# Patient Record
Sex: Female | Born: 1970 | Hispanic: No | Marital: Married | Smoking: Never smoker
Health system: Southern US, Community
[De-identification: ages and names within clinical notes are randomized; demographics above are authoritative.]

## PROBLEM LIST (undated history)

## (undated) ENCOUNTER — Ambulatory Visit (HOSPITAL_COMMUNITY): Admission: EM | Payer: Self-pay | Source: Home / Self Care

## (undated) DIAGNOSIS — R0602 Shortness of breath: Secondary | ICD-10-CM

## (undated) DIAGNOSIS — I881 Chronic lymphadenitis, except mesenteric: Secondary | ICD-10-CM

## (undated) DIAGNOSIS — E049 Nontoxic goiter, unspecified: Secondary | ICD-10-CM

## (undated) DIAGNOSIS — M130 Polyarthritis, unspecified: Secondary | ICD-10-CM

## (undated) DIAGNOSIS — A1889 Tuberculosis of other sites: Secondary | ICD-10-CM

## (undated) DIAGNOSIS — D649 Anemia, unspecified: Secondary | ICD-10-CM

## (undated) HISTORY — DX: Anemia, unspecified: D64.9

## (undated) HISTORY — PX: BACK SURGERY: SHX140

## (undated) HISTORY — DX: Nontoxic goiter, unspecified: E04.9

## (undated) HISTORY — DX: Polyarthritis, unspecified: M13.0

---

## 2012-01-25 ENCOUNTER — Telehealth: Payer: Self-pay | Admitting: Hematology and Oncology

## 2012-01-25 NOTE — Telephone Encounter (Signed)
S/w pt's husband today re appt for 2/26 @ 10 am w/LO. Message sent to amy/tiffany informing them of appt d/t and that LO has no more availability until wk of 2/26. If this is too long for pt to wait amy/tiffany will s/w LO or change physician as I will be out until 2/19.

## 2012-01-31 ENCOUNTER — Telehealth: Payer: Self-pay | Admitting: Hematology and Oncology

## 2012-01-31 NOTE — Telephone Encounter (Signed)
Referred by Dr. Allyne Gee Dx-Leukopenia

## 2012-02-07 ENCOUNTER — Telehealth: Payer: Self-pay | Admitting: Hematology and Oncology

## 2012-02-07 ENCOUNTER — Ambulatory Visit (HOSPITAL_BASED_OUTPATIENT_CLINIC_OR_DEPARTMENT_OTHER): Payer: Managed Care, Other (non HMO)

## 2012-02-07 ENCOUNTER — Ambulatory Visit: Payer: Managed Care, Other (non HMO) | Admitting: Lab

## 2012-02-07 ENCOUNTER — Encounter: Payer: Self-pay | Admitting: Hematology and Oncology

## 2012-02-07 ENCOUNTER — Ambulatory Visit (HOSPITAL_BASED_OUTPATIENT_CLINIC_OR_DEPARTMENT_OTHER): Payer: Managed Care, Other (non HMO) | Admitting: Hematology and Oncology

## 2012-02-07 VITALS — BP 117/85 | HR 83 | Temp 97.8°F | Ht 61.0 in | Wt 120.0 lb

## 2012-02-07 DIAGNOSIS — D72819 Decreased white blood cell count, unspecified: Secondary | ICD-10-CM

## 2012-02-07 DIAGNOSIS — E559 Vitamin D deficiency, unspecified: Secondary | ICD-10-CM | POA: Insufficient documentation

## 2012-02-07 DIAGNOSIS — E049 Nontoxic goiter, unspecified: Secondary | ICD-10-CM

## 2012-02-07 HISTORY — DX: Nontoxic goiter, unspecified: E04.9

## 2012-02-07 LAB — CBC WITH DIFFERENTIAL/PLATELET
Basophils Absolute: 0 10*3/uL (ref 0.0–0.1)
EOS%: 3.6 % (ref 0.0–7.0)
Eosinophils Absolute: 0.1 10*3/uL (ref 0.0–0.5)
HCT: 36.5 % (ref 34.8–46.6)
HGB: 12.6 g/dL (ref 11.6–15.9)
MONO#: 0.3 10*3/uL (ref 0.1–0.9)
NEUT#: 1.5 10*3/uL (ref 1.5–6.5)
NEUT%: 53.4 % (ref 38.4–76.8)
RDW: 13.6 % (ref 11.2–14.5)
lymph#: 0.9 10*3/uL (ref 0.9–3.3)

## 2012-02-07 LAB — MORPHOLOGY: PLT EST: ADEQUATE

## 2012-02-07 NOTE — Telephone Encounter (Signed)
Gv pt appt for [phone]

## 2012-02-07 NOTE — Patient Instructions (Addendum)
Reviewed CBC which are unremarkable. Continue taking vit D3 OTC and Multivitamin and f/u with PCP.  Schedule f/u in one years time

## 2012-02-07 NOTE — Progress Notes (Signed)
CC:   Robyn N. Allyne Gee, M.D.  REFERRING PHYSICIAN:  Robyn N. Allyne Gee, M.D.  IDENTIFYING PATIENT:  The patient is a 41 year old woman with seen at the request of doctor's sanders with leukopenia.  HISTORY OF PRESENT ILLNESS:  The patient is non-English speaking, from [place], who is here with her husband who acts as a Nurse, learning disability.  She has a past medical history, having been treated for a benign thyroid node nodule (adenoid goiter) in 2010.  She is not on any hormonal replacement therapy.  During a routine physical, she had lab work drawn at Dr. Zella Ball office and was felt to be leukopenic.  CBC obtained 01/13/2012 noted white count of 2.4, hemoglobin 12.8, hematocrit 37.1, and platelets 257.  The patient does not recall having ever been told that she had a problem with her blood counts.  She has not recorded any history of fever, chills, or night sweats.  She is not prone to malaria. She states she eats a well-balanced diet.  She is not taking any over- the-counter medications or herbal remedies.  She denies a history of fever, chills, or night sweats.  MEDICATIONS:  She was taking vitamin D3 and Macrobid.  ALLERGIES:  None.  SOCIAL HISTORY:  Denies alcohol, tobacco use.  She has no children.  She is a housewife.  FAMILY HISTORY:  Negative for oncologic or hematologic malignancies.  REVIEW OF SYSTEMS:  Denies fever, chills, night sweats, anorexia, or weight loss.  GI: Denies nausea, vomiting, abdominal pain, diarrhea, melena, or hematochezia.  GU: Denies dysuria, hematuria, nocturia, or frequency.  Cardiovascular: Denies chest pain, PND, orthopnea, or ankle swelling.  : Respirations denies cough, hemoptysis, wheeze, or shortness of breath.  Skin: No bruising or bleeding.  Lymph nodes: No swelling. Musculoskeletal: Denies joint aches, muscle pains.  CNS: Denies headaches, vision changes, or extremity weakness.  The rest of review of systems negative.  PHYSICAL EXAM:  General: The  patient is a well-appearing, well- nourished, woman is woman in no distress.  Vitals: Pulse 83, blood pressure 107/85, temperature 97.8, respirations 20, and 120 pounds. HEENT: Head is atraumatic, normocephalic.  Sclerae are anicteric.  Mouth moist without ulcerations, thrush, or lesions.  Neck:  Supple without adenopathy.  Chest: Demonstrates a good entry bilaterally.  Clear to both percussion and auscultation.  Abdomen:  Soft, nontender.  No hepatomegaly.  No masses.  Bowel sounds present.  Extremities:  Without edema.  Pulses present and symmetrical.  Skin: No bleeding.  CNS: Nonfocal.  LAB DATA:  The patient returned to the lab and obtained a CBC that notes a white blood cell count of 2.7, hemoglobin 12.6, hematocrit 36.5, platelets 260, and ANC 1500.  Review morphology was unremarkable.  I also reviewed with her additional lab results obtained from Dr. Zella Ball office. Specifically, a normal vitamin B12 of 552. vitamin D was low at 8.8.  TSH 1.080, free T4 1.13, alkaline phosphatase 67, ALT 9, AST 16, total and bilirubin 0.3.  BUN and creatinine 9 and 0.62, respectively, and glucose 75.  IMPRESSION AND PLAN:  Ms. Heward is a 41 year old woman with a mild leukopenia. She has adequate neutrophils of 1500.  Morphology was unremarkable.  So, with this said, she is reassured, and for the time being surveillance is all that is required. So she follows up in a year's time with a CBC.  In the interim, she is to follow up with Dr. Allyne Gee for other medical problems.     ______________________________ Laurice Record, M.D. LIO/MEDQ  D:  02/07/2012  T:  02/07/2012  Job:  [id number]

## 2012-02-07 NOTE — Progress Notes (Signed)
This office note has been dictated.

## 2012-04-30 ENCOUNTER — Encounter: Payer: Self-pay | Admitting: Internal Medicine

## 2012-04-30 ENCOUNTER — Ambulatory Visit (INDEPENDENT_AMBULATORY_CARE_PROVIDER_SITE_OTHER): Payer: BC Managed Care – PPO | Admitting: Internal Medicine

## 2012-04-30 VITALS — BP 111/75 | HR 69 | Temp 98.0°F | Resp 16 | Ht 61.5 in | Wt 122.4 lb

## 2012-04-30 DIAGNOSIS — R102 Pelvic and perineal pain: Secondary | ICD-10-CM

## 2012-04-30 DIAGNOSIS — N949 Unspecified condition associated with female genital organs and menstrual cycle: Secondary | ICD-10-CM

## 2012-04-30 DIAGNOSIS — R300 Dysuria: Secondary | ICD-10-CM

## 2012-04-30 DIAGNOSIS — N912 Amenorrhea, unspecified: Secondary | ICD-10-CM

## 2012-04-30 LAB — POCT URINALYSIS DIPSTICK
Bilirubin, UA: NEGATIVE
Glucose, UA: NEGATIVE
Nitrite, UA: NEGATIVE
Spec Grav, UA: 1.01

## 2012-04-30 LAB — POCT UA - MICROSCOPIC ONLY: Yeast, UA: NEGATIVE

## 2012-04-30 LAB — TSH: TSH: 0.836 u[IU]/mL (ref 0.350–4.500)

## 2012-04-30 LAB — HCG, QUANTITATIVE, PREGNANCY: hCG, Beta Chain, Quant, S: <2 m[IU]/mL

## 2012-04-30 NOTE — Progress Notes (Signed)
  Subjective:    Patient ID: Nichole Perry, female    DOB: 1971-03-19, 41 y.o.   MRN: [id number]  HPI amenorrhea since January 26 Feels abdomen swelling Also c/o mild dysuria Am nausea  her weight is  unchanged. No vaginal discharge Home preg test and it was negative. Pt is muslim is here with husband who is her interpreter   Review of Systems  Constitutional: Positive for activity change and appetite change.  HENT: Negative.   Eyes: Negative.   Respiratory: Negative.   Cardiovascular: Negative.   Gastrointestinal: Positive for abdominal distention.  Genitourinary: Positive for dysuria.  Musculoskeletal: Negative.   Skin: Negative.   Neurological: Negative.   Hematological: Negative.   Psychiatric/Behavioral: Negative.   All other systems reviewed and are negative.       Objective:   Physical Exam  Nursing note and vitals reviewed. Constitutional: She is oriented to person, place, and time. She appears well-developed and well-nourished.  HENT:  Head: Normocephalic and atraumatic.  Right Ear: External ear normal.  Left Ear: External ear normal.  Eyes: Conjunctivae are normal. Pupils are equal, round, and reactive to light.  Neck: Normal range of motion. Neck supple. Thyromegaly present.  Cardiovascular: Normal rate and regular rhythm.   Pulmonary/Chest: Effort normal and breath sounds normal.  Abdominal: Soft. Bowel sounds are normal. She exhibits distension. There is no tenderness. There is no rebound and no guarding.  Musculoskeletal: Normal range of motion.  Neurological: She is alert and oriented to person, place, and time.  Skin: Skin is warm and dry.  Psychiatric: She has a normal mood and affect. Her behavior is normal. Judgment and thought content normal.      Results for orders placed in visit on 04/30/12  POCT URINE PREGNANCY      Component Value Range   Preg Test, Ur Negative    POCT URINALYSIS DIPSTICK      Component Value Range   Color, UA yellow      Clarity, UA clear     Glucose, UA negative     Bilirubin, UA negative     Ketones, UA negative     Spec Grav, UA 1.010     Blood, UA trace-lysed     pH, UA 5.5     Protein, UA negative     Urobilinogen, UA 0.2     Nitrite, UA negative     Leukocytes, UA Negative    POCT UA - MICROSCOPIC ONLY      Component Value Range   WBC, Ur, HPF, POC 0-1     RBC, urine, microscopic 0-2     Bacteria, U Microscopic 1+     Mucus, UA negative     Epithelial cells, urine per micros 0-2     Crystals, Ur, HPF, POC negative     Casts, Ur, LPF, POC negative     Yeast, UA negative    neg preg and ua    Assessment & Plan:  differential diagnosis includes pregnancy /thyroid dysfunction/pelvic mass. Will check preg test and ua.  preg test is negitive and ua is negitive for infection .Will do a b hcg and tsh and pelvic ultrasound

## 2012-05-01 ENCOUNTER — Other Ambulatory Visit: Payer: Self-pay | Admitting: Internal Medicine

## 2012-05-01 DIAGNOSIS — N912 Amenorrhea, unspecified: Secondary | ICD-10-CM

## 2012-05-04 ENCOUNTER — Ambulatory Visit (HOSPITAL_COMMUNITY)
Admission: RE | Admit: 2012-05-04 | Discharge: 2012-05-04 | Disposition: A | Discharge status: Home / Self Care | Payer: BC Managed Care – PPO | Source: Ambulatory Visit | Attending: Internal Medicine | Admitting: Internal Medicine

## 2012-05-04 DIAGNOSIS — N83209 Unspecified ovarian cyst, unspecified side: Secondary | ICD-10-CM | POA: Insufficient documentation

## 2012-05-04 DIAGNOSIS — N912 Amenorrhea, unspecified: Secondary | ICD-10-CM | POA: Insufficient documentation

## 2012-05-23 ENCOUNTER — Telehealth: Payer: Self-pay

## 2012-05-23 NOTE — Telephone Encounter (Signed)
Mildly complex left ovarian cyst.   Initial short-term follow-up is recommended with reassessment in 6-8 weeks to evaluate for persistence of this finding and assess for the need for further Characterization.  If her symptoms persist in the meantime, RTC.

## 2012-05-23 NOTE — Telephone Encounter (Signed)
Can you please review the results of the ultrasound and let us know what to tell the patient?

## 2012-05-23 NOTE — Telephone Encounter (Signed)
OMER STATES HE HAD CALLED THE OTHER DAY REGARDING HIS WIFE'S ULTRA SOUND AND HASN'T HEARD FROM ANYONE PLEASE CALL [phone]

## 2012-05-24 NOTE — Telephone Encounter (Signed)
Explained results to pt's husband and instr's for f/up. He agreed to RTC in about 6 wks or sooner if Sxs persist/worsen.

## 2012-12-15 ENCOUNTER — Encounter: Payer: Self-pay | Admitting: Oncology

## 2012-12-15 ENCOUNTER — Telehealth: Payer: Self-pay | Admitting: Oncology

## 2012-12-15 NOTE — Telephone Encounter (Signed)
s.w. pt and tried to advise pt of being r/s not good english pt requested that i call back and lvm so that her husband could understand...printed appt schedule and letter...former pt of Dr. Marton Redwood reassigned to Dr. Arline Asp

## 2013-02-06 ENCOUNTER — Ambulatory Visit: Payer: Managed Care, Other (non HMO) | Admitting: Hematology and Oncology

## 2013-02-06 ENCOUNTER — Other Ambulatory Visit: Payer: Self-pay | Admitting: Oncology

## 2013-02-06 ENCOUNTER — Other Ambulatory Visit: Payer: Managed Care, Other (non HMO) | Admitting: Lab

## 2013-02-06 DIAGNOSIS — D72819 Decreased white blood cell count, unspecified: Secondary | ICD-10-CM

## 2013-02-07 ENCOUNTER — Ambulatory Visit: Payer: Managed Care, Other (non HMO) | Admitting: Oncology

## 2013-02-07 ENCOUNTER — Other Ambulatory Visit: Payer: Managed Care, Other (non HMO) | Admitting: Lab

## 2013-02-07 ENCOUNTER — Other Ambulatory Visit: Payer: Self-pay

## 2013-02-08 ENCOUNTER — Telehealth: Payer: Self-pay | Admitting: Oncology

## 2013-02-08 NOTE — Telephone Encounter (Signed)
lmonvm adviisng the pt of her march 2014 appts for the lab and md visit. Pt missed ehr feb appts

## 2013-03-04 ENCOUNTER — Other Ambulatory Visit: Payer: Self-pay | Admitting: Oncology

## 2013-03-05 ENCOUNTER — Other Ambulatory Visit (HOSPITAL_BASED_OUTPATIENT_CLINIC_OR_DEPARTMENT_OTHER): Payer: BC Managed Care – PPO

## 2013-03-05 ENCOUNTER — Encounter: Payer: Self-pay | Admitting: Medical Oncology

## 2013-03-05 ENCOUNTER — Ambulatory Visit (HOSPITAL_BASED_OUTPATIENT_CLINIC_OR_DEPARTMENT_OTHER): Payer: BC Managed Care – PPO | Admitting: Oncology

## 2013-03-05 ENCOUNTER — Encounter: Payer: Self-pay | Admitting: Oncology

## 2013-03-05 VITALS — BP 122/81 | HR 72 | Temp 97.2°F | Resp 20 | Ht 61.5 in | Wt 124.6 lb

## 2013-03-05 DIAGNOSIS — E041 Nontoxic single thyroid nodule: Secondary | ICD-10-CM

## 2013-03-05 DIAGNOSIS — D72819 Decreased white blood cell count, unspecified: Secondary | ICD-10-CM

## 2013-03-05 LAB — CBC WITH DIFFERENTIAL/PLATELET
BASO%: 0.8 % (ref 0.0–2.0)
EOS%: 2.1 % (ref 0.0–7.0)
HCT: 36.8 % (ref 34.8–46.6)
LYMPH%: 39.8 % (ref 14.0–49.7)
MCH: 29.6 pg (ref 25.1–34.0)
MCHC: 35 g/dL (ref 31.5–36.0)
NEUT%: 46.9 % (ref 38.4–76.8)
Platelets: 225 10*3/uL (ref 145–400)

## 2013-03-05 LAB — COMPREHENSIVE METABOLIC PANEL (CC13)
ALT: 10 U/L (ref 0–55)
AST: 14 U/L (ref 5–34)
Alkaline Phosphatase: 70 U/L (ref 40–150)
Creatinine: 0.6 mg/dL (ref 0.6–1.1)
Total Bilirubin: <0.2 mg/dL (ref 0.20–1.20)

## 2013-03-05 NOTE — Progress Notes (Signed)
Faxed office notes,labs from 03/05/13  and demographics to Dr. Dorothyann Peng. Dr. Arline Asp spoke with Dr. Allyne Gee today regarding pt's thyroid.

## 2013-03-05 NOTE — Progress Notes (Signed)
This office note has been dictated.  #[id number]

## 2013-03-05 NOTE — Progress Notes (Signed)
CC:   Robyn N. Allyne Gee, M.D.  PROBLEM LIST: 1. Leukopenia apparently first noted 1 year ago.  CBC on 03/05/2013:     White count 3.2, ANC 1.5, hemoglobin 12.9, hematocrit 36.8,     platelets 225,000.  This is felt to be a benign leukopenia. 2. Left lobe thyroid nodule apparently first noted in 2010.  The     patient underwent a workup either in the [place] or [place] but we do     not have any of the details of this.  Apparently she may have had a     needle biopsy. 3. History of vitamin D deficiency. 4. Infertility.  MEDICATIONS:  The patient is not on any medicines at this time.  SMOKING HISTORY:  The patient has never smoked cigarettes.  HISTORY:  I am seeing this patient for the first time.  She was seen in initial consultation by Dr. Arlan Organ on 02/07/2012 for evaluation of leukopenia.  Workup at that time including inspection of the peripheral smear was basically negative.  The patient had a normal vitamin B12 level, as well as normal thyroid function tests.  Vitamin D level apparently was 8.8.  No further workup was recommended other than just surveillance and the patient was given a return appointment for today.  In the interim apparently there have been no medical issues. The patient does not speak very much [place].  She is here today with her husband, Elaina Hoops.  He works at [employer].  He drives.  The patient was really without any complaints.  There was some concern about infertility.  Apparently they do not have any children.  Apparently they have been in the [place] for about 10 years.  Communication is through the husband.  There have been no problems with any infections.  PHYSICAL EXAMINATION:  The patient looks well.  She looks somewhat younger than her stated age of 59.  Weight is 124 pounds 9.6 ounces. Height 5 feet 1-1/2 inches.  Body surface area 1.57 sq m.  Blood pressure 122/81.  Other vital signs are normal.  There is no scleral icterus.   Mouth and pharynx are benign.  No adenopathy; however, in the left lobe of the thyroid there is a very prominent, protuberant, somewhat fluctuant smooth lesion measuring about 3.5 cm in diameter. Apparently by history this has not changed.  No axillary adenopathy. Heart and lungs are normal.  Abdomen soft, nontender with no organomegaly or masses palpable.  Extremities, no peripheral edema or clubbing.  No arthritic changes.  The patient has a well-healed scar over her lumbar spine.  Neurologic exam was normal.  LABORATORY DATA:  Today, white count 3.2, ANC 1.5, hemoglobin 12.9, hematocrit 36.8, platelets 225,000.  Red cell indices were normal. Chemistries were normal except for an albumin of 3.4.  LDH was 170.  TSH from 04/30/2012 was 0.836.  IMPRESSION AND PLAN:  I believe that the patient's mild leukopenia is a benign finding.  I agree with Dr. Dalene Carrow that this simply can be followed.  I do not think this requires any further workup at this time. Certainly if there were any significant changes in the rest of the patient's blood counts or any other findings to suggest a primary hematologic disorder then we would be happy to see the patient again.  My biggest concern is the large left thyroid nodule involving the left lobe.  I spoke with Dr. Allyne Gee.  She will contact the patient and review information that is available and  perhaps pursue other diagnostic studies to include an ultrasound, thyroid scan, perhaps referral for biopsy or surgical excision as appropriate.  I noted that the patient apparently had a low vitamin D level in the past.  She is currently not on vitamin D at this time although I believe she was taking vitamin D a year ago.  In addition, the patient's husband mentioned his concerns about fertility.  The patient may need a referral to explore the issue of infertility.  I will be happy to see the patient again should any questions or concerns arise.  A return  appointment was not made at this time.    ______________________________ Samul Dada, M.D. DSM/MEDQ  D:  03/05/2013  T:  03/05/2013  Job:  [id number]

## 2013-05-02 ENCOUNTER — Ambulatory Visit (INDEPENDENT_AMBULATORY_CARE_PROVIDER_SITE_OTHER): Payer: BC Managed Care – PPO | Admitting: Obstetrics & Gynecology

## 2013-05-02 ENCOUNTER — Encounter: Payer: Self-pay | Admitting: Obstetrics & Gynecology

## 2013-05-02 VITALS — BP 116/74 | HR 88 | Temp 98.3°F | Ht 62.0 in | Wt 120.0 lb

## 2013-05-02 DIAGNOSIS — N83209 Unspecified ovarian cyst, unspecified side: Secondary | ICD-10-CM

## 2013-05-02 DIAGNOSIS — N979 Female infertility, unspecified: Secondary | ICD-10-CM

## 2013-05-02 NOTE — Progress Notes (Signed)
Subjective:    Nichole Perry is a 42 y.o. female who presents for evaluation of infertility. Patient and partner have been attempting conception for 3 years. Marital status: married for 3 years. Pregnancies with current partner: no.  Menstrual and Endocrine History LMP Patient's last menstrual period was 04/25/2013.  Menarche 14  Shortest interval 30  Longest interval 30  days  Duration of flow 7 days  Heavy menses yes  Clots yes  Intermenstrual bleeding no  Postcoital bleeding no  Dysmenorrhea yes  Amenorrhea not applicable  Weight change unknown  Hirsutism no  Balding no  Acne no  Galactorrhea no   Obstetrical History Never pregnant  Gynecologic History Last PAP 2013  Previous abdominal or pelvic surgery no  Pelvic pain no  Endometriosis no  Hot flashes no  DES exposure unknown  Abnormal Pap no  Cervix Cryo/cone no  Sexually transmitted diseases no  Pelvic inflammatory disease no   Infertility and Endocrine Studies Basal body temperature no  Endo with biopsy no  Hysterosalpingogram no  Post-coital test no  Laparoscopy no  Hormonal studies unknown  Semen analysis no  Other studies no  Medications none  Other therapies Not applicable  Insemination not applicable   Sexual History Frequency 2 or 3 times per week  Satisfied yes  Dyspareunia no  Use of lubricant no  Douching no  Number of lifetime sex partners 1   Contraception None  Family History Thyroid problems  yes  Heart condition or high blood pressure  no  Blood clot or stroke  no  Diabetes  yes  Cancer  no  Birth defects/inherited diseases  no  Infectious diseases (mumps, TB, rubella)  no  Other medical problems  hypertension   Habits Cigarettes:    Wife -  no    Husband - no Alcohol:    Wife -  no    Husband - no Marijuana:   Wife - no   Husband - no  The following portions of the patient's history were reviewed and updated as appropriate: allergies, current medications, past  family history, past medical history, past social history, past surgical history and problem list.  Review of Systems Pertinent items are noted in HPI.  Female History and Exam Name:  Nichole Perry     Age: 41 Education: 12  Marital History: married # of years with this partner: 3 # of partners: 1  Paternity of Pregnancies: Number with this partner: 0 Number with other partners: 2 Age of youngest child: 13 years  Urologic History: Infection unknown  STD no  Mumps unknown  Varicocele no  Semen analysis unknown  Undescended testes no  Testicular trauma no  Genital surgery no  Ejaculatory problem no  Impotence no         Objective:    Female Exam BP 116/74  Pulse 88  Temp(Src) 98.3 F (36.8 C) (Oral)  Ht 5\' 2"  (1.575 m)  Wt 120 lb (54.432 kg)  BMI 21.94 kg/m2  LMP 04/25/2013 Wt Readings from Last 1 Encounters:  05/02/13 120 lb (54.432 kg)   BMI: Body mass index is 21.94 kg/(m^2). No exam today  Pelvic U/S 5/13: IMPRESSION:  Normal uterine myometrium, endometrium and right ovary.  Mildly complex left ovarian cyst. This contains a solitary thin  internal septation which does have some flow within it but no other  worrisome features are evident. Initial short-term follow-up is  recommended with reassessment in 6-8 weeks to evaluate for  persistence of this finding and  assess for the need for further  characterization.  Assessment:    Primary infertility due  H/O likely functional ovarian cyst Plan:    Basal body temperature, Progesterone and AMH.  Repeat pelvic U/S Return in a few weeks

## 2013-05-03 ENCOUNTER — Other Ambulatory Visit: Payer: Self-pay | Admitting: *Deleted

## 2013-05-03 ENCOUNTER — Encounter: Payer: Self-pay | Admitting: Obstetrics & Gynecology

## 2013-05-03 DIAGNOSIS — N979 Female infertility, unspecified: Secondary | ICD-10-CM | POA: Insufficient documentation

## 2013-05-03 DIAGNOSIS — N83209 Unspecified ovarian cyst, unspecified side: Secondary | ICD-10-CM | POA: Insufficient documentation

## 2013-05-03 NOTE — Patient Instructions (Signed)
Infertility WHAT IS INFERTILITY?  Infertility is usually defined as not being able to get pregnant after trying for one year of regular sexual intercourse without the use of contraceptives. Or not being able to carry a pregnancy to term and have a baby. The infertility rate in the [place] is around 10%. Pregnancy is the result of a chain of events. A woman must release an egg from one of her ovaries (ovulation). The egg must be fertilized by the female sperm. Then it travels through a fallopian tube into the uterus (womb), where it attaches to the wall of the uterus and grows. A man must have enough sperm, and the sperm must join with (fertilize) the egg along the way, at the proper time. The fertilized egg must then become attached to the inside of the uterus. While this may seem simple, many things can happen to prevent pregnancy from occurring.  WHOSE PROBLEM IS IT?  About 20% of infertility cases are due to problems with the man (female factors) and 65% are due to problems with the woman (female factors). Other cases are due to a combination of female and female factors or to unknown causes.  WHAT CAUSES INFERTILITY IN MEN?  Infertility in men is often caused by problems with making enough normal sperm or getting the sperm to reach the egg. Problems with sperm may exist from birth or develop later in life, due to illness or injury. Some men produce no sperm, or produce too few sperm (oligospermia). Other problems include:  Sexual dysfunction.  Hormonal or endocrine problems.  Age. Female fertility decreases with age, but not at as young an age as female fertility.  Infection.  Congenital problems. Birth defect, such as absence of the tubes that carry the sperm (vas deferens).  Genetic/chromosomal problems.  Antisperm antibody problems.  Retrograde ejaculation (sperm go into the bladder).  Varicoceles, spematoceles, or tumors of the testicles.  Lifestyle can influence the number and  quality of a man's sperm.  Alcohol and drugs can temporarily reduce sperm quality.  Environmental toxins, including pesticides and lead, may cause some cases of infertility in men. WHAT CAUSES INFERTILITY IN WOMEN?   Problems with ovulation account for most infertility in women. Without ovulation, eggs are not available to be fertilized.  Signs of problems with ovulation include irregular menstrual periods or no periods at all.  Simple lifestyle factors, including stress, diet, or athletic training, can affect a woman's hormonal balance.  Age. Fertility begins to decrease in women in the early 30s and is worse after age 37.  Much less often, a hormonal imbalance from a serious medical problem, such as a pituitary gland tumor, thyroid or other chronic medical disease, can cause ovulation problems.  Pelvic infections.  Polycystic ovary syndrome (increase in female hormones, unable to ovulate).  Alcohol or illegal drugs.  Environmental toxins, radiation, pesticides, and certain chemicals.  Aging is an important factor in female infertility.  The ability of a woman's ovaries to produce eggs declines with age, especially after age 35. About one third of couples where the woman is over 35 will have problems with fertility.  By the time she reaches menopause when her monthly periods stop for good, a woman can no longer produce eggs or become pregnant.  Other problems can also lead to infertility in women. If the fallopian tubes are blocked at one or both ends, the egg cannot travel through the tubes into the uterus. Scar tissue (adhesions) in the pelvis may cause blocked   tubes. This may result from pelvic inflammatory disease, endometriosis, or surgery for an ectopic pregnancy (fertilized egg implanted outside the uterus) or any pelvic or abdominal surgery causing adhesions.  Fibroid tumors or polyps of the uterus.  Congenital (birth defect) abnormalities of the uterus.  Infection of the  cervix (cervicitis).  Cervical stenosis (narrowing).  Abnormal cervical mucus.  Polycystic ovary syndrome.  Having sexual intercourse too often (every other day or 4 to 5 times a week).  Obesity.  Anorexia.  Poor nutrition.  Over exercising, with loss of body fat.  DES. Your mother received diethylstilbesterol hormone when pregnant with you. HOW IS INFERTILITY TESTED?  If you have been trying to have a baby without success, you may want to seek medical help. You should not wait for one year of trying before seeing a health care provider if:  You are over 35.  You have reason to believe that there may be a fertility problem. A medical evaluation may determine the reasons for a couple's infertility. Usually this process begins with:  Physical exams.  Medical histories of both partners.  Sexual histories of both partners. If there is no obvious problem, like improperly timed intercourse or absence of ovulation, tests may be needed.   For a man, testing usually begins with tests of his semen to look at:  The number of sperm.  The shape of sperm.  Movement of his sperm.  Taking a complete medical and surgical history.  Physical examination.  Check for infection of the female reproductive organs. Sometimes hormone tests are done.   For a woman, the first step in testing is to find out if she is ovulating each month. There are several ways to do this. For example, she can keep track of changes in her morning body temperature and in the texture of her cervical mucus. Another tool is a home ovulation test kit, which can be bought at drug or grocery stores.  Checks of ovulation can also be done in the doctor's office, using blood tests for hormone levels or ultrasound tests of the ovaries. If the woman is ovulating, more tests will need to be done. Some common female tests include:  Hysterosalpingogram: An x-ray of the fallopian tubes and uterus after they are injected with  dye. It shows if the tubes are open and shows the shape of the uterus.  Laparoscopy: An exam of the tubes and other female organs for disease. A lighted tube called a laparoscope is used to see inside the abdomen.  Endometrial biopsy: Sample of uterus tissue taken on the first day of the menstrual period, to see if the tissue indicates you are ovulating.  Transvaginal ultrasound: Examines the female organs.  Hysteroscopy: Uses a lighted tube to examine the cervix and inside the uterus, to see if there are any abnormalities inside the uterus. TREATMENT  Depending on the test results, different treatments can be suggested. The type of treatment depends on the cause. 85 to 90% of infertility cases are treated with drugs or surgery.   Various fertility drugs may be used for women with ovulation problems. It is important to talk with your caregiver about the drug to be used. You should understand the drug's benefits and side effects. Depending on the type of fertility drug and the dosage of the drug used, multiple births (twins or multiples) can occur in some women.  If needed, surgery can be done to repair damage to a woman's ovaries, fallopian tubes, cervix, or uterus.  Surgery   or medical treatment for endometriosis or polycystic ovary syndrome. Sometimes a man has an infertility problem that can be corrected with medicine or by surgery.  Intrauterine insemination (IUI) of sperm, timed with ovulation.  Change in lifestyle, if that is the cause (lose weight, increase exercise, and stop smoking, drinking excessively, or taking illegal drugs).  Other types of surgery:  Removing growths inside and on the uterus.  Removing scar tissue from inside of the uterus.  Fixing blocked tubes.  Removing scar tissue in the pelvis and around the female organs. WHAT IS ASSISTED REPRODUCTIVE TECHNOLOGY (ART)?  Assisted reproductive technology (ART) is another form of special methods used to help infertile  couples. ART involves handling both the woman's eggs and the man's sperm. Success rates vary and depend on many factors. ART can be expensive and time-consuming. But ART has made it possible for many couples to have children that otherwise would not have been conceived. Some methods are listed below:  In vitro fertilization (IVF). IVF is often used when a woman's fallopian tubes are blocked or when a man has low sperm counts. A drug is used to stimulate the ovaries to produce multiple eggs. Once mature, the eggs are removed and placed in a culture dish with the man's sperm for fertilization. After about 40 hours, the eggs are examined to see if they have become fertilized by the sperm and are dividing into cells. These fertilized eggs (embryos) are then placed in the woman's uterus. This bypasses the fallopian tubes.  Gamete intrafallopian transfer (GIFT) is similar to IVF, but used when the woman has at least one normal fallopian tube. Three to five eggs are placed in the fallopian tube, along with the man's sperm, for fertilization inside the woman's body.  Zygote intrafallopian transfer (ZIFT), also called tubal embryo transfer, combines IVF and GIFT. The eggs retrieved from the woman's ovaries are fertilized in the lab and placed in the fallopian tubes rather than in the uterus.  ART procedures sometimes involve the use of donor eggs (eggs from another woman) or previously frozen embryos. Donor eggs may be used if a woman has impaired ovaries or has a genetic disease that could be passed on to her baby.  When performing ART, you are at higher risk for resulting in multiple pregnancies, twins, triplets or more.  Intracytoplasma sperm injection is a procedure that injects a single sperm into the egg to fertilize it.  Embryo transplant is a procedure that starts after growing an embryo in a special media (chemical solution) developed to keep the embryo alive for 2 to 5 days, and then transplanting it  into the uterus. In cases where a cause cannot be found and pregnancy does not occur, adoption may be a consideration. Document Released: 12/01/2003 Document Revised: 02/20/2012 Document Reviewed: 10/27/2009 [employer] Patient Information 2014 [employer].  

## 2013-05-06 ENCOUNTER — Encounter: Payer: Self-pay | Admitting: Obstetrics & Gynecology

## 2013-05-13 ENCOUNTER — Other Ambulatory Visit: Payer: Self-pay | Admitting: *Deleted

## 2013-05-13 DIAGNOSIS — N83209 Unspecified ovarian cyst, unspecified side: Secondary | ICD-10-CM

## 2013-05-14 ENCOUNTER — Other Ambulatory Visit: Payer: BC Managed Care – PPO

## 2013-05-15 ENCOUNTER — Other Ambulatory Visit: Payer: BC Managed Care – PPO

## 2013-05-30 ENCOUNTER — Ambulatory Visit: Payer: BC Managed Care – PPO | Admitting: Obstetrics & Gynecology

## 2013-06-20 ENCOUNTER — Ambulatory Visit: Payer: BC Managed Care – PPO | Admitting: Obstetrics & Gynecology

## 2013-06-24 ENCOUNTER — Ambulatory Visit (INDEPENDENT_AMBULATORY_CARE_PROVIDER_SITE_OTHER): Payer: BC Managed Care – PPO | Admitting: Obstetrics & Gynecology

## 2013-06-24 ENCOUNTER — Encounter: Payer: Self-pay | Admitting: Obstetrics & Gynecology

## 2013-06-24 VITALS — BP 124/85 | HR 63 | Wt 116.0 lb

## 2013-06-24 DIAGNOSIS — IMO0002 Reserved for concepts with insufficient information to code with codable children: Secondary | ICD-10-CM

## 2013-06-24 DIAGNOSIS — N979 Female infertility, unspecified: Secondary | ICD-10-CM

## 2013-06-24 DIAGNOSIS — Z3202 Encounter for pregnancy test, result negative: Secondary | ICD-10-CM

## 2013-06-24 LAB — POCT URINE PREGNANCY: Preg Test, Ur: NEGATIVE

## 2013-06-24 NOTE — Progress Notes (Signed)
Subjective:     Nichole Perry is a 42 y.o. female here for a routine exam.  Current complaints: follow up for infertility. Pt has not had her blood work or ultrasound performed at this time. Pt states she has did her basal body temperature chart for approximately 2 weeks.   Personal health questionnaire reviewed: yes.   Gynecologic History Patient's last menstrual period was 05/23/2013. Contraception: none Last Pap: 2014. Results were: normal Last mammogram: n/a. Results were: n/a  Obstetric History OB History   Grav Para Term Preterm Abortions TAB SAB Ect Mult Living   0 0 0 0 0 0 0 0 0 0        The following portions of the patient's history were reviewed and updated as appropriate: allergies, current medications, past family history, past medical history, past social history, past surgical history and problem list.  Review of Systems Pertinent items are noted in HPI.    Objective:   No exam today  Assessment:    Primary infertility  Plan:   Basic infertility work-up Return after labs/U/S

## 2013-07-02 ENCOUNTER — Ambulatory Visit (HOSPITAL_COMMUNITY)
Admission: RE | Admit: 2013-07-02 | Discharge: 2013-07-02 | Disposition: A | Discharge status: Home / Self Care | Payer: BC Managed Care – PPO | Source: Ambulatory Visit | Attending: Obstetrics & Gynecology | Admitting: Obstetrics & Gynecology

## 2013-07-02 DIAGNOSIS — N979 Female infertility, unspecified: Secondary | ICD-10-CM | POA: Insufficient documentation

## 2013-07-02 DIAGNOSIS — D251 Intramural leiomyoma of uterus: Secondary | ICD-10-CM | POA: Insufficient documentation

## 2013-07-02 DIAGNOSIS — N83209 Unspecified ovarian cyst, unspecified side: Secondary | ICD-10-CM | POA: Insufficient documentation

## 2014-12-05 ENCOUNTER — Encounter (HOSPITAL_COMMUNITY): Payer: Self-pay | Admitting: *Deleted

## 2014-12-05 ENCOUNTER — Emergency Department (HOSPITAL_COMMUNITY)
Admission: EM | Admit: 2014-12-05 | Discharge: 2014-12-05 | Disposition: A | Discharge status: Home / Self Care | Payer: 59 | Attending: Emergency Medicine | Admitting: Emergency Medicine

## 2014-12-05 ENCOUNTER — Emergency Department (HOSPITAL_COMMUNITY): Payer: 59

## 2014-12-05 DIAGNOSIS — J029 Acute pharyngitis, unspecified: Secondary | ICD-10-CM | POA: Diagnosis present

## 2014-12-05 DIAGNOSIS — R05 Cough: Secondary | ICD-10-CM

## 2014-12-05 DIAGNOSIS — Z79899 Other long term (current) drug therapy: Secondary | ICD-10-CM | POA: Diagnosis not present

## 2014-12-05 DIAGNOSIS — Z8639 Personal history of other endocrine, nutritional and metabolic disease: Secondary | ICD-10-CM | POA: Diagnosis not present

## 2014-12-05 DIAGNOSIS — Z862 Personal history of diseases of the blood and blood-forming organs and certain disorders involving the immune mechanism: Secondary | ICD-10-CM | POA: Diagnosis not present

## 2014-12-05 DIAGNOSIS — J040 Acute laryngitis: Secondary | ICD-10-CM | POA: Diagnosis not present

## 2014-12-05 DIAGNOSIS — R059 Cough, unspecified: Secondary | ICD-10-CM

## 2014-12-05 LAB — MONONUCLEOSIS SCREEN: Mono Screen: NEGATIVE

## 2014-12-05 LAB — RAPID STREP SCREEN (MED CTR MEBANE ONLY): STREPTOCOCCUS, GROUP A SCREEN (DIRECT): NEGATIVE

## 2014-12-05 MED ORDER — ALBUTEROL SULFATE HFA 108 (90 BASE) MCG/ACT IN AERS
2.0000 | INHALATION_SPRAY | Freq: Once | RESPIRATORY_TRACT | Status: AC
  Administered 2014-12-05: 2 via RESPIRATORY_TRACT
  Filled 2014-12-05: qty 6.7

## 2014-12-05 MED ORDER — FLUTICASONE PROPIONATE 50 MCG/ACT NA SUSP: 2.0000 | Freq: Every day | NASAL | Status: DC

## 2014-12-05 MED ORDER — PREDNISONE 20 MG PO TABS
60.0000 mg | ORAL_TABLET | Freq: Once | ORAL | Status: AC
  Administered 2014-12-05: 60 mg via ORAL
  Filled 2014-12-05: qty 3

## 2014-12-05 MED ORDER — PREDNISONE 20 MG PO TABS: 40.0000 mg | ORAL_TABLET | Freq: Every day | ORAL | Status: DC

## 2014-12-05 NOTE — ED Notes (Signed)
Patient's husband interpreting for patient, declining interpreter line. Pt saw her PCP 2 weeks ago for sore throat, was given Cough medication and azythromycin.  Pt denies throat pain but is concerned that voice is still hoarse.  Pt in NAD.  No white areas or post-nasal drip noted, tonsils appear WDL.

## 2014-12-05 NOTE — Discharge Instructions (Signed)
Your strep screen, mono screen, chest x-ray negative. Take prednisone daily until all gone for inflammation. flonase for congestion. Salt water gargles. Tylenol for pain. Follow up with your doctor for recheck on Monday.    Laryngitis At the top of your windpipe is your voice box. It is the source of your voice. Inside your voice box are 2 bands of muscles called vocal cords. When you breathe, your vocal cords are relaxed and open so that air can get into the lungs. When you decide to say something, these cords come together and vibrate. The sound from these vibrations goes into your throat and comes out through your mouth as sound. Laryngitis is an inflammation of the vocal cords that causes hoarseness, cough, loss of voice, sore throat, and dry throat. Laryngitis can be temporary (acute) or long-term (chronic). Most cases of acute laryngitis improve with time.Chronic laryngitis lasts for more than 3 weeks. CAUSES Laryngitis can often be related to excessive smoking, talking, or yelling, as well as inhalation of toxic fumes and allergies. Acute laryngitis is usually caused by a viral infection, vocal strain, measles or mumps, or bacterial infections. Chronic laryngitis is usually caused by vocal cord strain, vocal cord injury, postnasal drip, growths on the vocal cords, or acid reflux. SYMPTOMS   Cough.  Sore throat.  Dry throat. RISK FACTORS  Respiratory infections.  Exposure to irritating substances, such as cigarette smoke, excessive amounts of alcohol, stomach acids, and workplace chemicals.  Voice trauma, such as vocal cord injury from shouting or speaking too loud. DIAGNOSIS  Your cargiver will perform a physical exam. During the physical exam, your caregiver will examine your throat. The most common sign of laryngitis is hoarseness. Laryngoscopy may be necessary to confirm the diagnosis of this condition. This procedure allows your caregiver to look into the larynx. HOME CARE  INSTRUCTIONS  Drink enough fluids to keep your urine clear or pale yellow.  Rest until you no longer have symptoms or as directed by your caregiver.  Breathe in moist air.  Take all medicine as directed by your caregiver.  Do not smoke.  Talk as little as possible (this includes whispering).  Write on paper instead of talking until your voice is back to normal.  Follow up with your caregiver if your condition has not improved after 10 days. SEEK MEDICAL CARE IF:   You have trouble breathing.  You cough up blood.  You have persistent fever.  You have increasing pain.  You have difficulty swallowing. MAKE SURE YOU:  Understand these instructions.  Will watch your condition.  Will get help right away if you are not doing well or get worse. Document Released: 11/28/2005 Document Revised: 02/20/2012 Document Reviewed: 02/03/2011 [facility] Patient Information 2015 [place], [place]. This information is not intended to replace advice given to you by your health care provider. Make sure you discuss any questions you have with your health care provider.

## 2014-12-05 NOTE — ED Notes (Signed)
Pt comfortable with discharge and follow up instructions. Pt declines wheelchair, escorted to waiting area by this RN. Prescriptions x2. 

## 2014-12-05 NOTE — ED Notes (Signed)
Pt. Has a sore throat that started on 12/15. Went to see a dr. That day and prescribed antibiotics and cough medicine but has not resolved.

## 2014-12-05 NOTE — ED Provider Notes (Signed)
CSN: [id number]     Arrival date & time 12/05/14  1017 History   First MD Initiated Contact with Patient 12/05/14 1118     Chief Complaint  Patient presents with  . Sore Throat     (Consider location/radiation/quality/duration/timing/severity/associated sxs/prior Treatment) HPI Nichole Perry is a 43 y.o. female the medical problems other than anemia and a goiter, presents to emergency department complaining of sore throat. Patient states she has had sore throat and cough for 2 weeks. She was seen by primary care doctor at that time, treated with Z-Pak, though some cough medication, there are [facility], states no relief. She continues to have sore throat and cough. She states her voice is hoarse. She denies any fever or chills. She denies any nasal congestion. She denies any headache, neck pain or stiffness, shortness of breath, nausea, vomiting, diarrhea. No one in the family is sick with the same.   Past Medical History  Diagnosis Date  . Anemia   . Goiter 02/07/2012   Past Surgical History  Procedure Laterality Date  . Back surgery     Family History  Problem Relation Age of Onset  . Hypertension Mother   . Diabetes Father    History  Substance Use Topics  . Smoking status: Never Smoker   . Smokeless tobacco: Never Used  . Alcohol Use: No   OB History    Gravida Para Term Preterm AB TAB SAB Ectopic Multiple Living   0 0 0 0 0 0 0 0 0 0      Review of Systems  Constitutional: Negative for fever and chills.  HENT: Positive for sore throat and voice change. Negative for congestion.   Eyes: Negative for photophobia.  Respiratory: Positive for cough. Negative for chest tightness and shortness of breath.   Cardiovascular: Negative for chest pain, palpitations and leg swelling.  Gastrointestinal: Negative for nausea, vomiting, abdominal pain and diarrhea.  Musculoskeletal: Negative for myalgias, arthralgias, neck pain and neck stiffness.  Skin: Negative for rash.   Neurological: Negative for dizziness, weakness and headaches.  All other systems reviewed and are negative.     Allergies  Review of patient's allergies indicates no known allergies.  Home Medications   Prior to Admission medications   Medication Sig Start Date End Date Taking? Authorizing Provider  VITAMIN D, CHOLECALCIFEROL, PO Take 1 capsule by mouth daily.   Yes Historical Provider, MD   BP 129/82 mmHg  Pulse 78  Temp(Src) 98 F (36.7 C) (Oral)  Resp 18  SpO2 100%  LMP 10/26/2014 Physical Exam  Constitutional: She appears well-developed and well-nourished. No distress.  HENT:  Head: Normocephalic.  Right Ear: Tympanic membrane, external ear and ear canal normal.  Left Ear: Tympanic membrane, external ear and ear canal normal.  Nose: Rhinorrhea present.  Mouth/Throat: Uvula is midline, oropharynx is clear and moist and mucous membranes are normal. No oropharyngeal exudate, posterior oropharyngeal edema, posterior oropharyngeal erythema or tonsillar abscesses.  Eyes: Conjunctivae are normal.  Neck: Neck supple.  Cardiovascular: Normal rate, regular rhythm and normal heart sounds.   Pulmonary/Chest: Effort normal and breath sounds normal. No respiratory distress. She has no wheezes. She has no rales.  Musculoskeletal: She exhibits no edema.  Neurological: She is alert.  Skin: Skin is warm and dry.  Psychiatric: She has a normal mood and affect. Her behavior is normal.  Nursing note and vitals reviewed.   ED Course  Procedures (including critical care time) Labs Review Labs Reviewed  RAPID STREP SCREEN  CULTURE,  GROUP A STREP  MONONUCLEOSIS SCREEN    Imaging Review Dg Chest 2 View  12/05/2014   CLINICAL DATA:  Sore throat, cough, lost voice about 2 weeks ago  EXAM: CHEST  2 VIEW  COMPARISON:  None.  FINDINGS: Cardiomediastinal silhouette is unremarkable. No acute infiltrate or pleural effusion. No pulmonary edema. Bony thorax is unremarkable.  IMPRESSION: No  active cardiopulmonary disease.   Electronically Signed   By: Lahoma Crocker M.D.   On: 12/05/2014 11:54     EKG Interpretation None      MDM   Final diagnoses:  Cough  Laryngitis    Patient with subtle protocol for 2 weeks. She is afebrile, nontoxic appearing. Strep, Monospot, chest x-ray negative. Vital signs within normal. Most likely viral illness with postnasal drip. Will start on Flonase, prednisone for 5 days, inhaler for cough. Follow-up with primary care doctor. Discussed plan with patient and her husband, she agrees.  Filed Vitals:   12/05/14 1024 12/05/14 1257  BP: 129/82 116/78  Pulse: 78 78  Temp: 98 F (36.7 C) 98.1 F (36.7 C)  TempSrc: Oral Oral  Resp: 18 18  SpO2: 100% 100%     Renold Genta, PA-C 12/05/14 1853  Orlie Dakin, MD 12/05/14 1943

## 2014-12-05 NOTE — ED Notes (Signed)
Patient returned from X-ray 

## 2014-12-07 LAB — CULTURE, GROUP A STREP

## 2015-02-21 ENCOUNTER — Ambulatory Visit (INDEPENDENT_AMBULATORY_CARE_PROVIDER_SITE_OTHER): Payer: 59

## 2015-02-21 ENCOUNTER — Other Ambulatory Visit (HOSPITAL_COMMUNITY): Payer: Self-pay

## 2015-02-21 ENCOUNTER — Ambulatory Visit (HOSPITAL_COMMUNITY)
Admission: RE | Admit: 2015-02-21 | Discharge: 2015-02-21 | Disposition: A | Discharge status: Home / Self Care | Payer: 59 | Source: Ambulatory Visit | Attending: Family Medicine | Admitting: Family Medicine

## 2015-02-21 ENCOUNTER — Ambulatory Visit (INDEPENDENT_AMBULATORY_CARE_PROVIDER_SITE_OTHER): Payer: 59 | Admitting: Family Medicine

## 2015-02-21 ENCOUNTER — Other Ambulatory Visit: Payer: Self-pay | Admitting: Family Medicine

## 2015-02-21 VITALS — BP 128/80 | HR 90 | Temp 98.1°F | Resp 18 | Wt 124.0 lb

## 2015-02-21 DIAGNOSIS — J90 Pleural effusion, not elsewhere classified: Secondary | ICD-10-CM

## 2015-02-21 DIAGNOSIS — R0602 Shortness of breath: Secondary | ICD-10-CM

## 2015-02-21 DIAGNOSIS — R05 Cough: Secondary | ICD-10-CM | POA: Insufficient documentation

## 2015-02-21 DIAGNOSIS — R079 Chest pain, unspecified: Secondary | ICD-10-CM | POA: Insufficient documentation

## 2015-02-21 DIAGNOSIS — E041 Nontoxic single thyroid nodule: Secondary | ICD-10-CM

## 2015-02-21 DIAGNOSIS — M791 Myalgia, unspecified site: Secondary | ICD-10-CM

## 2015-02-21 DIAGNOSIS — M25561 Pain in right knee: Secondary | ICD-10-CM | POA: Diagnosis not present

## 2015-02-21 DIAGNOSIS — M7989 Other specified soft tissue disorders: Secondary | ICD-10-CM | POA: Diagnosis not present

## 2015-02-21 DIAGNOSIS — R590 Localized enlarged lymph nodes: Secondary | ICD-10-CM

## 2015-02-21 DIAGNOSIS — M25562 Pain in left knee: Secondary | ICD-10-CM

## 2015-02-21 DIAGNOSIS — R609 Edema, unspecified: Secondary | ICD-10-CM | POA: Diagnosis not present

## 2015-02-21 LAB — POCT URINALYSIS DIPSTICK
Bilirubin, UA: NEGATIVE
Glucose, UA: NEGATIVE
Ketones, UA: 40
Nitrite, UA: NEGATIVE
Protein, UA: NEGATIVE
Spec Grav, UA: <=1.005
Urobilinogen, UA: 1
pH, UA: 5.5

## 2015-02-21 LAB — POCT UA - MICROSCOPIC ONLY
Casts, Ur, LPF, POC: NEGATIVE
Crystals, Ur, HPF, POC: NEGATIVE
YEAST UA: NEGATIVE

## 2015-02-21 LAB — POCT CBC
Granulocyte percent: 61.8 %G (ref 37–80)
HCT, POC: 38.1 % (ref 37.7–47.9)
HEMOGLOBIN: 11.8 g/dL — AB (ref 12.2–16.2)
LYMPH, POC: 1.1 (ref 0.6–3.4)
MCH: 27.4 pg (ref 27–31.2)
MCHC: 31.1 g/dL — AB (ref 31.8–35.4)
MCV: 88 fL (ref 80–97)
MID (cbc): 0.8 (ref 0–0.9)
MPV: 6.5 fL (ref 0–99.8)
PLATELET COUNT, POC: 373 10*3/uL (ref 142–424)
POC Granulocyte: 3 (ref 2–6.9)
POC LYMPH PERCENT: 22.1 %L (ref 10–50)
POC MID %: 16.1 % — AB (ref 0–12)
RBC: 4.33 M/uL (ref 4.04–5.48)
RDW, POC: 13.5 %
WBC: 4.8 10*3/uL (ref 4.6–10.2)

## 2015-02-21 LAB — HIV ANTIBODY (ROUTINE TESTING W REFLEX): HIV 1&2 Ab, 4th Generation: NONREACTIVE

## 2015-02-21 LAB — POCT INFLUENZA A/B
Influenza A, POC: NEGATIVE
Influenza B, POC: NEGATIVE

## 2015-02-21 LAB — COMPREHENSIVE METABOLIC PANEL
ALBUMIN: 3.5 g/dL (ref 3.5–5.2)
ALK PHOS: 64 U/L (ref 39–117)
AST: 14 U/L (ref 0–37)
BUN: 10 mg/dL (ref 6–23)
CO2: 23 meq/L (ref 19–32)
Calcium: 8.9 mg/dL (ref 8.4–10.5)
Chloride: 104 mEq/L (ref 96–112)
Creat: 0.61 mg/dL (ref 0.50–1.10)
GLUCOSE: 71 mg/dL (ref 70–99)
Potassium: 4 mEq/L (ref 3.5–5.3)
Sodium: 139 mEq/L (ref 135–145)
Total Bilirubin: 0.5 mg/dL (ref 0.2–1.2)
Total Protein: 6.7 g/dL (ref 6.0–8.3)

## 2015-02-21 LAB — POCT SEDIMENTATION RATE: POCT SED RATE: 65 mm/h — AB (ref 0–22)

## 2015-02-21 LAB — POCT URINE PREGNANCY: Preg Test, Ur: NEGATIVE

## 2015-02-21 LAB — T4, FREE: FREE T4: 1.28 ng/dL (ref 0.80–1.80)

## 2015-02-21 LAB — TSH: TSH: 0.664 u[IU]/mL (ref 0.350–4.500)

## 2015-02-21 MED ORDER — POTASSIUM CHLORIDE CRYS ER 20 MEQ PO TBCR: 20.0000 meq | EXTENDED_RELEASE_TABLET | Freq: Every day | ORAL | Status: DC

## 2015-02-21 MED ORDER — FUROSEMIDE 20 MG PO TABS: 20.0000 mg | ORAL_TABLET | Freq: Every day | ORAL | Status: DC

## 2015-02-21 MED ORDER — IOHEXOL 350 MG/ML SOLN
100.0000 mL | Freq: Once | INTRAVENOUS | Status: AC | PRN
  Administered 2015-02-21: 100 mL via INTRAVENOUS

## 2015-02-21 NOTE — Addendum Note (Signed)
Addended by: Wardell Honour on: 02/21/2015 04:26 PM   Modules accepted: Orders

## 2015-02-21 NOTE — Addendum Note (Signed)
Addended by: Orion Crook on: 02/21/2015 05:32 PM   Modules accepted: Orders

## 2015-02-21 NOTE — Addendum Note (Signed)
Addended by: Wardell Honour on: 02/21/2015 05:16 PM   Modules accepted: Orders

## 2015-02-21 NOTE — Progress Notes (Addendum)
Subjective:    Patient ID: Nichole Perry, female    DOB: 1971/05/03, 44 y.o.   MRN: [id number]  02/21/2015  Foot Swelling and Knee Pain   HPI This 44 y.o. female presents with husband for evaluation of B knee pain and B feet swelling and SOB and cough.  Onset of symptoms three days ago.  No fever/chills/sweats.  +Sharp shooting chest pain intermittently and radiates to back upper; duration seconds only; no exertional component.  +SOB with exertion with walking up the stairs for past three days.  +Coughing for three days.  No nausea or vomiting; +diarrhea today only.  No sore throat.  No rhinorrhea. No ear pain.  No swelling in hands or abdomen.  +B knee pain; pain with walking.  +myalgias in arms and upper back. Last menstrual period 10/20/2014.  History of irregular menses.    History of thyroid nodule in 2010 with uptake scan; +cold nodule with overactive thyroid consistent of Graves disease; this work up occurred in [place].  Patient has declined surgical resection of thyroid nodule for years; afraid of surgical complications.  Last thyroid US 2010; no enlargement of nodule since 2010 yet now having choking sensations when bends over only.    PCP: Dr. Baird Cancer; last office visit less than one year ago.  Works for [employer] second shift.  Review of Systems  Constitutional: Negative for fever, chills, diaphoresis and fatigue.  HENT: Negative for congestion, ear pain, postnasal drip, rhinorrhea and sore throat.   Respiratory: Positive for cough and shortness of breath. Negative for wheezing.   Cardiovascular: Positive for chest pain and leg swelling. Negative for palpitations.  Gastrointestinal: Negative for nausea, vomiting, abdominal pain and diarrhea.  Musculoskeletal: Positive for myalgias and arthralgias.  Skin: Negative for rash.  Neurological: Positive for headaches. Negative for dizziness and light-headedness.    Past Medical History  Diagnosis Date  . Anemia   . Goiter  02/07/2012   Past Surgical History  Procedure Laterality Date  . Back surgery     No Known Allergies Current Outpatient Prescriptions  Medication Sig Dispense Refill  . VITAMIN D, CHOLECALCIFEROL, PO Take 1 capsule by mouth daily.    . furosemide (LASIX) 20 MG tablet Take 1 tablet (20 mg total) by mouth daily. 30 tablet 0  . potassium chloride SA (K-DUR,KLOR-CON) 20 MEQ tablet Take 1 tablet (20 mEq total) by mouth daily. 20 tablet 0   No current facility-administered medications for this visit.       Objective:    BP 128/80 mmHg  Pulse 90  Temp(Src) 98.1 F (36.7 C) (Oral)  Resp 18  Wt 124 lb (56.246 kg)  SpO2 100%  LMP 10/20/2014 Physical Exam  Constitutional: She is oriented to person, place, and time. She appears well-developed and well-nourished. No distress.  HENT:  Head: Normocephalic and atraumatic.  Right Ear: External ear normal.  Left Ear: External ear normal.  Nose: Nose normal.  Mouth/Throat: Oropharynx is clear and moist.  Eyes: Conjunctivae and EOM are normal. Pupils are equal, round, and reactive to light.  Neck: Normal range of motion. Neck supple. Carotid bruit is not present. Thyroid mass present. No thyromegaly present.    Large palpable thyroid nodule on L.  Cardiovascular: Normal rate, regular rhythm, normal heart sounds and intact distal pulses.  Exam reveals no gallop and no friction rub.   No murmur heard. Pulmonary/Chest: Effort normal and breath sounds normal. She has no wheezes. She has no rales.  Abdominal: Soft. Bowel sounds are  normal. She exhibits no distension and no mass. There is no tenderness. There is no rebound and no guarding.  Lymphadenopathy:    She has no cervical adenopathy.  Neurological: She is alert and oriented to person, place, and time. No cranial nerve deficit.  Skin: Skin is warm and dry. No rash noted. She is not diaphoretic. No erythema. No pallor.  Psychiatric: She has a normal mood and affect. Her behavior is normal.    Results for orders placed or performed in visit on 02/21/15  POCT CBC  Result Value Ref Range   WBC 4.8 4.6 - 10.2 K/uL   Lymph, poc 1.1 0.6 - 3.4   POC LYMPH PERCENT 22.1 10 - 50 %L   MID (cbc) 0.8 0 - 0.9   POC MID % 16.1 (A) 0 - 12 %M   POC Granulocyte 3.0 2 - 6.9   Granulocyte percent 61.8 37 - 80 %G   RBC 4.33 4.04 - 5.48 M/uL   Hemoglobin 11.8 (A) 12.2 - 16.2 g/dL   HCT, POC 38.1 37.7 - 47.9 %   MCV 88.0 80 - 97 fL   MCH, POC 27.4 27 - 31.2 pg   MCHC 31.1 (A) 31.8 - 35.4 g/dL   RDW, POC 13.5 %   Platelet Count, POC 373 142 - 424 K/uL   MPV 6.5 0 - 99.8 fL  POCT Influenza A/B  Result Value Ref Range   Influenza A, POC Negative    Influenza B, POC Negative   POCT urine pregnancy  Result Value Ref Range   Preg Test, Ur Negative   POCT urinalysis dipstick  Result Value Ref Range   Color, UA yellow    Clarity, UA cloudy    Glucose, UA neg    Bilirubin, UA neg    Ketones, UA 40    Spec Grav, UA <=1.005    Blood, UA trace    pH, UA 5.5    Protein, UA neg    Urobilinogen, UA 1.0    Nitrite, UA neg    Leukocytes, UA small (1+)   POCT UA - Microscopic Only  Result Value Ref Range   WBC, Ur, HPF, POC 10-15    RBC, urine, microscopic 2-4    Bacteria, U Microscopic 2+    Mucus, UA 1+    Epithelial cells, urine per micros 15-20    Crystals, Ur, HPF, POC neg    Casts, Ur, LPF, POC neg    Yeast, UA neg    [facility] reading (PRIMARY) by  Dr. Tamala Julian.  CXR: +infiltrate bibasilar  EKG: NSR; NO ACUTE CHANGES.    Assessment & Plan:   1. Myalgia   2. Leg swelling   3. Bilateral knee pain   4. Thyroid nodule   5. SOB (shortness of breath)   6. Chest pain, unspecified chest pain type     1.  Leg swelling:  New. Ddx include venous stasis, myxedema, CHF, renal failure.  Obtain labs.  Treat with Lasix 20mg  daily, KCL 8meq daily.  If no improvement in one week, RTC.  If thyroid labs normal, refer to cardiology to rule out cardiac etiology.  Associated with pleural  effusions. 2.  B knee pain: New.  Associated leg swelling.  Full range of motion of knees without reproducible pain; feel that knee pain due to LE edema. 3.  Thyroid nodule: New to this provider; chronic for patient; refer to thyroid US; pt reluctant to undergo resection despite intermittent choking sensation.  Obtain labs.  4.  SOB:  New.  Exertional; onset in past 3 days with lower extremity edema; obtain labs; CXR with pleural effusions; EKG normal; no respiratory distess; RTC for acute worsening.  Warrants CT chest to evaluate for PE, mass, infectious process, etc.  Obtain Quantiferon Gold, HIV.   5.  Chest pain: New. Atypical in nature yet associated with DOE, leg swelling.  If worsens/recurs, to ED.   Meds ordered this encounter  Medications  . furosemide (LASIX) 20 MG tablet    Sig: Take 1 tablet (20 mg total) by mouth daily.    Dispense:  30 tablet    Refill:  0  . potassium chloride SA (K-DUR,KLOR-CON) 20 MEQ tablet    Sig: Take 1 tablet (20 mEq total) by mouth daily.    Dispense:  20 tablet    Refill:  0    No Follow-up on file.     Kristi Elayne Guerin, M.D. [facility] [address] [place], [place]  [address] [phone] phone [phone] fax

## 2015-02-23 LAB — THYROGLOBULIN LEVEL: Thyroglobulin: 78.9 ng/mL — ABNORMAL HIGH (ref 2.8–40.9)

## 2015-02-24 NOTE — Addendum Note (Signed)
Addended by: Wardell Honour on: 02/24/2015 03:04 PM   Modules accepted: Orders

## 2015-02-25 ENCOUNTER — Ambulatory Visit
Admission: RE | Admit: 2015-02-25 | Discharge: 2015-02-25 | Disposition: A | Discharge status: Home / Self Care | Payer: 59 | Source: Ambulatory Visit | Attending: Family Medicine | Admitting: Family Medicine

## 2015-02-25 DIAGNOSIS — E041 Nontoxic single thyroid nodule: Secondary | ICD-10-CM

## 2015-02-26 ENCOUNTER — Other Ambulatory Visit: Payer: Self-pay | Admitting: Family Medicine

## 2015-02-26 DIAGNOSIS — J90 Pleural effusion, not elsewhere classified: Secondary | ICD-10-CM

## 2015-02-27 ENCOUNTER — Telehealth: Payer: Self-pay

## 2015-02-27 NOTE — Telephone Encounter (Signed)
Spoke with patient's husband at her request. Explained that nodules were found on ultrasound and that biopsy under ultrasound guidance is necessary.  Patient's husband voiced understanding and she will present for appointment. Philis Fendt, MS, PA-C   4:16 PM, 02/27/2015

## 2015-02-27 NOTE — Telephone Encounter (Signed)
Patient is concerned about why they are going for another ultrasound. They state they were never contacted about the results of the ultrasound from 02/25/15. Can someone please give the patients husband a call with the results.

## 2015-03-02 ENCOUNTER — Other Ambulatory Visit: Payer: Self-pay | Admitting: Family Medicine

## 2015-03-02 DIAGNOSIS — R590 Localized enlarged lymph nodes: Secondary | ICD-10-CM

## 2015-03-02 DIAGNOSIS — J90 Pleural effusion, not elsewhere classified: Secondary | ICD-10-CM

## 2015-03-03 ENCOUNTER — Ambulatory Visit (HOSPITAL_COMMUNITY)
Admission: RE | Admit: 2015-03-03 | Discharge: 2015-03-03 | Disposition: A | Discharge status: Home / Self Care | Payer: 59 | Source: Ambulatory Visit | Attending: Diagnostic Radiology | Admitting: Diagnostic Radiology

## 2015-03-03 ENCOUNTER — Ambulatory Visit (HOSPITAL_COMMUNITY)
Admission: RE | Admit: 2015-03-03 | Discharge: 2015-03-03 | Disposition: A | Discharge status: Home / Self Care | Payer: 59 | Source: Ambulatory Visit | Attending: Radiology | Admitting: Radiology

## 2015-03-03 ENCOUNTER — Emergency Department (HOSPITAL_COMMUNITY)
Admission: EM | Admit: 2015-03-03 | Discharge: 2015-03-03 | Disposition: A | Discharge status: Other Acute Inpatient Hospital | Payer: 59

## 2015-03-03 DIAGNOSIS — Z9889 Other specified postprocedural states: Secondary | ICD-10-CM

## 2015-03-03 DIAGNOSIS — J90 Pleural effusion, not elsewhere classified: Secondary | ICD-10-CM | POA: Insufficient documentation

## 2015-03-03 LAB — BODY FLUID CELL COUNT WITH DIFFERENTIAL
EOS FL: 8 %
Lymphs, Fluid: 40 %
Monocyte-Macrophage-Serous Fluid: 24 % — ABNORMAL LOW (ref 50–90)
Neutrophil Count, Fluid: 28 % — ABNORMAL HIGH (ref 0–25)
Total Nucleated Cell Count, Fluid: 3000 cu mm — ABNORMAL HIGH (ref 0–1000)

## 2015-03-03 LAB — LACTATE DEHYDROGENASE, PLEURAL OR PERITONEAL FLUID: LD FL: 253 U/L — AB (ref 3–23)

## 2015-03-03 LAB — GLUCOSE, SEROUS FLUID: GLUCOSE FL: 104 mg/dL

## 2015-03-03 LAB — PROTEIN, BODY FLUID: TOTAL PROTEIN, FLUID: 4.4 g/dL

## 2015-03-03 NOTE — ED Notes (Signed)
Called urgent care. Pt has appointment at radiology at 3pm, accidentally checked in wrong department

## 2015-03-03 NOTE — Procedures (Signed)
US guided diagnostic/therapeutic right thoracentesis performed yielding 360 cc's slightly turbid, yellow fluid. The fluid was sent to the lab for preordered studies. F/u CXR pending. No immediate complications.

## 2015-03-04 LAB — PH, BODY FLUID: PH, FLUID: 8

## 2015-03-05 ENCOUNTER — Ambulatory Visit (INDEPENDENT_AMBULATORY_CARE_PROVIDER_SITE_OTHER): Payer: 59

## 2015-03-05 ENCOUNTER — Ambulatory Visit (INDEPENDENT_AMBULATORY_CARE_PROVIDER_SITE_OTHER): Payer: 59 | Admitting: Family Medicine

## 2015-03-05 VITALS — BP 90/60 | Temp 99.1°F | Resp 16 | Ht 62.0 in | Wt 118.0 lb

## 2015-03-05 DIAGNOSIS — E041 Nontoxic single thyroid nodule: Secondary | ICD-10-CM

## 2015-03-05 DIAGNOSIS — N912 Amenorrhea, unspecified: Secondary | ICD-10-CM

## 2015-03-05 DIAGNOSIS — R590 Localized enlarged lymph nodes: Secondary | ICD-10-CM

## 2015-03-05 DIAGNOSIS — R509 Fever, unspecified: Secondary | ICD-10-CM

## 2015-03-05 DIAGNOSIS — J90 Pleural effusion, not elsewhere classified: Secondary | ICD-10-CM | POA: Diagnosis not present

## 2015-03-05 DIAGNOSIS — R600 Localized edema: Secondary | ICD-10-CM

## 2015-03-05 DIAGNOSIS — M2550 Pain in unspecified joint: Secondary | ICD-10-CM

## 2015-03-05 LAB — AMYLASE, PLEURAL FLUID: Amylase, Pleural Fluid: 26 U/L

## 2015-03-05 LAB — POCT CBC
Granulocyte percent: 57.9 %G (ref 37–80)
HEMATOCRIT: 36.6 % — AB (ref 37.7–47.9)
Hemoglobin: 11.3 g/dL — AB (ref 12.2–16.2)
Lymph, poc: 1.5 (ref 0.6–3.4)
MCH, POC: 26.9 pg — AB (ref 27–31.2)
MCHC: 30.8 g/dL — AB (ref 31.8–35.4)
MCV: 87.2 fL (ref 80–97)
MID (cbc): 0.4 (ref 0–0.9)
MPV: 6.8 fL (ref 0–99.8)
POC GRANULOCYTE: 2.5 (ref 2–6.9)
POC LYMPH %: 34.1 % (ref 10–50)
POC MID %: 8 %M (ref 0–12)
Platelet Count, POC: 695 10*3/uL — AB (ref 142–424)
RBC: 4.2 M/uL (ref 4.04–5.48)
RDW, POC: 13.8 %
WBC: 4.4 10*3/uL — AB (ref 4.6–10.2)

## 2015-03-05 LAB — POCT URINE PREGNANCY: PREG TEST UR: NEGATIVE

## 2015-03-05 NOTE — Progress Notes (Addendum)
Subjective:   This chart was scribed for Nichole Forts, MD by Erling Conte, ED Scribe. This patient was seen in Room 10 and the patient's care was started at 8:46 PM.   Patient ID: Nichole Perry, female    DOB: [birth year], 44 y.o.   MRN: [id number]  03/05/2015  Follow-up and Knee Pain  HPI  HPI Comments: Nichole Perry is a 44 y.o. female who presents to the [facility] for twelve day f/u of bilateral pleural effusions, B leg swelling. Dr. Tamala Julian saw pt 10 days ago with SOB/DOE, B knee pain, and B leg swelling. She had a CXR that showed fluid/effusions on her lungs; EKG was WNL. Pt send for CT scan of lungs which showed large pleural effusions, no evidence of pulmonary embolism, 4.5cm L thyroid nodule, and R axillary LAD.  She is s/p thoracentesis 2 days ago; tolerated procedure well.  She has enlarged lymph nodes in right axilla and is waiting to be scheduled for diagnostic mammogram.  Since last visit, pt is having swelling and pain of bilateral hands. +stiffness every morning in hands.   She denies problems with extremity swelling since starting Lasix for leg swelling.  The previous swelling in her lower legs has improved.   She reports she is still having ongoing knee pain which hurts to walk.  Pain is located in posterior knees.  Denies shoulder pain, hip pain, back pain.     Pt husband states that the pt has been spiking a fever at night; they have not been checking temperature at home. She also has a mild cough when leaning over and began when she started having the leg swelling.  Pt feels that thyroid nodule also contributes to cough.  No sputum production or hemoptysis.  No contacts with tuberculosis that she is aware of; pt is from [place].  ESR 65 at last visit.  HIV negative.  Tb Gold unable to be completed.  No family history of lupus or rheumatoid arthritis.   Pt is also complaining that the nipple of her right breast is hard compared to her left. Pt has taken  medicine for her goiter in the past in [place] with no improvement.  S/p thyroid biopsy ten years ago in [place]; pathology benign. Pt is now agreeable to thyroid nodule resection due to large size.   TSH and free T4 WNL at last visit; thyroglobulin level elevated.  She denies any HA, otalgia, dizziness, sore throat, abdominal pain,or SOB or orthopnea.  Appetite is decreased.  Denies n/v/d/c.    Review of Systems  Constitutional: Positive for fever, chills and fatigue. Negative for diaphoresis.  HENT: Negative for congestion, ear pain, postnasal drip, rhinorrhea, sinus pressure, sneezing and sore throat.   Respiratory: Positive for cough. Negative for shortness of breath, wheezing and stridor.   Cardiovascular: Negative for chest pain, palpitations and leg swelling.  Gastrointestinal: Negative for nausea, abdominal pain, diarrhea and abdominal distention.  Genitourinary: Negative for dysuria, urgency, frequency and flank pain.  Musculoskeletal: Positive for joint swelling, arthralgias and gait problem. Negative for back pain, neck pain and neck stiffness.  Skin: Negative for rash.  Neurological: Negative for dizziness, facial asymmetry and headaches.    Past Medical History  Diagnosis Date  . Anemia   . Goiter 02/07/2012   Past Surgical History  Procedure Laterality Date  . Back surgery     No Known Allergies Current Outpatient Prescriptions  Medication Sig Dispense Refill  . furosemide (LASIX) 20 MG tablet Take  1 tablet (20 mg total) by mouth daily. 30 tablet 0  . potassium chloride SA (K-DUR,KLOR-CON) 20 MEQ tablet Take 1 tablet (20 mEq total) by mouth daily. (Patient not taking: Reported on 03/05/2015) 20 tablet 0  . VITAMIN D, CHOLECALCIFEROL, PO Take 1 capsule by mouth daily.     No current facility-administered medications for this visit.       Objective:    Triage Vitals: BP 90/60 mmHg  Temp(Src) 99.1 F (37.3 C) (Oral)  Resp 16  Ht 5' 2" (1.575 m)  Wt 118 lb (53.524 kg)   BMI 21.58 kg/m2  LMP 10/20/2014 (LMP Unknown)  Physical Exam  Constitutional: She is oriented to person, place, and time. She appears well-developed and well-nourished.  Non-toxic appearance. She appears ill. No distress.  HENT:  Head: Normocephalic and atraumatic.  Right Ear: External ear normal.  Left Ear: External ear normal.  Nose: Nose normal.  Mouth/Throat: Oropharynx is clear and moist.  Eyes: Conjunctivae and EOM are normal. Pupils are equal, round, and reactive to light.  Neck: Neck supple. No tracheal deviation present. Thyromegaly present.  Large nodule of left thyroid  Cardiovascular: Normal rate, regular rhythm and normal heart sounds.  Exam reveals no gallop and no friction rub.   No murmur heard. No LE edema.  Pulmonary/Chest: Effort normal. No respiratory distress. She has no wheezes. She has rales in the right lower field and the left lower field. Right breast exhibits no inverted nipple, no mass, no nipple discharge, no skin change and no tenderness. Left breast exhibits no inverted nipple, no mass, no nipple discharge, no skin change and no tenderness. Breasts are symmetrical.  Abdominal: Soft. Bowel sounds are normal. She exhibits no distension and no mass. There is no tenderness. There is no rebound and no guarding.  Genitourinary:  Normal breast exam  Musculoskeletal: Normal range of motion. She exhibits no edema.       Right knee: She exhibits swelling and effusion. She exhibits normal range of motion. No tenderness found. No medial joint line, no lateral joint line and no patellar tendon tenderness noted.       Left knee: She exhibits swelling and effusion. She exhibits normal range of motion. No tenderness found. No medial joint line and no lateral joint line tenderness noted.       Right hand: She exhibits bony tenderness and swelling.       Left hand: She exhibits bony tenderness and swelling.  Diffuse swelling of bilateral hands with decreased ROM. Swelling of  posterior knees but no edema in legs.   Lymphadenopathy:    She has no cervical adenopathy.    She has no axillary adenopathy.       Right axillary: No pectoral and no lateral adenopathy present.       Left axillary: No pectoral and no lateral adenopathy present. Neurological: She is alert and oriented to person, place, and time. No cranial nerve deficit. She exhibits normal muscle tone. Coordination normal.  Skin: Skin is warm and dry. Rash noted. She is not diaphoretic.  Psychiatric: She has a normal mood and affect. Her behavior is normal.  Nursing note and vitals reviewed.  Results for orders placed or performed in visit on 03/05/15  Comprehensive metabolic panel  Result Value Ref Range   Sodium 138 135 - 145 mEq/L   Potassium 4.4 3.5 - 5.3 mEq/L   Chloride 100 96 - 112 mEq/L   CO2 22 19 - 32 mEq/L   Glucose, Bld 99  70 - 99 mg/dL   BUN 8 6 - 23 mg/dL   Creat 0.62 0.50 - 1.10 mg/dL   Total Bilirubin 0.5 0.2 - 1.2 mg/dL   Alkaline Phosphatase 63 39 - 117 U/L   AST 60 (H) 0 - 37 U/L   ALT 43 (H) 0 - 35 U/L   Total Protein 6.8 6.0 - 8.3 g/dL   Albumin 3.2 (L) 3.5 - 5.2 g/dL   Calcium 9.0 8.4 - 10.5 mg/dL  Lactate Dehydrogenase  Result Value Ref Range   LDH 187 94 - 250 U/L  ANA  Result Value Ref Range   Anit Nuclear Antibody(ANA)  NEGATIVE  Rheumatoid factor  Result Value Ref Range   Rhuematoid fact SerPl-aCnc 11 <=14 IU/mL  Sedimentation rate  Result Value Ref Range   Sed Rate 105 (H) 0 - 20 mm/hr  POCT CBC  Result Value Ref Range   WBC 4.4 (A) 4.6 - 10.2 K/uL   Lymph, poc 1.5 0.6 - 3.4   POC LYMPH PERCENT 34.1 10 - 50 %L   MID (cbc) 0.4 0 - 0.9   POC MID % 8.0 0 - 12 %M   POC Granulocyte 2.5 2 - 6.9   Granulocyte percent 57.9 37 - 80 %G   RBC 4.20 4.04 - 5.48 M/uL   Hemoglobin 11.3 (A) 12.2 - 16.2 g/dL   HCT, POC 36.6 (A) 37.7 - 47.9 %   MCV 87.2 80 - 97 fL   MCH, POC 26.9 (A) 27 - 31.2 pg   MCHC 30.8 (A) 31.8 - 35.4 g/dL   RDW, POC 13.8 %   Platelet Count,  POC 695 (A) 142 - 424 K/uL   MPV 6.8 0 - 99.8 fL  POCT urine pregnancy  Result Value Ref Range   Preg Test, Ur Negative    [facility] reading (PRIMARY) by  Dr. Tamala Julian.  R HAND:  NAD;  R KNEE:  NAD      Assessment & Plan:   1. Bilateral pleural effusion   2. Arthralgia   3. Amenorrhea   4. Thyroid nodule   5. LAD (lymphadenopathy), axillary   6. Bilateral edema of lower extremity     1. B pleural effusions -Improved.  S/p thoracentesis with analysis.  Exudative in nature. -Cultures pending but negative to date.  Awaiting final results. -Obtain ANA, RF, and Tb Gold testing.   -With possible fever, mild cough, and [place] origin, very concerning for Tb.  Will discuss with ID.  2.  Arthralgias -Worsening -Initially B knee pain and now having B hand involvement with morning stiffness; obvious B hand swelling and decreased ROM in office. -Obtain ANA, RF; repeat ESR.  3.  Thyroid nodule -s/p thyroid ultrasound and CT angio that revealed 4.5cm L thyroid nodule. -Refer for biopsy and general surgery consultation for resection.  4.  Axillary LAD R -New. -Pt has been scheduled for diagnostic mammogram and [place]. -If mammogram and ultrasound negative, will warrant bx of axillary lymph node.  5.  B lower extremity edema -Improved with Lasix.  Discontinue Lasix at this time.   No orders of the defined types were placed in this encounter.    No Follow-up on file.  I personally performed the services described in this documentation, which was scribed in my presence. The recorded information has been reviewed and considered.  Kristi Elayne Guerin, M.D. [facility] [address] [place], [place]  [address] [phone] phone [phone] fax

## 2015-03-05 NOTE — Telephone Encounter (Signed)
No msg °

## 2015-03-06 LAB — COMPREHENSIVE METABOLIC PANEL
ALK PHOS: 63 U/L (ref 39–117)
ALT: 43 U/L — ABNORMAL HIGH (ref 0–35)
AST: 60 U/L — AB (ref 0–37)
Albumin: 3.2 g/dL — ABNORMAL LOW (ref 3.5–5.2)
BUN: 8 mg/dL (ref 6–23)
CO2: 22 mEq/L (ref 19–32)
CREATININE: 0.62 mg/dL (ref 0.50–1.10)
Calcium: 9 mg/dL (ref 8.4–10.5)
Chloride: 100 mEq/L (ref 96–112)
Glucose, Bld: 99 mg/dL (ref 70–99)
POTASSIUM: 4.4 meq/L (ref 3.5–5.3)
Sodium: 138 mEq/L (ref 135–145)
Total Bilirubin: 0.5 mg/dL (ref 0.2–1.2)
Total Protein: 6.8 g/dL (ref 6.0–8.3)

## 2015-03-06 LAB — RHEUMATOID FACTOR: Rhuematoid fact SerPl-aCnc: 11 IU/mL (ref <=14)

## 2015-03-06 LAB — LACTATE DEHYDROGENASE: LDH: 187 U/L (ref 94–250)

## 2015-03-06 LAB — SEDIMENTATION RATE: SED RATE: 105 mm/h — AB (ref 0–20)

## 2015-03-07 DIAGNOSIS — E041 Nontoxic single thyroid nodule: Secondary | ICD-10-CM | POA: Insufficient documentation

## 2015-03-07 DIAGNOSIS — R600 Localized edema: Secondary | ICD-10-CM | POA: Insufficient documentation

## 2015-03-07 DIAGNOSIS — M2550 Pain in unspecified joint: Secondary | ICD-10-CM | POA: Insufficient documentation

## 2015-03-07 DIAGNOSIS — R590 Localized enlarged lymph nodes: Secondary | ICD-10-CM | POA: Insufficient documentation

## 2015-03-07 DIAGNOSIS — N912 Amenorrhea, unspecified: Secondary | ICD-10-CM | POA: Insufficient documentation

## 2015-03-07 DIAGNOSIS — J90 Pleural effusion, not elsewhere classified: Secondary | ICD-10-CM | POA: Insufficient documentation

## 2015-03-07 LAB — QUANTIFERON TB GOLD ASSAY (BLOOD)
Interferon Gamma Release Assay: POSITIVE — AB
MITOGEN VALUE: 0.31 [IU]/mL
Quantiferon Nil Value: 0.05 IU/mL
Quantiferon Tb Ag Minus Nil Value: 0.5 IU/mL
TB Ag value: 0.55 IU/mL

## 2015-03-07 LAB — BODY FLUID CULTURE: CULTURE: NO GROWTH

## 2015-03-09 LAB — ANA: ANA: NEGATIVE

## 2015-03-10 ENCOUNTER — Ambulatory Visit
Admission: RE | Admit: 2015-03-10 | Discharge: 2015-03-10 | Disposition: A | Discharge status: Home / Self Care | Payer: 59 | Source: Ambulatory Visit | Attending: Family Medicine | Admitting: Family Medicine

## 2015-03-10 ENCOUNTER — Other Ambulatory Visit (HOSPITAL_COMMUNITY)
Admission: RE | Admit: 2015-03-10 | Discharge: 2015-03-10 | Disposition: A | Discharge status: Home / Self Care | Payer: 59 | Source: Ambulatory Visit | Attending: Interventional Radiology | Admitting: Interventional Radiology

## 2015-03-10 DIAGNOSIS — E041 Nontoxic single thyroid nodule: Secondary | ICD-10-CM | POA: Diagnosis not present

## 2015-03-10 LAB — OTHER BODY FLUID CHEMISTRY
MISCELLANEOUS TEST RESULTS: 25
MISCELLANEOUS TEST RESULTS: 86

## 2015-03-10 NOTE — Addendum Note (Signed)
Addended by: Wardell Honour on: 03/10/2015 06:21 PM   Modules accepted: Orders

## 2015-03-12 ENCOUNTER — Ambulatory Visit (INDEPENDENT_AMBULATORY_CARE_PROVIDER_SITE_OTHER): Payer: 59 | Admitting: Family Medicine

## 2015-03-12 ENCOUNTER — Encounter: Payer: Self-pay | Admitting: Family Medicine

## 2015-03-12 ENCOUNTER — Ambulatory Visit (INDEPENDENT_AMBULATORY_CARE_PROVIDER_SITE_OTHER): Payer: 59

## 2015-03-12 ENCOUNTER — Ambulatory Visit: Payer: 59

## 2015-03-12 ENCOUNTER — Telehealth: Payer: Self-pay | Admitting: Family Medicine

## 2015-03-12 VITALS — BP 103/72 | HR 104 | Temp 98.1°F | Resp 16 | Ht 62.0 in | Wt 115.8 lb

## 2015-03-12 DIAGNOSIS — J90 Pleural effusion, not elsewhere classified: Secondary | ICD-10-CM

## 2015-03-12 DIAGNOSIS — M2540 Effusion, unspecified joint: Secondary | ICD-10-CM

## 2015-03-12 DIAGNOSIS — E041 Nontoxic single thyroid nodule: Secondary | ICD-10-CM

## 2015-03-12 DIAGNOSIS — R590 Localized enlarged lymph nodes: Secondary | ICD-10-CM | POA: Diagnosis not present

## 2015-03-12 DIAGNOSIS — R509 Fever, unspecified: Secondary | ICD-10-CM | POA: Diagnosis not present

## 2015-03-12 DIAGNOSIS — M2550 Pain in unspecified joint: Secondary | ICD-10-CM

## 2015-03-12 LAB — POCT URINALYSIS DIPSTICK
GLUCOSE UA: NEGATIVE
Leukocytes, UA: NEGATIVE
Nitrite, UA: NEGATIVE
PROTEIN UA: 30
Spec Grav, UA: >=1.03
UROBILINOGEN UA: 2
pH, UA: 5

## 2015-03-12 LAB — COMPREHENSIVE METABOLIC PANEL
ALK PHOS: 93 U/L (ref 39–117)
ALT: 65 U/L — AB (ref 0–35)
AST: 69 U/L — AB (ref 0–37)
Albumin: 3 g/dL — ABNORMAL LOW (ref 3.5–5.2)
BUN: 7 mg/dL (ref 6–23)
CHLORIDE: 101 meq/L (ref 96–112)
CO2: 21 mEq/L (ref 19–32)
CREATININE: 0.53 mg/dL (ref 0.50–1.10)
Calcium: 8.9 mg/dL (ref 8.4–10.5)
Glucose, Bld: 101 mg/dL — ABNORMAL HIGH (ref 70–99)
Potassium: 4.2 mEq/L (ref 3.5–5.3)
Sodium: 136 mEq/L (ref 135–145)
Total Bilirubin: 0.4 mg/dL (ref 0.2–1.2)
Total Protein: 6.8 g/dL (ref 6.0–8.3)

## 2015-03-12 MED ORDER — MELOXICAM 15 MG PO TABS: 15.0000 mg | ORAL_TABLET | Freq: Every day | ORAL | Status: DC

## 2015-03-12 NOTE — Addendum Note (Signed)
Addended by: Wardell Honour on: 03/12/2015 09:15 AM   Modules accepted: Orders

## 2015-03-12 NOTE — Telephone Encounter (Signed)
Spoke with Dr. Megan Salon of ID regarding patient reporting documented fever to 101.5 last night; Dr. Megan Salon recommends ID consultation.  M. Tuberculosis PCR also added to pleural fluid on 03/10/15.  Patient advised to return for reevaluation at [facility] today at 12:00 noon.  Referral to ID placed.

## 2015-03-12 NOTE — Patient Instructions (Signed)
1. Restart Furosemide/Lasix 20mg  one tablet every morning. 2. Start Meloxicam 15mg  one tablet daily for joint swelling and pain. 3. I am referring you to an Infectious Disease specialist (specializes in infections). 4. I am also referring you to a rheumatologist (specializes in joint pain and swelling). 5. Please call me immediately if you start feeling worse. 6. Please take Tylenol/Acetaminophen 2 tablets four times daily for fever and pain.  You can purchase this at [facility].

## 2015-03-12 NOTE — Progress Notes (Signed)
Subjective:    Patient ID: Nichole Perry, female    DOB: [birth year], 44 y.o.   MRN: [id number]  03/12/2015  Follow-up; Pleural Effusion; and Amenorrhea   HPI This 44 y.o. female presents with husband for one week follow-up of cough, DOE, arthralgias, night sweats.  Has documented temperature for past two nights.  Fever running 101.9 and 101.5 for past two nights.  +sweats and chills.  Originally from [place].   +mild headache; no ear pain or sore throat. No rhinorrhea or nasal congestion. No rash.  No n/v/d/c.  No dysuria, urgency, hematuria, frequency.  +cough is decreasing. No DOE or orthopnea.  No chest pain.  Since stopping Lasix last week, leg swelling has recurred.  +B hands continue to swell; pt continues to suffer with stiffness in B hands every morning.  +decrease ROM of B hands due to pain and swelling.  +B knee pain persists; +swelling R knee.  +B feet swelling has recurred.  Denies shoulder pain, elbow pain, hip pain. Denies neck pain or back pain.  S/p thyroid nodule biopsy this week; biopsy results benign.  Has been contacted regarding appointment with general surgery to discuss resection.  Has not been notified regarding diagnostic mammogram to address R axillary LAD.    Not taking any medication for arthralgias or fever.  +malaise and fatigue.  Eating a bit better from last week.  S/p thoracentesis; fluid culture negative to date; AFB culture negative to date.  +exudataive.  HIV negative.  ANA negative.  RF negative.  TSH WNL.  ESR last week 105.  Appointment with ID on 03/23/2015.    Review of Systems  Constitutional: Positive for fever, chills, diaphoresis, fatigue and unexpected weight change.  HENT: Negative for congestion, ear discharge, ear pain, mouth sores, rhinorrhea, sinus pressure, sneezing, sore throat and trouble swallowing.   Eyes: Negative for photophobia, redness and visual disturbance.  Respiratory: Positive for cough. Negative for shortness of breath and  wheezing.   Cardiovascular: Positive for leg swelling. Negative for chest pain and palpitations.  Gastrointestinal: Negative for nausea, vomiting, abdominal pain, diarrhea, constipation, blood in stool, abdominal distention, anal bleeding and rectal pain.  Genitourinary: Negative for dysuria, urgency, frequency, hematuria, flank pain, vaginal discharge, genital sores, vaginal pain and pelvic pain.  Musculoskeletal: Positive for joint swelling, arthralgias and gait problem. Negative for myalgias, back pain, neck pain and neck stiffness.  Skin: Negative for rash.  Neurological: Positive for headaches. Negative for dizziness.    Past Medical History  Diagnosis Date  . Anemia   . Goiter 02/07/2012   Past Surgical History  Procedure Laterality Date  . Back surgery     No Known Allergies      Objective:    BP 103/72 mmHg  Pulse 104  Temp(Src) 98.1 F (36.7 C) (Oral)  Resp 16  Ht 5' 2"  (1.575 m)  Wt 115 lb 12.8 oz (52.527 kg)  BMI 21.17 kg/m2  SpO2 100%  LMP 10/20/2014 (LMP Unknown) Physical Exam  Constitutional: She is oriented to person, place, and time. She appears well-developed and well-nourished. She appears ill.  HENT:  Head: Normocephalic and atraumatic.  Right Ear: External ear normal.  Left Ear: External ear normal.  Nose: Nose normal.  Mouth/Throat: Oropharynx is clear and moist.  Eyes: Conjunctivae and EOM are normal. Pupils are equal, round, and reactive to light.  Neck: Normal range of motion. Neck supple. Thyromegaly present.  Cardiovascular: Normal rate, regular rhythm, normal heart sounds and intact distal pulses.  Exam reveals  no gallop and no friction rub.   No murmur heard. Pulmonary/Chest: Effort normal. No respiratory distress. She has decreased breath sounds in the right lower field. She has no wheezes. She has no rhonchi. She has rales in the right lower field.  Abdominal: Soft. Bowel sounds are normal. She exhibits no distension and no mass. There is no  tenderness. There is no rebound and no guarding.  Musculoskeletal: She exhibits edema.       Right knee: She exhibits decreased range of motion, swelling and effusion. She exhibits no erythema. No tenderness found. No medial joint line, no lateral joint line and no MCL tenderness noted.       Left knee: She exhibits normal range of motion, no swelling and no effusion. No tenderness found. No medial joint line and no lateral joint line tenderness noted.       Right hand: She exhibits decreased range of motion, tenderness and swelling. Decreased strength noted.       Left hand: She exhibits decreased range of motion and swelling. Decreased strength noted.       Right foot: There is swelling. There is normal range of motion and no tenderness.       Left foot: There is swelling. There is normal range of motion and no tenderness.  Lymphadenopathy:    She has no cervical adenopathy.  Neurological: She is alert and oriented to person, place, and time. No cranial nerve deficit. She exhibits normal muscle tone. Coordination normal.  Skin: No rash noted.  Psychiatric: She has a normal mood and affect. Her behavior is normal.    [facility] reading (PRIMARY) by  Dr. Tamala Julian.  CXR:  Decrease in pleural effusions; residual effusion LLL.  +Nipple L lung lower.      Assessment & Plan:   1. Other specified fever   2. Arthralgia   3. Joint swelling   4. Pleural effusion   5. Left thyroid nodule   6. LAD (lymphadenopathy), axillary     -Persistent fever, arthralgias; improving cough; no/resolved DOE.  -Obtain blood cultures; repeat CXR completed.   -Appointment with ID on 4/11/12016; follow-up on appointment with rheumatology.  -Rx for Mobic 101m daily for arthralgias.  Recommend Tylenol every six hours for fever. Avoid Prednisone due to concern of infectious process.   -To ED for acute decline. 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  -OK to restart Lasix for feet  swelling.  -Awaiting diagnostic mammogram. -s/p thyroid nodule biopsy with negative pathology.    Meds ordered this encounter  Medications  . meloxicam (MOBIC) 15 MG tablet    Sig: Take 1 tablet (15 mg total) by mouth daily.    Dispense:  30 tablet    Refill:  0    No Follow-up on file.     Srihaan Mastrangelo MElayne Guerin M.D. [id number]  [phone]

## 2015-03-13 ENCOUNTER — Telehealth: Payer: Self-pay

## 2015-03-13 DIAGNOSIS — E041 Nontoxic single thyroid nodule: Secondary | ICD-10-CM | POA: Diagnosis present

## 2015-03-13 DIAGNOSIS — M1990 Unspecified osteoarthritis, unspecified site: Secondary | ICD-10-CM | POA: Diagnosis present

## 2015-03-13 DIAGNOSIS — Z789 Other specified health status: Secondary | ICD-10-CM

## 2015-03-13 DIAGNOSIS — E46 Unspecified protein-calorie malnutrition: Secondary | ICD-10-CM | POA: Diagnosis present

## 2015-03-13 DIAGNOSIS — M25469 Effusion, unspecified knee: Secondary | ICD-10-CM | POA: Diagnosis present

## 2015-03-13 DIAGNOSIS — N912 Amenorrhea, unspecified: Secondary | ICD-10-CM | POA: Diagnosis present

## 2015-03-13 DIAGNOSIS — M7989 Other specified soft tissue disorders: Secondary | ICD-10-CM | POA: Diagnosis present

## 2015-03-13 DIAGNOSIS — Z6822 Body mass index (BMI) 22.0-22.9, adult: Secondary | ICD-10-CM

## 2015-03-13 DIAGNOSIS — B957 Other staphylococcus as the cause of diseases classified elsewhere: Secondary | ICD-10-CM | POA: Diagnosis present

## 2015-03-13 DIAGNOSIS — M25562 Pain in left knee: Secondary | ICD-10-CM | POA: Diagnosis present

## 2015-03-13 DIAGNOSIS — R591 Generalized enlarged lymph nodes: Secondary | ICD-10-CM | POA: Diagnosis present

## 2015-03-13 DIAGNOSIS — M25561 Pain in right knee: Secondary | ICD-10-CM | POA: Diagnosis present

## 2015-03-13 DIAGNOSIS — R7611 Nonspecific reaction to tuberculin skin test without active tuberculosis: Secondary | ICD-10-CM | POA: Diagnosis present

## 2015-03-13 DIAGNOSIS — R61 Generalized hyperhidrosis: Secondary | ICD-10-CM | POA: Diagnosis present

## 2015-03-13 DIAGNOSIS — A419 Sepsis, unspecified organism: Principal | ICD-10-CM | POA: Diagnosis present

## 2015-03-13 DIAGNOSIS — M791 Myalgia: Secondary | ICD-10-CM | POA: Diagnosis present

## 2015-03-13 DIAGNOSIS — E0500 Thyrotoxicosis with diffuse goiter without thyrotoxic crisis or storm: Secondary | ICD-10-CM | POA: Diagnosis present

## 2015-03-13 DIAGNOSIS — K5900 Constipation, unspecified: Secondary | ICD-10-CM | POA: Diagnosis present

## 2015-03-13 DIAGNOSIS — J90 Pleural effusion, not elsewhere classified: Secondary | ICD-10-CM | POA: Diagnosis present

## 2015-03-13 DIAGNOSIS — R51 Headache: Secondary | ICD-10-CM | POA: Diagnosis present

## 2015-03-13 DIAGNOSIS — D649 Anemia, unspecified: Secondary | ICD-10-CM | POA: Diagnosis present

## 2015-03-13 DIAGNOSIS — J811 Chronic pulmonary edema: Secondary | ICD-10-CM | POA: Diagnosis present

## 2015-03-13 DIAGNOSIS — R7881 Bacteremia: Secondary | ICD-10-CM | POA: Diagnosis not present

## 2015-03-13 DIAGNOSIS — R740 Nonspecific elevation of levels of transaminase and lactic acid dehydrogenase [LDH]: Secondary | ICD-10-CM | POA: Diagnosis present

## 2015-03-13 DIAGNOSIS — M25461 Effusion, right knee: Secondary | ICD-10-CM | POA: Diagnosis present

## 2015-03-13 DIAGNOSIS — J9811 Atelectasis: Secondary | ICD-10-CM | POA: Diagnosis present

## 2015-03-13 DIAGNOSIS — Z79899 Other long term (current) drug therapy: Secondary | ICD-10-CM

## 2015-03-13 DIAGNOSIS — R5381 Other malaise: Secondary | ICD-10-CM | POA: Diagnosis present

## 2015-03-13 LAB — CBC WITH DIFFERENTIAL/PLATELET
BASOS ABS: 0 10*3/uL (ref 0.0–0.1)
Basophils Relative: 1 % (ref 0–1)
EOS PCT: 2 % (ref 0–5)
Eosinophils Absolute: 0.1 10*3/uL (ref 0.0–0.7)
HCT: 32 % — ABNORMAL LOW (ref 36.0–46.0)
Hemoglobin: 10.5 g/dL — ABNORMAL LOW (ref 12.0–15.0)
Lymphocytes Relative: 18 % (ref 12–46)
Lymphs Abs: 0.8 10*3/uL (ref 0.7–4.0)
MCH: 27.4 pg (ref 26.0–34.0)
MCHC: 32.8 g/dL (ref 30.0–36.0)
MCV: 83.6 fL (ref 78.0–100.0)
MPV: 9.1 fL (ref 8.6–12.4)
Monocytes Absolute: 0.4 10*3/uL (ref 0.1–1.0)
Monocytes Relative: 9 % (ref 3–12)
Neutro Abs: 3.1 10*3/uL (ref 1.7–7.7)
Neutrophils Relative %: 70 % (ref 43–77)
PLATELETS: 624 10*3/uL — AB (ref 150–400)
RBC: 3.83 MIL/uL — ABNORMAL LOW (ref 3.87–5.11)
RDW: 13.4 % (ref 11.5–15.5)
WBC: 4.4 10*3/uL (ref 4.0–10.5)

## 2015-03-13 LAB — M. TUBERCULOSIS COMPLEX BY PCR

## 2015-03-13 LAB — C3 AND C4
C3 Complement: 176 mg/dL (ref 90–180)
C4 Complement: 35 mg/dL (ref 10–40)

## 2015-03-13 LAB — CYCLIC CITRUL PEPTIDE ANTIBODY, IGG: Cyclic Citrullin Peptide Ab: <2 U/mL (ref 0.0–5.0)

## 2015-03-13 LAB — ANTI-SMITH ANTIBODY: ENA SM AB SER-ACNC: NEGATIVE

## 2015-03-13 NOTE — Telephone Encounter (Signed)
No; study was added to PLEURAL FLUID.  It cannot be recollected unless we repeat the thoracentesis.

## 2015-03-13 NOTE — Telephone Encounter (Signed)
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 They however are needing it recollected in order to do the test as the sample has been discarded already. It looks like infectious disease tried to have this done. Would you like [place] to contact the pt and or the other physician in regards to this ?  AC

## 2015-03-14 ENCOUNTER — Encounter (HOSPITAL_COMMUNITY): Payer: Self-pay | Admitting: *Deleted

## 2015-03-14 ENCOUNTER — Inpatient Hospital Stay (HOSPITAL_COMMUNITY)
Admission: EM | Admit: 2015-03-14 | Discharge: 2015-03-26 | DRG: 854 | Disposition: A | Discharge status: Home / Self Care | Payer: 59 | Attending: Internal Medicine | Admitting: Internal Medicine

## 2015-03-14 ENCOUNTER — Emergency Department (HOSPITAL_COMMUNITY): Payer: 59

## 2015-03-14 DIAGNOSIS — A419 Sepsis, unspecified organism: Secondary | ICD-10-CM | POA: Diagnosis present

## 2015-03-14 DIAGNOSIS — R509 Fever, unspecified: Secondary | ICD-10-CM | POA: Insufficient documentation

## 2015-03-14 DIAGNOSIS — M791 Myalgia: Secondary | ICD-10-CM | POA: Diagnosis present

## 2015-03-14 DIAGNOSIS — R7401 Elevation of levels of liver transaminase levels: Secondary | ICD-10-CM | POA: Diagnosis present

## 2015-03-14 DIAGNOSIS — R590 Localized enlarged lymph nodes: Secondary | ICD-10-CM | POA: Diagnosis present

## 2015-03-14 DIAGNOSIS — B9561 Methicillin susceptible Staphylococcus aureus infection as the cause of diseases classified elsewhere: Secondary | ICD-10-CM | POA: Diagnosis not present

## 2015-03-14 DIAGNOSIS — M25561 Pain in right knee: Secondary | ICD-10-CM | POA: Diagnosis present

## 2015-03-14 DIAGNOSIS — R740 Nonspecific elevation of levels of transaminase and lactic acid dehydrogenase [LDH]: Secondary | ICD-10-CM

## 2015-03-14 DIAGNOSIS — E049 Nontoxic goiter, unspecified: Secondary | ICD-10-CM | POA: Diagnosis present

## 2015-03-14 DIAGNOSIS — B377 Candidal sepsis: Secondary | ICD-10-CM | POA: Diagnosis not present

## 2015-03-14 DIAGNOSIS — R0602 Shortness of breath: Secondary | ICD-10-CM | POA: Insufficient documentation

## 2015-03-14 DIAGNOSIS — A1889 Tuberculosis of other sites: Secondary | ICD-10-CM | POA: Insufficient documentation

## 2015-03-14 DIAGNOSIS — A411 Sepsis due to other specified staphylococcus: Secondary | ICD-10-CM

## 2015-03-14 DIAGNOSIS — B958 Unspecified staphylococcus as the cause of diseases classified elsewhere: Secondary | ICD-10-CM | POA: Diagnosis not present

## 2015-03-14 DIAGNOSIS — A159 Respiratory tuberculosis unspecified: Secondary | ICD-10-CM | POA: Insufficient documentation

## 2015-03-14 DIAGNOSIS — M1990 Unspecified osteoarthritis, unspecified site: Secondary | ICD-10-CM | POA: Diagnosis present

## 2015-03-14 DIAGNOSIS — R7881 Bacteremia: Secondary | ICD-10-CM | POA: Diagnosis present

## 2015-03-14 DIAGNOSIS — E46 Unspecified protein-calorie malnutrition: Secondary | ICD-10-CM | POA: Diagnosis present

## 2015-03-14 DIAGNOSIS — B9689 Other specified bacterial agents as the cause of diseases classified elsewhere: Secondary | ICD-10-CM | POA: Diagnosis not present

## 2015-03-14 DIAGNOSIS — M2540 Effusion, unspecified joint: Secondary | ICD-10-CM | POA: Diagnosis not present

## 2015-03-14 DIAGNOSIS — R61 Generalized hyperhidrosis: Secondary | ICD-10-CM | POA: Diagnosis present

## 2015-03-14 DIAGNOSIS — M25461 Effusion, right knee: Secondary | ICD-10-CM

## 2015-03-14 DIAGNOSIS — M25469 Effusion, unspecified knee: Secondary | ICD-10-CM | POA: Diagnosis present

## 2015-03-14 DIAGNOSIS — R7611 Nonspecific reaction to tuberculin skin test without active tuberculosis: Secondary | ICD-10-CM | POA: Diagnosis present

## 2015-03-14 DIAGNOSIS — M25562 Pain in left knee: Secondary | ICD-10-CM | POA: Diagnosis present

## 2015-03-14 DIAGNOSIS — B957 Other staphylococcus as the cause of diseases classified elsewhere: Secondary | ICD-10-CM | POA: Diagnosis present

## 2015-03-14 DIAGNOSIS — A499 Bacterial infection, unspecified: Secondary | ICD-10-CM | POA: Diagnosis not present

## 2015-03-14 DIAGNOSIS — R51 Headache: Secondary | ICD-10-CM | POA: Diagnosis present

## 2015-03-14 DIAGNOSIS — Z6822 Body mass index (BMI) 22.0-22.9, adult: Secondary | ICD-10-CM | POA: Diagnosis not present

## 2015-03-14 DIAGNOSIS — R000 Tachycardia, unspecified: Secondary | ICD-10-CM

## 2015-03-14 DIAGNOSIS — Z789 Other specified health status: Secondary | ICD-10-CM | POA: Diagnosis not present

## 2015-03-14 DIAGNOSIS — D649 Anemia, unspecified: Secondary | ICD-10-CM | POA: Diagnosis present

## 2015-03-14 DIAGNOSIS — R591 Generalized enlarged lymph nodes: Secondary | ICD-10-CM | POA: Diagnosis present

## 2015-03-14 DIAGNOSIS — R5381 Other malaise: Secondary | ICD-10-CM | POA: Diagnosis present

## 2015-03-14 DIAGNOSIS — Z79899 Other long term (current) drug therapy: Secondary | ICD-10-CM | POA: Diagnosis not present

## 2015-03-14 DIAGNOSIS — Z227 Latent tuberculosis: Secondary | ICD-10-CM | POA: Diagnosis present

## 2015-03-14 DIAGNOSIS — A150 Tuberculosis of lung: Secondary | ICD-10-CM | POA: Diagnosis not present

## 2015-03-14 DIAGNOSIS — J90 Pleural effusion, not elsewhere classified: Secondary | ICD-10-CM | POA: Diagnosis present

## 2015-03-14 DIAGNOSIS — J9811 Atelectasis: Secondary | ICD-10-CM | POA: Diagnosis present

## 2015-03-14 DIAGNOSIS — E0500 Thyrotoxicosis with diffuse goiter without thyrotoxic crisis or storm: Secondary | ICD-10-CM | POA: Diagnosis present

## 2015-03-14 DIAGNOSIS — R600 Localized edema: Secondary | ICD-10-CM | POA: Diagnosis present

## 2015-03-14 DIAGNOSIS — M2550 Pain in unspecified joint: Secondary | ICD-10-CM | POA: Diagnosis not present

## 2015-03-14 DIAGNOSIS — E041 Nontoxic single thyroid nodule: Secondary | ICD-10-CM | POA: Diagnosis present

## 2015-03-14 DIAGNOSIS — J811 Chronic pulmonary edema: Secondary | ICD-10-CM | POA: Diagnosis present

## 2015-03-14 DIAGNOSIS — N912 Amenorrhea, unspecified: Secondary | ICD-10-CM | POA: Diagnosis present

## 2015-03-14 DIAGNOSIS — K5900 Constipation, unspecified: Secondary | ICD-10-CM | POA: Diagnosis present

## 2015-03-14 DIAGNOSIS — I881 Chronic lymphadenitis, except mesenteric: Secondary | ICD-10-CM

## 2015-03-14 DIAGNOSIS — I888 Other nonspecific lymphadenitis: Secondary | ICD-10-CM

## 2015-03-14 DIAGNOSIS — R799 Abnormal finding of blood chemistry, unspecified: Secondary | ICD-10-CM | POA: Diagnosis not present

## 2015-03-14 DIAGNOSIS — F22 Delusional disorders: Secondary | ICD-10-CM

## 2015-03-14 DIAGNOSIS — M7989 Other specified soft tissue disorders: Secondary | ICD-10-CM | POA: Diagnosis present

## 2015-03-14 DIAGNOSIS — R7989 Other specified abnormal findings of blood chemistry: Secondary | ICD-10-CM | POA: Insufficient documentation

## 2015-03-14 HISTORY — DX: Chronic lymphadenitis, except mesenteric: I88.1

## 2015-03-14 LAB — COMPREHENSIVE METABOLIC PANEL
ALK PHOS: 114 U/L (ref 39–117)
ALT: 58 U/L — ABNORMAL HIGH (ref 0–35)
AST: 61 U/L — AB (ref 0–37)
Albumin: 2.5 g/dL — ABNORMAL LOW (ref 3.5–5.2)
Anion gap: 11 (ref 5–15)
BILIRUBIN TOTAL: 1.2 mg/dL (ref 0.3–1.2)
BUN: 8 mg/dL (ref 6–23)
CO2: 21 mmol/L (ref 19–32)
Calcium: 8.8 mg/dL (ref 8.4–10.5)
Chloride: 105 mmol/L (ref 96–112)
Creatinine, Ser: 0.56 mg/dL (ref 0.50–1.10)
Glucose, Bld: 98 mg/dL (ref 70–99)
Potassium: 4.7 mmol/L (ref 3.5–5.1)
Sodium: 137 mmol/L (ref 135–145)
Total Protein: 7.2 g/dL (ref 6.0–8.3)

## 2015-03-14 LAB — CBC WITH DIFFERENTIAL/PLATELET
BASOS ABS: 0 10*3/uL (ref 0.0–0.1)
BASOS PCT: 0 % (ref 0–1)
Eosinophils Absolute: 0.2 10*3/uL (ref 0.0–0.7)
Eosinophils Relative: 5 % (ref 0–5)
HCT: 31.8 % — ABNORMAL LOW (ref 36.0–46.0)
Hemoglobin: 10.2 g/dL — ABNORMAL LOW (ref 12.0–15.0)
LYMPHS PCT: 25 % (ref 12–46)
Lymphs Abs: 1.1 10*3/uL (ref 0.7–4.0)
MCH: 26.8 pg (ref 26.0–34.0)
MCHC: 32.1 g/dL (ref 30.0–36.0)
MCV: 83.7 fL (ref 78.0–100.0)
MONO ABS: 0.4 10*3/uL (ref 0.1–1.0)
MONOS PCT: 10 % (ref 3–12)
Neutro Abs: 2.6 10*3/uL (ref 1.7–7.7)
Neutrophils Relative %: 60 % (ref 43–77)
Platelets: 522 10*3/uL — ABNORMAL HIGH (ref 150–400)
RBC: 3.8 MIL/uL — AB (ref 3.87–5.11)
RDW: 13.1 % (ref 11.5–15.5)
WBC: 4.3 10*3/uL (ref 4.0–10.5)

## 2015-03-14 LAB — URINALYSIS, ROUTINE W REFLEX MICROSCOPIC
Bilirubin Urine: NEGATIVE
Glucose, UA: NEGATIVE mg/dL
Hgb urine dipstick: NEGATIVE
Ketones, ur: NEGATIVE mg/dL
Leukocytes, UA: NEGATIVE
NITRITE: NEGATIVE
Protein, ur: NEGATIVE mg/dL
Specific Gravity, Urine: 1.017 (ref 1.005–1.030)
UROBILINOGEN UA: 1 mg/dL (ref 0.0–1.0)
pH: 5 (ref 5.0–8.0)

## 2015-03-14 LAB — I-STAT CG4 LACTIC ACID, ED: Lactic Acid, Venous: 2.22 mmol/L (ref 0.5–2.0)

## 2015-03-14 MED ORDER — SODIUM CHLORIDE 0.9 % IV SOLN
INTRAVENOUS | Status: DC
  Administered 2015-03-14: 10:00:00 via INTRAVENOUS
  Administered 2015-03-14 – 2015-03-15 (×2): 125 mL/h via INTRAVENOUS
  Administered 2015-03-15: 11:00:00 via INTRAVENOUS
  Administered 2015-03-16: 125 mL/h via INTRAVENOUS
  Administered 2015-03-16 – 2015-03-20 (×10): via INTRAVENOUS

## 2015-03-14 MED ORDER — SODIUM CHLORIDE 0.9 % IV BOLUS (SEPSIS)
1000.0000 mL | INTRAVENOUS | Status: AC
  Administered 2015-03-14 (×2): 1000 mL via INTRAVENOUS

## 2015-03-14 MED ORDER — PIPERACILLIN-TAZOBACTAM 3.375 G IVPB
3.3750 g | Freq: Three times a day (TID) | INTRAVENOUS | Status: DC
  Filled 2015-03-14: qty 50

## 2015-03-14 MED ORDER — INFLUENZA VAC SPLIT QUAD 0.5 ML IM SUSY
0.5000 mL | PREFILLED_SYRINGE | INTRAMUSCULAR | Status: AC
  Administered 2015-03-15: 0.5 mL via INTRAMUSCULAR
  Filled 2015-03-14: qty 0.5

## 2015-03-14 MED ORDER — HEPARIN SODIUM (PORCINE) 5000 UNIT/ML IJ SOLN
5000.0000 [IU] | Freq: Three times a day (TID) | INTRAMUSCULAR | Status: DC
  Administered 2015-03-14 – 2015-03-16 (×7): 5000 [IU] via SUBCUTANEOUS
  Filled 2015-03-14 (×7): qty 1

## 2015-03-14 MED ORDER — SODIUM CHLORIDE 0.9 % IV BOLUS (SEPSIS)
1000.0000 mL | Freq: Once | INTRAVENOUS | Status: AC
  Administered 2015-03-14: 1000 mL via INTRAVENOUS

## 2015-03-14 MED ORDER — PIPERACILLIN-TAZOBACTAM 3.375 G IVPB 30 MIN
3.3750 g | Freq: Once | INTRAVENOUS | Status: AC
  Administered 2015-03-14: 3.375 g via INTRAVENOUS
  Filled 2015-03-14: qty 50

## 2015-03-14 MED ORDER — PNEUMOCOCCAL VAC POLYVALENT 25 MCG/0.5ML IJ INJ
0.5000 mL | INJECTION | INTRAMUSCULAR | Status: AC
  Administered 2015-03-15: 0.5 mL via INTRAMUSCULAR

## 2015-03-14 MED ORDER — VANCOMYCIN HCL IN DEXTROSE 750-5 MG/150ML-% IV SOLN
750.0000 mg | Freq: Two times a day (BID) | INTRAVENOUS | Status: DC
  Administered 2015-03-14 – 2015-03-16 (×5): 750 mg via INTRAVENOUS
  Filled 2015-03-14 (×6): qty 150

## 2015-03-14 MED ORDER — ACETAMINOPHEN 650 MG RE SUPP
650.0000 mg | Freq: Four times a day (QID) | RECTAL | Status: DC | PRN
  Administered 2015-03-20: 650 mg via RECTAL
  Filled 2015-03-14: qty 1

## 2015-03-14 MED ORDER — CEFAZOLIN SODIUM-DEXTROSE 2-3 GM-% IV SOLR
2.0000 g | Freq: Three times a day (TID) | INTRAVENOUS | Status: DC
  Administered 2015-03-14 – 2015-03-26 (×37): 2 g via INTRAVENOUS
  Filled 2015-03-14 (×42): qty 50

## 2015-03-14 MED ORDER — ACETAMINOPHEN 325 MG PO TABS
650.0000 mg | ORAL_TABLET | Freq: Four times a day (QID) | ORAL | Status: DC | PRN
  Administered 2015-03-16 – 2015-03-21 (×9): 650 mg via ORAL
  Filled 2015-03-14 (×9): qty 2

## 2015-03-14 MED ORDER — PIPERACILLIN-TAZOBACTAM 3.375 G IVPB
3.3750 g | Freq: Three times a day (TID) | INTRAVENOUS | Status: DC
  Filled 2015-03-14 (×3): qty 50

## 2015-03-14 NOTE — ED Notes (Signed)
The pts husband reports that the doctor called here and talked to someone about the reason she was being sent here

## 2015-03-14 NOTE — Progress Notes (Signed)
VASCULAR LAB PRELIMINARY  PRELIMINARY  PRELIMINARY  PRELIMINARY  Bilateral lower extremity venous Dopplers completed.    Preliminary report:  There is no obvious evidence of DVT or SVT noted in the bilateral lower extremities.   Deshan Hemmelgarn, RVT 03/14/2015, 10:51 AM

## 2015-03-14 NOTE — ED Provider Notes (Signed)
CSN: [id number]     Arrival date & time 03/13/15  2346 History   First MD Initiated Contact with Patient 03/14/15 0208     This chart was scribed for Lavina Hamman, MD by Forrestine Him, ED Scribe. This patient was seen in room D35C/D35C and the patient's care was started 2:13 AM.   Chief Complaint  Patient presents with  . Abnormal Lab   The history is provided by the patient. No language interpreter was used.    HPI Comments: Nichole Perry is a 43 y.o. female with a PMHx of anemia who presents to the Emergency Department here for abnormal labs this evening. Pt was called at home and was told she had a bacterial blood infection and needed to come to hospital for further evaluation. Prior to diagnosis pt experienced intermittent fevers ranging from 101 .5-101.9 along with myalgias and extremity swelling x 3 weeks. Pt was evaluated by Dr. Tamala Julian and had full work up. No SOB, CP, nausea, or vomiting. No known allergies to medications.  Based on chart documentation, she has been worked up for cough, dyspnea on exertion, arthralgias, and night sweats. She had a pleural effusion that was tapped. She's had multiple negative serologic testing including HIV, ANA, RF, TSH. Blood cultures were obtained and are growing gram-positive cocci in clusters.   Past Medical History  Diagnosis Date  . Anemia   . Goiter 02/07/2012   Past Surgical History  Procedure Laterality Date  . Back surgery     Family History  Problem Relation Age of Onset  . Hypertension Mother   . Diabetes Father    History  Substance Use Topics  . Smoking status: Never Smoker   . Smokeless tobacco: Never Used  . Alcohol Use: No   OB History    Gravida Para Term Preterm AB TAB SAB Ectopic Multiple Living   0 0 0 0 0 0 0 0 0 0      Review of Systems  Constitutional: Positive for fever.  Respiratory: Positive for cough and shortness of breath. Negative for chest tightness.   Cardiovascular: Positive for leg swelling.  Negative for chest pain.  Gastrointestinal: Negative for nausea, vomiting and abdominal pain.  Genitourinary: Negative for dysuria.  Musculoskeletal: Positive for myalgias and arthralgias. Negative for back pain.  Skin: Negative for rash and wound.  Neurological: Negative for headaches.  Psychiatric/Behavioral: Negative for confusion.  All other systems reviewed and are negative.     Allergies  Review of patient's allergies indicates no known allergies.  Home Medications   Prior to Admission medications   Medication Sig Start Date End Date Taking? Authorizing Provider  furosemide (LASIX) 20 MG tablet Take 1 tablet (20 mg total) by mouth daily. Patient not taking: Reported on 03/12/2015 02/21/15   Wardell Honour, MD  meloxicam (MOBIC) 15 MG tablet Take 1 tablet (15 mg total) by mouth daily. 03/12/15   Wardell Honour, MD  potassium chloride SA (K-DUR,KLOR-CON) 20 MEQ tablet Take 1 tablet (20 mEq total) by mouth daily. Patient not taking: Reported on 03/12/2015 02/21/15   Wardell Honour, MD  VITAMIN D, CHOLECALCIFEROL, PO Take 1 capsule by mouth daily.    Historical Provider, MD   Triage Vitals: BP 115/69 mmHg  Pulse 93  Temp(Src) 98.6 F (37 C) (Oral)  Resp 27  Ht 5\' 2"  (1.575 m)  Wt 118 lb (53.524 kg)  BMI 21.58 kg/m2  SpO2 100%  LMP 11/19/2014   Physical Exam  Constitutional: She is oriented  to person, place, and time. No distress.  Ill-appearing but nontoxic  HENT:  Head: Normocephalic and atraumatic.  Mouth/Throat: Oropharynx is clear and moist.  Eyes: Pupils are equal, round, and reactive to light.  Neck: Neck supple.  Cardiovascular: Normal rate, regular rhythm and normal heart sounds.   No murmur heard. Pulmonary/Chest: Effort normal. No respiratory distress. She has no wheezes.  Decreased breath sounds at the bases  Abdominal: Soft. Bowel sounds are normal. There is no tenderness. There is no rebound.  Musculoskeletal:  Tenderness palpation of the bilateral wrists  and knees  Neurological: She is alert and oriented to person, place, and time.  Skin: Skin is warm and dry.  Psychiatric: She has a normal mood and affect.  Nursing note and vitals reviewed.   ED Course  Procedures (including critical care time)  DIAGNOSTIC STUDIES: Oxygen Saturation is 100% on RA, Normal by my interpretation.    COORDINATION OF CARE: 4:17 AM-Discussed treatment plan with pt at bedside and pt agreed to plan.     Labs Review Labs Reviewed  CBC WITH DIFFERENTIAL/PLATELET - Abnormal; Notable for the following:    RBC 3.80 (*)    Hemoglobin 10.2 (*)    HCT 31.8 (*)    Platelets 522 (*)    All other components within normal limits  COMPREHENSIVE METABOLIC PANEL - Abnormal; Notable for the following:    Albumin 2.5 (*)    AST 61 (*)    ALT 58 (*)    All other components within normal limits  I-STAT CG4 LACTIC ACID, ED - Abnormal; Notable for the following:    Lactic Acid, Venous 2.22 (*)    All other components within normal limits  CULTURE, BLOOD (ROUTINE X 2)  CULTURE, BLOOD (ROUTINE X 2)  URINE CULTURE  URINALYSIS, ROUTINE W REFLEX MICROSCOPIC    Imaging Review Dg Chest 2 View  03/13/2015   CLINICAL DATA:  Cough  EXAM: CHEST  2 VIEW  COMPARISON:  March 03, 2015 chest radiograph and chest CT February 21, 2015  FINDINGS: There is a small pleural effusion on the right. No pneumothorax appreciable. There is mild bibasilar atelectasis. Lungs are otherwise clear. Heart size and pulmonary vascularity are normal. No adenopathy. There is evidence of sclerosis in several ribs consistent with prior rib trauma. No acute fracture apparent. Nipple shadows are noted bilaterally.  IMPRESSION: Small right pleural effusion. Mild bibasilar atelectasis. No airspace consolidation or edema.   Electronically Signed   By: Lowella Grip III M.D.   On: 03/13/2015 09:20   Dg Chest Port 1 View  03/14/2015   CLINICAL DATA:  Abnormal labs, suspicious for infection.  EXAM: PORTABLE CHEST - 1  VIEW  COMPARISON:  03/12/2015  FINDINGS: There is persistent right pleural fluid or thickening in the lateral base. Mild linear basilar opacities persist and may represent scarring. Lungs are otherwise clear. Hilar, mediastinal and cardiac contours are unremarkable and unchanged. Pulmonary vasculature is normal.  IMPRESSION: Unchanged, would minimal linear basilar opacities and right lateral pleural fluid/thickening.   Electronically Signed   By: Andreas Newport M.D.   On: 03/14/2015 03:09     EKG Interpretation None      MDM   Final diagnoses:  Bacteremia    Patient presents with positive blood cultures with gram-positive cocci. Recent history of fever, arthralgia, myalgias.  Ill-appearing but nontoxic on exam. Vital signs are reassuring.  Lab work and blood cultures obtained. Patient given empirically including vancomycin and Zosyn. Lactate mildly elevated at 2.2.  Patient given fluids. Discussed with Dr. Posey Pronto for admit. Will place on isolation precautions given concerned for possible TB.   I personally performed the services described in this documentation, which was scribed in my presence. The recorded information has been reviewed and is accurate.   Merryl Hacker, MD 03/14/15 [phone]

## 2015-03-14 NOTE — ED Notes (Signed)
Dr Dina Rich given lactic acid result 2.22

## 2015-03-14 NOTE — ED Notes (Signed)
The pt was called at home and was told she had an infection and needed to come to the hospital.  Lt knee pain lmp  3 months ago  Not preg

## 2015-03-14 NOTE — H&P (Signed)
[facility] History and Physical  Patient: Nichole Perry  MRN: [id number]  DOB: 1970-12-14  DOS: the patient was seen and examined on 03/14/2015 PCP: Maximino Greenland, MD  Chief Complaint: Positive blood cultures  HPI: Nichole Perry is a 44 y.o. female with Past medical history of anemia and goiter. The patient is presenting with complaints of positive blood culture and fever. Patient's husband helped interpretation. Patient has multiple complaints. Earlier in March she started having complaints of bilateral knee pain with bilateral swelling. She has been having chronic cough at least since December. She also complains of shortness of breath. She initially had some occasional chest pain but since last few weeks she is chest pain-free. Does not have any nausea or vomiting or abdominal pain or diarrhea or constipation or active bleeding or black color bowel movement. Does not have any burning urination. Pregnancy test negative on 03/05/2015. She complains of some muscle aches as well as some morning stiffness. Patient was diagnosed with history of Graves' disease in 2010. She also complains of drenching night sweats. She has evening fever with temperature ranging 101 203F. She also has significant chills and shakes. She complains of mild headache without any runny nose or congestion. No focal deficit. Occasional dizziness which is also resolved no vertigo. Patient did have a thyroid nodule biopsy which resulted benign. Patient was diagnosed with right axillary lymph node. It is scheduled to get outpatient workup. During all this she had a thoracentesis due to bilateral pleural effusion, the fluid was negative for any infection, AFB culture was also performed which is also negative. HIV negative in a negative rheumatoid factor negative. TSH normal. ESR was 105.  The patient is coming from home. And at her baseline independent for most of her ADL.  Review of Systems: as mentioned in  the history of present illness.  A Comprehensive review of the other systems is negative.  Past Medical History  Diagnosis Date  . Anemia   . Goiter 02/07/2012   Past Surgical History  Procedure Laterality Date  . Back surgery     Social History:  reports that she has never smoked. She has never used smokeless tobacco. She reports that she does not drink alcohol or use illicit drugs.  No Known Allergies  Family History  Problem Relation Age of Onset  . Hypertension Mother   . Diabetes Father     Prior to Admission medications   Medication Sig Start Date End Date Taking? Authorizing Provider  acetaminophen (TYLENOL) 500 MG tablet Take 500 mg by mouth every 6 (six) hours as needed for fever.   Yes Historical Provider, MD  furosemide (LASIX) 20 MG tablet Take 1 tablet (20 mg total) by mouth daily. 02/21/15  Yes Wardell Honour, MD  meloxicam (MOBIC) 15 MG tablet Take 1 tablet (15 mg total) by mouth daily. 03/12/15  Yes Wardell Honour, MD  PRESCRIPTION MEDICATION Take 1 tablet by mouth daily.   Yes Historical Provider, MD  potassium chloride SA (K-DUR,KLOR-CON) 20 MEQ tablet Take 1 tablet (20 mEq total) by mouth daily. Patient not taking: Reported on 03/12/2015 02/21/15   Wardell Honour, MD  VITAMIN D, CHOLECALCIFEROL, PO Take 1 capsule by mouth daily.    Historical Provider, MD    Physical Exam: Filed Vitals:   03/14/15 0345 03/14/15 0400 03/14/15 0415 03/14/15 0517  BP: 103/59 104/64  94/51  Pulse:  97 89 94  Temp:    99.7 F (37.6 C)  TempSrc:    Oral  Resp: 22 18 18 18   Height:      Weight:    56.1 kg (123 lb 10.9 oz)  SpO2:  100% 99% 100%    General: Alert, Awake and Oriented to Time, Place and Person. Appear in mild distress Eyes: PERRL ENT: Oral Mucosa clear moist. Neck: no JVD Cardiovascular: S1 and S2 Present, no Murmur, Peripheral Pulses Present Respiratory: Bilateral Air entry equal and Decreased, Clear to Auscultation, noCrackles, no wheezes Abdomen: Bowel Sound  present, Soft and non tender Skin: no Rash Extremities: no Pedal edema, no calf tenderness Neurologic: Grossly no focal neuro deficit.  Labs on Admission:  CBC:  Recent Labs Lab 03/12/15 1325 03/14/15 0239  WBC 4.4 4.3  NEUTROABS 3.1 2.6  HGB 10.5* 10.2*  HCT 32.0* 31.8*  MCV 83.6 83.7  PLT 624* 522*    CMP     Component Value Date/Time   NA 137 03/14/2015 0239   NA 139 03/05/2013 1035   K 4.7 03/14/2015 0239   K 4.0 03/05/2013 1035   CL 105 03/14/2015 0239   CL 104 03/05/2013 1035   CO2 21 03/14/2015 0239   CO2 27 03/05/2013 1035   GLUCOSE 98 03/14/2015 0239   GLUCOSE 79 03/05/2013 1035   BUN 8 03/14/2015 0239   BUN 11.2 03/05/2013 1035   CREATININE 0.56 03/14/2015 0239   CREATININE 0.53 03/12/2015 1325   CREATININE 0.6 03/05/2013 1035   CALCIUM 8.8 03/14/2015 0239   CALCIUM 9.6 03/05/2013 1035   PROT 7.2 03/14/2015 0239   PROT 7.8 03/05/2013 1035   ALBUMIN 2.5* 03/14/2015 0239   ALBUMIN 3.4* 03/05/2013 1035   AST 61* 03/14/2015 0239   AST 14 03/05/2013 1035   ALT 58* 03/14/2015 0239   ALT 10 03/05/2013 1035   ALKPHOS 114 03/14/2015 0239   ALKPHOS 70 03/05/2013 1035   BILITOT 1.2 03/14/2015 0239   BILITOT <0.20 03/05/2013 1035   GFRNONAA >90 03/14/2015 0239   GFRAA >90 03/14/2015 0239    No results for input(s): LIPASE, AMYLASE in the last 168 hours.  No results for input(s): CKTOTAL, CKMB, CKMBINDEX, TROPONINI in the last 168 hours. BNP (last 3 results) No results for input(s): BNP in the last 8760 hours.  ProBNP (last 3 results) No results for input(s): PROBNP in the last 8760 hours.   Radiological Exams on Admission: Dg Chest 2 View  03/13/2015   CLINICAL DATA:  Cough  EXAM: CHEST  2 VIEW  COMPARISON:  March 03, 2015 chest radiograph and chest CT February 21, 2015  FINDINGS: There is a small pleural effusion on the right. No pneumothorax appreciable. There is mild bibasilar atelectasis. Lungs are otherwise clear. Heart size and pulmonary vascularity  are normal. No adenopathy. There is evidence of sclerosis in several ribs consistent with prior rib trauma. No acute fracture apparent. Nipple shadows are noted bilaterally.  IMPRESSION: Small right pleural effusion. Mild bibasilar atelectasis. No airspace consolidation or edema.   Electronically Signed   By: Lowella Grip III M.D.   On: 03/13/2015 09:20   Dg Chest Port 1 View  03/14/2015   CLINICAL DATA:  Abnormal labs, suspicious for infection.  EXAM: PORTABLE CHEST - 1 VIEW  COMPARISON:  03/12/2015  FINDINGS: There is persistent right pleural fluid or thickening in the lateral base. Mild linear basilar opacities persist and may represent scarring. Lungs are otherwise clear. Hilar, mediastinal and cardiac contours are unremarkable and unchanged. Pulmonary vasculature is normal.  IMPRESSION: Unchanged, would minimal linear basilar opacities and right lateral  pleural fluid/thickening.   Electronically Signed   By: Andreas Newport M.D.   On: 03/14/2015 03:09   Assessment/Plan Principal Problem:   Gram-positive bacteremia Active Problems:   Goiter   Bilateral pleural effusion   Arthralgia   Amenorrhea   LAD (lymphadenopathy), axillary   Bilateral edema of lower extremity   Other specified fever   1. Gram-positive bacteremia The patient is presenting with positive blood cultures seen as an outpatient. She has on and off fever occurring daily for last 3 weeks primarily occurring in the evening associated with chills. She is originally from [place] but has not left [place] in the last 4 years. She had an extensive workup done as an outpatient which shows an exudative pleural effusion negative rheumatological as well as negative HIV. ESR was 105. Currently she will be receiving vancomycin. Further workup to identify the etiology and further imaging will be dependent on infectious diseases consult. I have discussed the case with Dr. Megan Salon who will be following up with the patient today.  2.  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 Currently she is on respiratory isolation. Following infectious diseases recommendation.  Advance goals of care discussion: Full code   Consults: Infectious disease doctor [place]  DVT Prophylaxis: subcutaneous Heparin Nutrition: Regular diet  Family Communication: Husband was present at bedside, opportunity was given to ask question and all questions were answered satisfactorily at the time of interview. Disposition: Admitted to inpatient in med-surge unit.  Author: Berle Mull, MD [facility] Pager: [phone] 03/14/2015, 6:52 AM    If 7PM-7AM, please contact night-coverage [email or web address] Password [email or web address]

## 2015-03-14 NOTE — Progress Notes (Addendum)
PROGRESS NOTE  Nichole Perry XYB:[id number] DOB: January 16, 1971 DOA: 03/14/2015 PCP: Maximino Greenland, MD  Brief history 44 year old female with a history of goiter and anemia presented to the hospital when she was called by her primary care provider with positive blood cultures. The patient has not been feeling well for nearly 3 weeks. The patient initially presented to her primary care provider because of bilateral knee pain and leg swelling on 02/21/2015. She was started on furosemide 20 mg daily. Chest x-ray at that time revealed small to moderate bilateral pleural effusions. CT angio of the chest was performed on 02/21/2015 which was negative for pulmonary embolus but revealed bilateral pleural effusions and central bronchial wall thickening and enlarged right axillary lymph node. The patient followed up on 03/07/2015. She underwent thoracocentesis on 03/03/2015. Fluid chemistries revealed an exudative pleural effusion with WBC 3000 (64% monos, 28% PMN, 8% eosinophils).  Routine Fluid culture and AFB culture are negative at this time. 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 The patient represented to the primary care provider office on 03/12/2015 with 2 day history of fever up to 101.24F. Blood cultures were obtained at that time. Notably, the patient also was noted to have a right thyroid nodule which has been biopsied on 03/10/2015. The biopsy revealed benign follicular nodule. Assessment/Plan: Sepsis -Present at the time of admission -Secondary to bacteremia -Lactic acid 2.22 -Fluid resuscitation -Continue IV vancomycin -flu is neg Bacteremia -Preliminary results show positive cocci in clusters -ID has been consulted -echo -will need surveillance blood cultures if S. Aureus QuantiFeron Positive -given pt's clinical and epidemiologic hx, will need to r/o pulmonary TB -airborne isolation -consulted pulmonary for possible bronch with BAL Transaminasemia -secondary to sepsis -trend -No  abdominal pain, no vomiting -Hepatitis B surface antigen, hepatitis C antibody -HIV antibody is negative- Lower extremity edema and pain -Venous duplex r/o DVT Bilateral pleural effusion -03/03/2015 thoracocentesis--exudative - WBC 3000 (64% monos, 28% PMN, 8% eosinophils) Arthralgias and myalgias -Autoimmune workup has been negative including C3, C4, anti-CCP, ANA, RF, ENA,  -ask IR to possibly aspirate R-knee   Family Communication:   Husband updated at beside Disposition Plan:   Home when medically stable       Procedures/Studies: Dg Chest 1 View  03/03/2015   CLINICAL DATA:  Post right thoracentesis  EXAM: CHEST  1 VIEW  COMPARISON:  Chest x-ray of 02/21/2015  FINDINGS: Some of the right pleural effusion has been evacuated. No obvious pneumothorax is seen, but on the frontal view there is a fluid level at the right lung base and a small right hydro pneumothorax is suspected. Opacity at the left lung base persists consistent with left effusion, atelectasis, and possibly pneumonia. Heart size is stable.  IMPRESSION: 1. Suspect small right hydro pneumothorax after right thoracentesis. 2. Reduction of right pleural effusion.   Electronically Signed   By: Ivar Drape M.D.   On: 03/03/2015 16:17   Dg Chest 2 View  03/13/2015   CLINICAL DATA:  Cough  EXAM: CHEST  2 VIEW  COMPARISON:  March 03, 2015 chest radiograph and chest CT February 21, 2015  FINDINGS: There is a small pleural effusion on the right. No pneumothorax appreciable. There is mild bibasilar atelectasis. Lungs are otherwise clear. Heart size and pulmonary vascularity are normal. No adenopathy. There is evidence of sclerosis in several ribs consistent with prior rib trauma. No acute fracture apparent. Nipple shadows are noted bilaterally.  IMPRESSION: Small right pleural effusion. Mild bibasilar atelectasis. No  airspace consolidation or edema.   Electronically Signed   By: Lowella Grip III M.D.   On: 03/13/2015 09:20   Dg Chest  2 View  02/21/2015   CLINICAL DATA:  Chest pain and shortness of breath. Negative pregnancy test.  EXAM: CHEST  2 VIEW  COMPARISON:  12/05/2014  FINDINGS: Lungs are adequately inflated and demonstrate small to moderate bilateral pleural effusions right greater than left which are new. Likely associated atelectasis in the lung bases as basilar infection is also possible person cardiomediastinal silhouette and remainder of the exam is unchanged.  IMPRESSION: Small to moderate bilateral pleural effusions right greater than left. Likely associated bibasilar atelectasis versus infection.   Electronically Signed   By: Marin Olp M.D.   On: 02/21/2015 14:54   Ct Angio Chest W/cm &/or Wo Cm  02/21/2015   CLINICAL DATA:  3 day history of bilateral lower extremity edema, cough, and shortness of breath with intermittent stabbing chest pain radiating to the back.  EXAM: CT ANGIOGRAPHY CHEST WITH CONTRAST  TECHNIQUE: Multidetector CT imaging of the chest was performed using the standard protocol during bolus administration of intravenous contrast. Multiplanar CT image reconstructions and MIPs were obtained to evaluate the vascular anatomy.  CONTRAST:  142mL OMNIPAQUE IOHEXOL 350 MG/ML IV.  COMPARISON:  No prior CT. Two-view chest x-ray earlier same date and 12/05/2014.  FINDINGS: Contrast opacification of the pulmonary arteries is good respiratory motion blurred many of the images of the lung bases. Overall, the study is of good diagnostic quality.  No filling defects within either main pulmonary artery or their branches in either lung to suggest pulmonary embolism. Heart size upper normal. Small pericardial effusion. No visible atherosclerosis involving the thoracic or upper abdominal aorta or their visualized branches. No visible coronary artery atherosclerosis.  Large bilateral pleural effusions including of fluid in the major fissure on the left. Associated dense passive atelectasis in the lower lobes and right middle  lobe. No confluent airspace consolidation otherwise. No pulmonary parenchymal nodules or masses. Central airways patent with marked bronchial wall thickening.  Enlarged level 1 and level 2 right axillary lymph nodes, the largest measuring approximately 2.5 x 1.4 cm. No significant mediastinal, hilar or left axillary lymphadenopathy. Mass involving the lower pole of the left lobe of the thyroid gland measuring approximately 4.0 x 3.1 x 4.2 cm.  Visualized upper abdomen unremarkable for the early arterial phase of enhancement. Focus of accessory splenic tissue posterior to the mid spleen. Bone window images demonstrate a likely benign sclerotic bone island in the T6 vertebral body.  Review of the MIP images confirms the above findings.  IMPRESSION: 1. No evidence of pulmonary embolism. 2. Large bilateral pleural effusions including fluid in the major fissure on the left with associated passive atelectasis in the lower lobes and right middle lobe. 3. Marked central bronchial wall thickening consistent with severe bronchitis and/or asthma. 4. Small pericardial effusion. 5. Enlarged right axillary lymph nodes without evidence of lymphadenopathy elsewhere. Isolated unilateral axillary lymphadenopathy can be seen in breast cancer, so if not recently performed, diagnostic mammography may be helpful in further evaluation. 6. Approximate 4 cm mass involving the lower pole of the left lobe of the thyroid gland. Based on size, evaluation with ultrasound and likely biopsy is indicated. This follows ACR consensus guidelines: Managing Incidental Thyroid Nodules Detected on Imaging: White Paper of the ACR Incidental Thyroid Findings Committee. J Am Coll Radiol 2015; 12:143-150.   Electronically Signed   By: Sherran Needs.D.  On: 02/21/2015 19:06   US Soft Tissue Head/neck  02/25/2015   CLINICAL DATA:  Thyroid nodule.  EXAM: THYROID ULTRASOUND  TECHNIQUE: Ultrasound examination of the thyroid gland and adjacent soft tissues  was performed.  COMPARISON:  Chest CT 02/21/2015  FINDINGS: Right thyroid lobe  Measurements: 5.8 x 1.8 x 1.9 cm. There is a subtle heterogeneous nodule along the medial right thyroid lobe that measures 0.6 x 0.3 x 0.7 cm. There is a heterogeneous nodule in the inferior right thyroid lobe that measures 1.0 x 0.5 x 0.8 cm.  Left thyroid lobe  Measurements: 7.1 x 2.7 x 2.6 cm. There is a dominant nodule occupying the mid and lower aspect of the left thyroid lobe. This lesion corresponds with the nodule seen on the recent CT. This dominant left thyroid nodule measures 4.7 x 2.5 x 4.1 cm. Nodule is heterogeneous with small cystic areas. Cannot exclude small calcifications in this nodule.  Isthmus  Thickness: 0.4 cm.  No nodules visualized.  Lymphadenopathy  None visualized.  IMPRESSION: Bilateral thyroid nodules. There is a dominant left thyroid nodule that measures up to 4.7 cm. Findings meet consensus criteria for biopsy. Ultrasound-guided fine needle aspiration should be considered, as per the consensus statement: Management of Thyroid Nodules Detected at [place]: Society of Radiologists in [place]. Radiology 2005; [id number].   Electronically Signed   By: Markus Daft M.D.   On: 02/25/2015 16:21   US Thyroid Biopsy  03/10/2015   CLINICAL DATA:  44 year old female with a history of left-sided thyroid nodule. She has been referred for percutaneous biopsy.  EXAM: ULTRASOUND GUIDED NEEDLE ASPIRATE BIOPSY OF THE THYROID GLAND  COMPARISON:  02/25/2015  PROCEDURE: The procedure, risks, benefits, and alternatives were explained to the patient. Questions regarding the procedure were encouraged and answered. The patient understands and consents to the procedure.  Ultrasound survey was performed with images stored and sent to PACs.  The left neck was prepped with Betadine in a sterile fashion, and a sterile drape was applied covering the operative field. A sterile gown and sterile gloves were  used for the procedure. Local anesthesia was provided with 1% Lidocaine.  Ultrasound guidance was used to infiltrate the region with 1% lidocaine for local anesthesia. Four separate 25 gauge fine needle biopsy were then acquired of the left thyroid nodule using ultrasound guidance. Images were stored.  Slide preparation was performed.  Final image was stored after biopsy.  Patient tolerated the procedure well and remained hemodynamically stable throughout.  No complications were encountered and no significant blood loss was encounter  FINDINGS: Ultrasound survey of the left thyroid demonstrates heterogeneous nodule inferiorly.  Images during the case demonstrate needle tip within the nodule on each needle pass.  Final ultrasound image demonstrates no complicating features.  IMPRESSION: Status post ultrasound-guided biopsy of left inferior thyroid nodule. Specimen sent to pathology for complete histopathologic analysis.  Signed,  Dulcy Fanny. Earleen Newport, DO  [facility]  [facility]   Electronically Signed   By: Corrie Mckusick D.O.   On: 03/10/2015 14:44   Dg Chest Port 1 View  03/14/2015   CLINICAL DATA:  Abnormal labs, suspicious for infection.  EXAM: PORTABLE CHEST - 1 VIEW  COMPARISON:  03/12/2015  FINDINGS: There is persistent right pleural fluid or thickening in the lateral base. Mild linear basilar opacities persist and may represent scarring. Lungs are otherwise clear. Hilar, mediastinal and cardiac contours are unremarkable and unchanged. Pulmonary vasculature is normal.  IMPRESSION: Unchanged,  would minimal linear basilar opacities and right lateral pleural fluid/thickening.   Electronically Signed   By: Andreas Newport M.D.   On: 03/14/2015 03:09   Dg Knee Complete 4 Views Right  03/05/2015   CLINICAL DATA:  Acute onset of right knee pain and swelling. Initial encounter.  EXAM: RIGHT KNEE - COMPLETE 4+ VIEW  COMPARISON:  None.  FINDINGS: There is no evidence of  fracture or dislocation. The joint spaces are preserved. No significant degenerative change is seen; the patellofemoral joint is grossly unremarkable in appearance.  A small knee joint effusion is noted. The visualized soft tissues are otherwise unremarkable.  IMPRESSION: 1. No evidence of fracture or dislocation. 2. Small knee joint effusion noted.   Electronically Signed   By: Garald Balding M.D.   On: 03/05/2015 22:31   Dg Hand Complete Right  03/05/2015   CLINICAL DATA:  Bilateral hand pain and swelling. Initial encounter.  EXAM: RIGHT HAND - COMPLETE 3+ VIEW  COMPARISON:  None.  FINDINGS: There is no evidence of fracture or dislocation. The joint spaces are preserved. The carpal rows are intact, and demonstrate normal alignment. Mild dorsal soft tissue swelling is noted about the wrist.  IMPRESSION: No evidence of fracture or dislocation.   Electronically Signed   By: Garald Balding M.D.   On: 03/05/2015 22:30   US Thoracentesis Asp Pleural Space W/img Guide  03/03/2015   INDICATION: Dyspnea, bilateral pleural effusions; request is made for diagnostic and therapeutic right thoracentesis.  EXAM: ULTRASOUND GUIDED DIAGNOSTIC AND THERAPEUTIC RIGHT THORACENTESIS  COMPARISON:  None.  MEDICATIONS: None  COMPLICATIONS: None immediate  TECHNIQUE: Informed written consent was obtained from the patient via interpreter after a discussion of the risks, benefits and alternatives to treatment. A timeout was performed prior to the initiation of the procedure.  Initial ultrasound scanning demonstrates a pleural effusion. The lower chest was prepped and draped in the usual sterile fashion. 1% lidocaine was used for local anesthesia.  An ultrasound image was saved for documentation purposes. A 6 Fr Safe-T-Centesis catheter was introduced. The thoracentesis was performed. The catheter was removed and a dressing was applied. The patient tolerated the procedure well without immediate post procedural complication. The patient  was escorted to have an upright chest radiograph.  FINDINGS: A total of approximately 360 cc's of slightly turbid ,yellow fluid was removed. Requested samples were sent to the laboratory.  IMPRESSION: Successful ultrasound-guided diagnostic and therapeutic right sided thoracentesis yielding 360 cc's of pleural fluid.  Read by: Rowe Robert, PA-C   Electronically Signed   By: Markus Daft M.D.   On: 03/03/2015 16:41         Subjective: Patient complains of bilateral hand swelling and lower extremity edema. Also has right knee pain. Denies any chest pain, shortness breath, hemoptysis, nausea, vomiting, diarrhea, abd pain, dysuria, hematuria.  Objective: Filed Vitals:   03/14/15 0345 03/14/15 0400 03/14/15 0415 03/14/15 0517  BP: 103/59 104/64  94/51  Pulse:  97 89 94  Temp:    99.7 F (37.6 C)  TempSrc:    Oral  Resp: 22 18 18 18   Height:      Weight:    56.1 kg (123 lb 10.9 oz)  SpO2:  100% 99% 100%    Intake/Output Summary (Last 24 hours) at 03/14/15 0906 Last data filed at 03/14/15 0448  Gross per 24 hour  Intake   3000 ml  Output      0 ml  Net   3000 ml  Weight change:  Exam:   General:  Pt is alert, follows commands appropriately, not in acute distress  HEENT: No icterus, No thrush, [place]/AT, no meningismus  Cardiovascular: RRR, S1/S2, no rubs, no gallops  Respiratory: Bibasilar crackles. No wheeze.  Abdomen: Soft/+BS, non tender, non distended, no guarding Extremities: Bilateral lower extremity and hand swelling. Right knee effusion. Data Reviewed: Basic Metabolic Panel:  Recent Labs Lab 03/12/15 1325 03/14/15 0239  NA 136 137  K 4.2 4.7  CL 101 105  CO2 21 21  GLUCOSE 101* 98  BUN 7 8  CREATININE 0.53 0.56  CALCIUM 8.9 8.8   Liver Function Tests:  Recent Labs Lab 03/12/15 1325 03/14/15 0239  AST 69* 61*  ALT 65* 58*  ALKPHOS 93 114  BILITOT 0.4 1.2  PROT 6.8 7.2  ALBUMIN 3.0* 2.5*   No results for input(s): LIPASE, AMYLASE in the last 168  hours. No results for input(s): AMMONIA in the last 168 hours. CBC:  Recent Labs Lab 03/12/15 1325 03/14/15 0239  WBC 4.4 4.3  NEUTROABS 3.1 2.6  HGB 10.5* 10.2*  HCT 32.0* 31.8*  MCV 83.6 83.7  PLT 624* 522*   Cardiac Enzymes: No results for input(s): CKTOTAL, CKMB, CKMBINDEX, TROPONINI in the last 168 hours. BNP: Invalid input(s): POCBNP CBG: No results for input(s): GLUCAP in the last 168 hours.  Recent Results (from the past 240 hour(s))  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 tuberculosis, Direct CANCELED      Comment: Specimen already disgarded from referring Accession. Will need to recollect patient.  Result canceled by the ancillary    Source (MTBPCR) CANCELED      Comment: Result canceled by the ancillary  Culture, blood (single)     Status: None (Preliminary result)   Collection Time: 03/12/15  1:25 PM  Result Value Ref Range Status   Preliminary Report GRAM POSITIVE COCCI IN CLUSTERS  Preliminary  Culture, blood (single)     Status: None (Preliminary result)   Collection Time: 03/12/15  1:25 PM  Result Value Ref Range Status   Preliminary Report GRAM POSITIVE COCCI IN CLUSTERS  Preliminary     Scheduled Meds: . heparin  5,000 Units Subcutaneous 3 times per day  . vancomycin  750 mg Intravenous Q12H   Continuous Infusions: . sodium chloride       Lorisa Scheid, DO  [facility] Pager [phone]  If 7PM-7AM, please contact night-coverage [email or web address] Password [email or web address] 03/14/2015, 9:06 AM   LOS: 0 days

## 2015-03-14 NOTE — Progress Notes (Signed)
ANTIBIOTIC CONSULT NOTE - INITIAL  Pharmacy Consult for vancomycin and zosyn Indication: rule out sepsis  No Known Allergies  Patient Measurements: Height: 5\' 2"  (157.5 cm) Weight: 118 lb (53.524 kg) IBW/kg (Calculated) : 50.1 Adjusted Body Weight:   Vital Signs: Temp: 98.6 F (37 C) (04/02 0053) Temp Source: Oral (04/02 0053) BP: 108/72 mmHg (04/02 0053) Pulse Rate: 97 (04/02 0053) Intake/Output from previous day:   Intake/Output from this shift:    Labs:  Recent Labs  03/12/15 1325  WBC 4.4  HGB 10.5*  PLT 624*  CREATININE 0.53   Estimated Creatinine Clearance: 71.7 mL/min (by C-G formula based on Cr of 0.53). No results for input(s): VANCOTROUGH, VANCOPEAK, VANCORANDOM, GENTTROUGH, GENTPEAK, GENTRANDOM, TOBRATROUGH, TOBRAPEAK, TOBRARND, AMIKACINPEAK, AMIKACINTROU, AMIKACIN in the last 72 hours.   Microbiology: Recent Results (from the past 720 hour(s))  Body fluid culture     Status: None   Collection Time: 03/03/15  4:41 PM  Result Value Ref Range Status   Specimen Description PLEURAL  Final   Special Requests PLEURAL  Final   Gram Stain   Final    RARE WBC PRESENT, PREDOMINANTLY PMN NO ORGANISMS SEEN Performed at [facility]    Culture   Final    NO GROWTH 3 DAYS Performed at [facility]    Report Status 03/07/2015 FINAL  Final  AFB culture with smear     Status: None (Preliminary result)   Collection Time: 03/03/15  4:41 PM  Result Value Ref Range Status   Specimen Description PLEURAL  Final   Special Requests NONE  Final   Acid Fast Smear   Final    NO ACID FAST BACILLI SEEN Performed at [facility]    Culture   Final    CULTURE WILL BE EXAMINED FOR 6 WEEKS BEFORE ISSUING A FINAL REPORT Performed at [facility]    Report Status PENDING  Incomplete  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 tuberculosis, Direct CANCELED     Comment: Specimen already disgarded from referring Accession. Will need to recollect patient.  Result canceled by the ancillary    Source (MTBPCR) CANCELED      Comment: Result canceled by the ancillary  Culture, blood (single)     Status: None (Preliminary result)   Collection Time: 03/12/15  1:25 PM  Result Value Ref Range Status   Preliminary Report GRAM POSITIVE COCCI IN CLUSTERS  Preliminary  Culture, blood (single)     Status: None (Preliminary result)   Collection Time: 03/12/15  1:25 PM  Result Value Ref Range Status   Preliminary Report GRAM POSITIVE COCCI IN CLUSTERS  Preliminary    Medical History: Past Medical History  Diagnosis Date  . Anemia   . Goiter 02/07/2012    Medications:   (Not in a hospital admission) Scheduled:   Infusions:  . sodium chloride     Assessment: 44yo female presents with cough, DOE, arthralgias, and night sweats. Pharmacy is consulted to dose vancomycin and zosyn for rule out sepsis. Pt is afebrile, WBC wnl, sCr 0.53.  Goal of Therapy:  Vancomycin trough level 15-20 mcg/ml  Plan:  Vancomycin 750mg  IV q12h Zosyn 3.375g IV q8h Measure antibiotic drug levels at steady state Follow up culture results, renal function, and clinical course  Andrey Cota. Diona Foley, PharmD Clinical Pharmacist Pager [phone] 03/14/2015,2:11 AM

## 2015-03-14 NOTE — Consult Note (Signed)
[facility]    Date of Admission:  03/14/2015            Day 1 vancomycin                     Day 1 piperacillin tazobactam       Reason for Consult: Staph bacteremia    Referring Physician:  Dr. Berle Mull Primary Care Physician:  Dr. Reginia Forts  Principal Problem:   Gram-positive bacteremia Active Problems:   Latent tuberculosis by blood test   Goiter   Bilateral pleural effusion   Arthralgia   Amenorrhea   LAD (lymphadenopathy), axillary   Bilateral edema of lower extremity   Other specified fever   Sepsis   Transaminasemia   . heparin  5,000 Units Subcutaneous 3 times per day  . piperacillin-tazobactam (ZOSYN)  IV  3.375 g Intravenous 3 times per day  . vancomycin  750 mg Intravenous Q12H    Recommendations: 1. Continue vancomycin  2. Change piperacillin tazobactam to cefazolin pending final blood culture results 3. Await repeat blood culture and transthoracic echocardiogram results  Assessment:  It appears that her recent febrile illness is due to staph bacteremia although the migratory joint symptoms and negative pleural fluid cultures are little unusual for 3 weeks of staph bacteremia. Although her QuantiFERON gold TB assay is positive I do not think she has active tuberculosis. I would hold off on bronchoscopy and would not start any treatment for TB at this time. I will continue vancomycin for optimal coverage of methicillin-resistant staph and change piperacillin tazobactam to cefazolin for optimal coverage of methicillin sensitive staph pending final blood culture results. I'm not sure that she has enough effusion to tap in her knees but might be in her wrist. I will follow-up tomorrow.   HPI: Nichole Perry is a 44 y.o. female  who began having fevers, chills and sweats a little over 3 weeks ago. She is also noted migratory joint swelling and pain. He first began in both feet and both knees. The swelling and pain in her  feet has improved but now she has swelling in her left wrist. She is also had shortness of breath and was found to have bilateral pleural effusions on chest x-ray and CT scan. There was some atelectasis in the bases but no underlying infiltrates were noted. She underwent thoracentesis revealing exudative effusions. Gram stain and AFB stain were negative. Routine cultures were negative and AFB cultures are negative to date. Blood cultures were obtained on 03/12/2015 in both are growing gram-positive cocci in clusters leading to her admission. Her Quantiferon TB assay was also positive.   Review of Systems: Constitutional: positive for anorexia, chills, fatigue, fevers, sweats and weight loss Eyes: negative Ears, nose, mouth, throat, and face: negative Respiratory: positive for cough and shortness of breath, negative for pleurisy/chest pain and sputum Cardiovascular: negative Gastrointestinal: negative Genitourinary:negative Integument/breast: negative for rash  Past Medical History  Diagnosis Date  . Anemia   . Goiter 02/07/2012    History  Substance Use Topics  . Smoking status: Never Smoker   . Smokeless tobacco: Never Used  . Alcohol Use: No    Family History  Problem Relation Age of Onset  . Hypertension Mother   . Diabetes Father    No Known Allergies  OBJECTIVE: Blood pressure 120/69, pulse 100, temperature 99.5 F (37.5 C), temperature source Oral, resp. rate 16, height 5\' 2"  (1.575  m), weight 123 lb 10.9 oz (56.1 kg), last menstrual period 11/19/2014, SpO2 100 %. General:  She is alert and appears comfortable and in no distress Skin:  No rash. No splinter hemorrhages noted but the tips of her fingernails are painted Lymph nodes: No palpable adenopathy Neck: Supple Oral: No oropharyngeal lesions Eyes: Normal external exam. No conjunctival hemorrhages noted Lungs:  Diminished breath sounds in bases posteriorly Cor:  Regular S1 and S2 with no murmurs Abdomen:  Soft and  nontender with no palpable masses Extremities: Diffuse swelling of her left wrist. It is not particularly warm, red or painful. Both knees are warm to touch but no definite effusions noted. Minimal pain with flexion.  Lab Results Lab Results  Component Value Date   WBC 4.3 03/14/2015   HGB 10.2* 03/14/2015   HCT 31.8* 03/14/2015   MCV 83.7 03/14/2015   PLT 522* 03/14/2015    Lab Results  Component Value Date   CREATININE 0.56 03/14/2015   BUN 8 03/14/2015   NA 137 03/14/2015   K 4.7 03/14/2015   CL 105 03/14/2015   CO2 21 03/14/2015    Lab Results  Component Value Date   ALT 58* 03/14/2015   AST 61* 03/14/2015   ALKPHOS 114 03/14/2015   BILITOT 1.2 03/14/2015     Microbiology: Recent Results (from the past 240 hour(s))  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 tuberculosis, Direct CANCELED      Comment: Specimen already disgarded from referring Accession. Will need to recollect patient.  Result canceled by the ancillary    Source (MTBPCR) CANCELED      Comment: Result canceled by the ancillary  Culture, blood (single)     Status: None (Preliminary result)   Collection Time: 03/12/15  1:25 PM  Result Value Ref Range Status   Preliminary Report GRAM POSITIVE COCCI IN CLUSTERS  Preliminary  Culture, blood (single)     Status: None (Preliminary result)   Collection Time: 03/12/15  1:25 PM  Result Value Ref Range Status   Preliminary Report GRAM POSITIVE COCCI IN CLUSTERS  Preliminary    Michel Bickers, [place] [phone] pager   [phone] cell 03/14/2015, 2:13 PM

## 2015-03-15 DIAGNOSIS — R740 Nonspecific elevation of levels of transaminase and lactic acid dehydrogenase [LDH]: Secondary | ICD-10-CM

## 2015-03-15 DIAGNOSIS — B958 Unspecified staphylococcus as the cause of diseases classified elsewhere: Secondary | ICD-10-CM

## 2015-03-15 DIAGNOSIS — B377 Candidal sepsis: Secondary | ICD-10-CM

## 2015-03-15 DIAGNOSIS — R7881 Bacteremia: Secondary | ICD-10-CM

## 2015-03-15 DIAGNOSIS — M7989 Other specified soft tissue disorders: Secondary | ICD-10-CM

## 2015-03-15 DIAGNOSIS — R799 Abnormal finding of blood chemistry, unspecified: Secondary | ICD-10-CM

## 2015-03-15 LAB — CBC WITH DIFFERENTIAL/PLATELET
BASOS ABS: 0 10*3/uL (ref 0.0–0.1)
BASOS PCT: 1 % (ref 0–1)
EOS ABS: 0.1 10*3/uL (ref 0.0–0.7)
Eosinophils Relative: 3 % (ref 0–5)
HCT: 24.9 % — ABNORMAL LOW (ref 36.0–46.0)
HEMOGLOBIN: 8.3 g/dL — AB (ref 12.0–15.0)
Lymphocytes Relative: 27 % (ref 12–46)
Lymphs Abs: 1 10*3/uL (ref 0.7–4.0)
MCH: 27.5 pg (ref 26.0–34.0)
MCHC: 33.3 g/dL (ref 30.0–36.0)
MCV: 82.5 fL (ref 78.0–100.0)
Monocytes Absolute: 0.3 10*3/uL (ref 0.1–1.0)
Monocytes Relative: 9 % (ref 3–12)
NEUTROS PCT: 60 % (ref 43–77)
Neutro Abs: 2.3 10*3/uL (ref 1.7–7.7)
Platelets: 434 10*3/uL — ABNORMAL HIGH (ref 150–400)
RBC: 3.02 MIL/uL — AB (ref 3.87–5.11)
RDW: 13.2 % (ref 11.5–15.5)
WBC: 3.8 10*3/uL — AB (ref 4.0–10.5)

## 2015-03-15 LAB — COMPREHENSIVE METABOLIC PANEL
ALBUMIN: 1.9 g/dL — AB (ref 3.5–5.2)
ALK PHOS: 71 U/L (ref 39–117)
ALT: 28 U/L (ref 0–35)
AST: 41 U/L — AB (ref 0–37)
Anion gap: 7 (ref 5–15)
CO2: 20 mmol/L (ref 19–32)
Calcium: 8.1 mg/dL — ABNORMAL LOW (ref 8.4–10.5)
Chloride: 107 mmol/L (ref 96–112)
Creatinine, Ser: 0.66 mg/dL (ref 0.50–1.10)
GFR calc Af Amer: >90 mL/min (ref >90)
Glucose, Bld: 166 mg/dL — ABNORMAL HIGH (ref 70–99)
POTASSIUM: 3.8 mmol/L (ref 3.5–5.1)
SODIUM: 134 mmol/L — AB (ref 135–145)
Total Bilirubin: 0.5 mg/dL (ref 0.3–1.2)
Total Protein: 5.8 g/dL — ABNORMAL LOW (ref 6.0–8.3)

## 2015-03-15 LAB — HEPATITIS C ANTIBODY: HCV AB: NEGATIVE

## 2015-03-15 LAB — HEPATITIS B SURFACE ANTIGEN: Hepatitis B Surface Ag: NEGATIVE

## 2015-03-15 NOTE — Progress Notes (Signed)
Patient ID: Nichole Perry, female   DOB: 04/06/1971, 44 y.o.   MRN: [id number]         [facility]    Date of Admission:  03/14/2015    Total days of antibiotics 2          Principal Problem:   Coagulase negative Staphylococcus bacteremia Active Problems:   Bilateral pleural effusion   Arthralgia   Fever   Latent tuberculosis by blood test   Goiter   Amenorrhea   LAD (lymphadenopathy), axillary   Bilateral edema of lower extremity   Sepsis   Transaminasemia   .  ceFAZolin (ANCEF) IV  2 g Intravenous 3 times per day  . heparin  5,000 Units Subcutaneous 3 times per day  . vancomycin  750 mg Intravenous Q12H    Subjective: She says that she feels better today. She is not having any pain in her knees when she walks. She denies pain in her left wrist. She notes some shortness of breath when she takes a deep breath.  Review of Systems: Pertinent items are noted in HPI.  Past Medical History  Diagnosis Date  . Anemia   . Goiter 02/07/2012    History  Substance Use Topics  . Smoking status: Never Smoker   . Smokeless tobacco: Never Used  . Alcohol Use: No    Family History  Problem Relation Age of Onset  . Hypertension Mother   . Diabetes Father    No Known Allergies  OBJECTIVE: Blood pressure 100/58, pulse 97, temperature 99.9 F (37.7 C), temperature source Oral, resp. rate 16, height 5\' 2"  (1.575 m), weight 122 lb 9.2 oz (55.6 kg), last menstrual period 11/19/2014, SpO2 97 %. General: Comfortable and in no distress Skin: No rash Lungs: Clear anteriorly. Diminished breath sounds in bases posteriorly Cor: Regular S1 and S2 with no murmur Abdomen: Soft and nontender with no palpable masses Joints and extremities: No change in left wrist swelling without redness, warmth or pain. Decreased warmth in her knees.  Lab Results Lab Results  Component Value Date   WBC 4.3 03/14/2015   HGB 10.2* 03/14/2015   HCT 31.8* 03/14/2015   MCV  83.7 03/14/2015   PLT 522* 03/14/2015    Lab Results  Component Value Date   CREATININE 0.66 03/15/2015   BUN <5* 03/15/2015   NA 134* 03/15/2015   K 3.8 03/15/2015   CL 107 03/15/2015   CO2 20 03/15/2015    Lab Results  Component Value Date   ALT 28 03/15/2015   AST 41* 03/15/2015   ALKPHOS 71 03/15/2015   BILITOT 0.5 03/15/2015     Microbiology: Recent Results (from the past 240 hour(s))  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 tuberculosis, Direct CANCELED      Comment: Specimen already disgarded from referring Accession. Will need to recollect patient.  Result canceled by the ancillary    Source (MTBPCR) CANCELED      Comment: Result canceled by the ancillary  Culture, blood (single)     Status: None (Preliminary result)   Collection Time: 03/12/15  1:25 PM  Result Value Ref Range Status   Preliminary Report STAPHYLOCOCCUS SPECIES (COAGULASE NEGATIVE)  Preliminary    Comment: Rifampin and Gentamicin should not be used as single drugs for treatment of Staph infections. Gram Stain Report Called to,Read Back By and Verified With:  DR. Reginia Forts 03/13/15 AT 2300 [address]   Culture, blood (single)     Status: None (Preliminary result)   Collection Time: 03/12/15  1:25 PM  Result Value Ref Range Status   Preliminary Report STAPHYLOCOCCUS SPECIES (COAGULASE NEGATIVE)  Preliminary  Blood Culture (routine x 2)     Status: None (Preliminary result)   Collection Time: 03/14/15  2:39 AM  Result Value Ref Range Status   Specimen Description BLOOD RIGHT WRIST  Final   Special Requests BOTTLES DRAWN AEROBIC ONLY 4CC  Final   Culture   Final           BLOOD CULTURE RECEIVED NO GROWTH TO DATE CULTURE WILL BE HELD FOR 5 DAYS BEFORE ISSUING A FINAL NEGATIVE REPORT Performed at [facility]    Report Status PENDING  Incomplete  Blood Culture (routine x 2)     Status: None (Preliminary  result)   Collection Time: 03/14/15  2:46 AM  Result Value Ref Range Status   Specimen Description BLOOD LEFT ARM  Final   Special Requests BOTTLES DRAWN AEROBIC AND ANAEROBIC 5CC  Final   Culture   Final           BLOOD CULTURE RECEIVED NO GROWTH TO DATE CULTURE WILL BE HELD FOR 5 DAYS BEFORE ISSUING A FINAL NEGATIVE REPORT Performed at [facility]    Report Status PENDING  Incomplete    Assessment: I am not entirely certain what has caused her recent illness. Although both blood cultures from 03/12/2015 have grown coagulase-negative staph repeat blood cultures on 03/14/2015 before antibiotics were started remain negative and I am concerned that the positive blood cultures may reflect contaminants. Her pleural fluid culture was also negative. She does seem to be a little better today so I will continue antibiotics pending the results of all blood cultures. With the possibility that the positive blood cultures are contaminants, tuberculosis is still a possibility so I will continue airborne precautions.  Plan: 1. Continue current antibiotics pending final blood culture results  Michel Bickers, MD [facility] for [place] [phone] pager   [phone] cell 03/15/2015, 1:54 PM

## 2015-03-15 NOTE — Progress Notes (Signed)
PROGRESS NOTE  Nichole Perry ZOX:[id number] DOB: 13-Jun-1971 DOA: 03/14/2015 PCP: Maximino Greenland, MD  Brief history 44 year old female with a history of goiter and anemia presented to the hospital when she was called by her primary care provider with positive blood cultures. The patient has not been feeling well for nearly 3 weeks. The patient initially presented to her primary care provider because of bilateral knee pain and leg swelling on 02/21/2015. She was started on furosemide 20 mg daily. Chest x-ray at that time revealed small to moderate bilateral pleural effusions. CT angio of the chest was performed on 02/21/2015 which was negative for pulmonary embolus but revealed bilateral pleural effusions and central bronchial wall thickening and enlarged right axillary lymph node. The patient followed up on 03/07/2015. She underwent thoracocentesis on 03/03/2015. Fluid chemistries revealed an exudative pleural effusion with WBC 3000 (64% monos, 28% PMN, 8% eosinophils). Routine Fluid culture and AFB culture are negative at this time. 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 The patient represented to the primary care provider office on 03/12/2015 with 2 day history of fever up to 101.65F. Blood cultures were obtained at that time. Notably, the patient also was noted to have a right thyroid nodule which has been biopsied on 03/10/2015. The biopsy revealed benign follicular nodule. Assessment/Plan: Sepsis -Present at the time of admission -Secondary to bacteremia? -Lactic acid 2.22 -Fluid resuscitation -Continue IV vancomycin -flu is neg Bacteremia--CoNS -unclear presently if true bacteremia vs contaminant as 4/2 repeat blood cultures remain neg (had not yet started abx) -however, pt appears to be doing clinically better on abx and has defervesced -ID following -echo--no gross vegetatino -follow surveillance blood cultures QuantiFeron Positive -given pt's clinical and epidemiologic hx, will  need to r/o pulmonary TB -airborne isolation -appreciate pulmonary input -if clinical picture remains unclear as to source of fever, may need bronch/BAL vs VATS (pleural bx) -agree that pleural fluid usually low yield for AFB Transaminasemia -secondary to sepsis -trending down -No abdominal pain, no vomiting -Hepatitis B surface antigen, hepatitis C antibody--pending -HIV antibody is negative Lower extremity edema and pain -Venous duplex r/o DVT--neg Bilateral pleural effusion -03/03/2015 thoracocentesis--exudative - WBC 3000 (64% monos, 28% PMN, 8% eosinophils) Arthralgias and myalgias -Autoimmune workup has been negative including C3, C4, anti-CCP, ANA, RF, ENA, -check anti-smith and anti-dsDNA  -ask IR to possibly aspirate R-knee--hopefully 4/4 -cell count more important than culture if not enough fluid to perform both    Family Communication: family updated at beside Disposition Plan: Home when medically stable    Procedures/Studies: Dg Chest 1 View  03/03/2015   CLINICAL DATA:  Post right thoracentesis  EXAM: CHEST  1 VIEW  COMPARISON:  Chest x-ray of 02/21/2015  FINDINGS: Some of the right pleural effusion has been evacuated. No obvious pneumothorax is seen, but on the frontal view there is a fluid level at the right lung base and a small right hydro pneumothorax is suspected. Opacity at the left lung base persists consistent with left effusion, atelectasis, and possibly pneumonia. Heart size is stable.  IMPRESSION: 1. Suspect small right hydro pneumothorax after right thoracentesis. 2. Reduction of right pleural effusion.   Electronically Signed   By: Ivar Drape M.D.   On: 03/03/2015 16:17   Dg Chest 2 View  03/13/2015   CLINICAL DATA:  Cough  EXAM: CHEST  2 VIEW  COMPARISON:  March 03, 2015 chest radiograph and chest CT February 21, 2015  FINDINGS: There is a small pleural effusion on  the right. No pneumothorax appreciable. There is mild bibasilar atelectasis. Lungs are  otherwise clear. Heart size and pulmonary vascularity are normal. No adenopathy. There is evidence of sclerosis in several ribs consistent with prior rib trauma. No acute fracture apparent. Nipple shadows are noted bilaterally.  IMPRESSION: Small right pleural effusion. Mild bibasilar atelectasis. No airspace consolidation or edema.   Electronically Signed   By: Lowella Grip III M.D.   On: 03/13/2015 09:20   Dg Chest 2 View  02/21/2015   CLINICAL DATA:  Chest pain and shortness of breath. Negative pregnancy test.  EXAM: CHEST  2 VIEW  COMPARISON:  12/05/2014  FINDINGS: Lungs are adequately inflated and demonstrate small to moderate bilateral pleural effusions right greater than left which are new. Likely associated atelectasis in the lung bases as basilar infection is also possible person cardiomediastinal silhouette and remainder of the exam is unchanged.  IMPRESSION: Small to moderate bilateral pleural effusions right greater than left. Likely associated bibasilar atelectasis versus infection.   Electronically Signed   By: Marin Olp M.D.   On: 02/21/2015 14:54   Ct Angio Chest W/cm &/or Wo Cm  02/21/2015   CLINICAL DATA:  3 day history of bilateral lower extremity edema, cough, and shortness of breath with intermittent stabbing chest pain radiating to the back.  EXAM: CT ANGIOGRAPHY CHEST WITH CONTRAST  TECHNIQUE: Multidetector CT imaging of the chest was performed using the standard protocol during bolus administration of intravenous contrast. Multiplanar CT image reconstructions and MIPs were obtained to evaluate the vascular anatomy.  CONTRAST:  175mL OMNIPAQUE IOHEXOL 350 MG/ML IV.  COMPARISON:  No prior CT. Two-view chest x-ray earlier same date and 12/05/2014.  FINDINGS: Contrast opacification of the pulmonary arteries is good respiratory motion blurred many of the images of the lung bases. Overall, the study is of good diagnostic quality.  No filling defects within either main pulmonary  artery or their branches in either lung to suggest pulmonary embolism. Heart size upper normal. Small pericardial effusion. No visible atherosclerosis involving the thoracic or upper abdominal aorta or their visualized branches. No visible coronary artery atherosclerosis.  Large bilateral pleural effusions including of fluid in the major fissure on the left. Associated dense passive atelectasis in the lower lobes and right middle lobe. No confluent airspace consolidation otherwise. No pulmonary parenchymal nodules or masses. Central airways patent with marked bronchial wall thickening.  Enlarged level 1 and level 2 right axillary lymph nodes, the largest measuring approximately 2.5 x 1.4 cm. No significant mediastinal, hilar or left axillary lymphadenopathy. Mass involving the lower pole of the left lobe of the thyroid gland measuring approximately 4.0 x 3.1 x 4.2 cm.  Visualized upper abdomen unremarkable for the early arterial phase of enhancement. Focus of accessory splenic tissue posterior to the mid spleen. Bone window images demonstrate a likely benign sclerotic bone island in the T6 vertebral body.  Review of the MIP images confirms the above findings.  IMPRESSION: 1. No evidence of pulmonary embolism. 2. Large bilateral pleural effusions including fluid in the major fissure on the left with associated passive atelectasis in the lower lobes and right middle lobe. 3. Marked central bronchial wall thickening consistent with severe bronchitis and/or asthma. 4. Small pericardial effusion. 5. Enlarged right axillary lymph nodes without evidence of lymphadenopathy elsewhere. Isolated unilateral axillary lymphadenopathy can be seen in breast cancer, so if not recently performed, diagnostic mammography may be helpful in further evaluation. 6. Approximate 4 cm mass involving the lower pole of the left  lobe of the thyroid gland. Based on size, evaluation with ultrasound and likely biopsy is indicated. This follows ACR  consensus guidelines: Managing Incidental Thyroid Nodules Detected on Imaging: White Paper of the ACR Incidental Thyroid Findings Committee. J Am Coll Radiol 2015; 12:143-150.   Electronically Signed   By: Evangeline Dakin M.D.   On: 02/21/2015 19:06   US Soft Tissue Head/neck  02/25/2015   CLINICAL DATA:  Thyroid nodule.  EXAM: THYROID ULTRASOUND  TECHNIQUE: Ultrasound examination of the thyroid gland and adjacent soft tissues was performed.  COMPARISON:  Chest CT 02/21/2015  FINDINGS: Right thyroid lobe  Measurements: 5.8 x 1.8 x 1.9 cm. There is a subtle heterogeneous nodule along the medial right thyroid lobe that measures 0.6 x 0.3 x 0.7 cm. There is a heterogeneous nodule in the inferior right thyroid lobe that measures 1.0 x 0.5 x 0.8 cm.  Left thyroid lobe  Measurements: 7.1 x 2.7 x 2.6 cm. There is a dominant nodule occupying the mid and lower aspect of the left thyroid lobe. This lesion corresponds with the nodule seen on the recent CT. This dominant left thyroid nodule measures 4.7 x 2.5 x 4.1 cm. Nodule is heterogeneous with small cystic areas. Cannot exclude small calcifications in this nodule.  Isthmus  Thickness: 0.4 cm.  No nodules visualized.  Lymphadenopathy  None visualized.  IMPRESSION: Bilateral thyroid nodules. There is a dominant left thyroid nodule that measures up to 4.7 cm. Findings meet consensus criteria for biopsy. Ultrasound-guided fine needle aspiration should be considered, as per the consensus statement: Management of Thyroid Nodules Detected at [place]: Society of Radiologists in [place]. Radiology 2005; [id number].   Electronically Signed   By: Markus Daft M.D.   On: 02/25/2015 16:21   US Thyroid Biopsy  03/10/2015   CLINICAL DATA:  44 year old female with a history of left-sided thyroid nodule. She has been referred for percutaneous biopsy.  EXAM: ULTRASOUND GUIDED NEEDLE ASPIRATE BIOPSY OF THE THYROID GLAND  COMPARISON:  02/25/2015  PROCEDURE:  The procedure, risks, benefits, and alternatives were explained to the patient. Questions regarding the procedure were encouraged and answered. The patient understands and consents to the procedure.  Ultrasound survey was performed with images stored and sent to PACs.  The left neck was prepped with Betadine in a sterile fashion, and a sterile drape was applied covering the operative field. A sterile gown and sterile gloves were used for the procedure. Local anesthesia was provided with 1% Lidocaine.  Ultrasound guidance was used to infiltrate the region with 1% lidocaine for local anesthesia. Four separate 25 gauge fine needle biopsy were then acquired of the left thyroid nodule using ultrasound guidance. Images were stored.  Slide preparation was performed.  Final image was stored after biopsy.  Patient tolerated the procedure well and remained hemodynamically stable throughout.  No complications were encountered and no significant blood loss was encounter  FINDINGS: Ultrasound survey of the left thyroid demonstrates heterogeneous nodule inferiorly.  Images during the case demonstrate needle tip within the nodule on each needle pass.  Final ultrasound image demonstrates no complicating features.  IMPRESSION: Status post ultrasound-guided biopsy of left inferior thyroid nodule. Specimen sent to pathology for complete histopathologic analysis.  Signed,  Dulcy Fanny. Earleen Newport, DO  [facility]  [facility]   Electronically Signed   By: Corrie Mckusick D.O.   On: 03/10/2015 14:44   Dg Chest Port 1 View  03/14/2015   CLINICAL DATA:  Abnormal labs, suspicious  for infection.  EXAM: PORTABLE CHEST - 1 VIEW  COMPARISON:  03/12/2015  FINDINGS: There is persistent right pleural fluid or thickening in the lateral base. Mild linear basilar opacities persist and may represent scarring. Lungs are otherwise clear. Hilar, mediastinal and cardiac contours are unremarkable and unchanged.  Pulmonary vasculature is normal.  IMPRESSION: Unchanged, would minimal linear basilar opacities and right lateral pleural fluid/thickening.   Electronically Signed   By: Andreas Newport M.D.   On: 03/14/2015 03:09   Dg Knee Complete 4 Views Right  03/05/2015   CLINICAL DATA:  Acute onset of right knee pain and swelling. Initial encounter.  EXAM: RIGHT KNEE - COMPLETE 4+ VIEW  COMPARISON:  None.  FINDINGS: There is no evidence of fracture or dislocation. The joint spaces are preserved. No significant degenerative change is seen; the patellofemoral joint is grossly unremarkable in appearance.  A small knee joint effusion is noted. The visualized soft tissues are otherwise unremarkable.  IMPRESSION: 1. No evidence of fracture or dislocation. 2. Small knee joint effusion noted.   Electronically Signed   By: Garald Balding M.D.   On: 03/05/2015 22:31   Dg Hand Complete Right  03/05/2015   CLINICAL DATA:  Bilateral hand pain and swelling. Initial encounter.  EXAM: RIGHT HAND - COMPLETE 3+ VIEW  COMPARISON:  None.  FINDINGS: There is no evidence of fracture or dislocation. The joint spaces are preserved. The carpal rows are intact, and demonstrate normal alignment. Mild dorsal soft tissue swelling is noted about the wrist.  IMPRESSION: No evidence of fracture or dislocation.   Electronically Signed   By: Garald Balding M.D.   On: 03/05/2015 22:30   US Thoracentesis Asp Pleural Space W/img Guide  03/03/2015   INDICATION: Dyspnea, bilateral pleural effusions; request is made for diagnostic and therapeutic right thoracentesis.  EXAM: ULTRASOUND GUIDED DIAGNOSTIC AND THERAPEUTIC RIGHT THORACENTESIS  COMPARISON:  None.  MEDICATIONS: None  COMPLICATIONS: None immediate  TECHNIQUE: Informed written consent was obtained from the patient via interpreter after a discussion of the risks, benefits and alternatives to treatment. A timeout was performed prior to the initiation of the procedure.  Initial ultrasound scanning  demonstrates a pleural effusion. The lower chest was prepped and draped in the usual sterile fashion. 1% lidocaine was used for local anesthesia.  An ultrasound image was saved for documentation purposes. A 6 Fr Safe-T-Centesis catheter was introduced. The thoracentesis was performed. The catheter was removed and a dressing was applied. The patient tolerated the procedure well without immediate post procedural complication. The patient was escorted to have an upright chest radiograph.  FINDINGS: A total of approximately 360 cc's of slightly turbid ,yellow fluid was removed. Requested samples were sent to the laboratory.  IMPRESSION: Successful ultrasound-guided diagnostic and therapeutic right sided thoracentesis yielding 360 cc's of pleural fluid.  Read by: Rowe Robert, PA-C   Electronically Signed   By: Markus Daft M.D.   On: 03/03/2015 16:41        Subjective: Patient is feeling a little bit better. She is still having some dyspnea on exertion. She denies any headache, fevers, chills, chest pain, hemoptysis, vomiting, diarrhea, abdominal pain, dysuria, hematuria.  Objective: Filed Vitals:   03/14/15 0517 03/14/15 1400 03/14/15 1942 03/15/15 0643  BP: 94/51 120/69 129/74 100/58  Pulse: 94 100 99 97  Temp: 99.7 F (37.6 C) 99.5 F (37.5 C) 99.9 F (37.7 C) 99.9 F (37.7 C)  TempSrc: Oral Oral Oral Oral  Resp: 18 16 16 16   Height:  Weight: 56.1 kg (123 lb 10.9 oz)   55.6 kg (122 lb 9.2 oz)  SpO2: 100% 100% 99% 97%    Intake/Output Summary (Last 24 hours) at 03/15/15 1537 Last data filed at 03/15/15 0643  Gross per 24 hour  Intake    120 ml  Output      3 ml  Net    117 ml   Weight change: 2.075 kg (4 lb 9.2 oz) Exam:   General:  Pt is alert, follows commands appropriately, not in acute distress  HEENT: No icterus, No thrush, No meningismus, [place]/AT  Cardiovascular: RRR, S1/S2, no rubs, no gallops  Respiratory: Bibasilar crackles. No wheeze. Good air movement.  Abdomen:  Soft/+BS, non tender, non distended, no guarding  Extremities: 1+ edema upper and lower extremities, No lymphangitis, No petechiae, No rashes; +mild synovitis of R>L knee  Data Reviewed: Basic Metabolic Panel:  Recent Labs Lab 03/12/15 1325 03/14/15 0239 03/15/15 0940  NA 136 137 134*  K 4.2 4.7 3.8  CL 101 105 107  CO2 21 21 20   GLUCOSE 101* 98 166*  BUN 7 8 <5*  CREATININE 0.53 0.56 0.66  CALCIUM 8.9 8.8 8.1*   Liver Function Tests:  Recent Labs Lab 03/12/15 1325 03/14/15 0239 03/15/15 0940  AST 69* 61* 41*  ALT 65* 58* 28  ALKPHOS 93 114 71  BILITOT 0.4 1.2 0.5  PROT 6.8 7.2 5.8*  ALBUMIN 3.0* 2.5* 1.9*   No results for input(s): LIPASE, AMYLASE in the last 168 hours. No results for input(s): AMMONIA in the last 168 hours. CBC:  Recent Labs Lab 03/12/15 1325 03/14/15 0239 03/15/15 1430  WBC 4.4 4.3 3.8*  NEUTROABS 3.1 2.6 2.3  HGB 10.5* 10.2* 8.3*  HCT 32.0* 31.8* 24.9*  MCV 83.6 83.7 82.5  PLT 624* 522* 434*   Cardiac Enzymes: No results for input(s): CKTOTAL, CKMB, CKMBINDEX, TROPONINI in the last 168 hours. BNP: Invalid input(s): POCBNP CBG: No results for input(s): GLUCAP in the last 168 hours.  Recent Results (from the past 240 hour(s))  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 tuberculosis, Direct CANCELED      Comment: Specimen already disgarded from referring Accession. Will need to recollect patient.  Result canceled by the ancillary    Source (MTBPCR) CANCELED      Comment: Result canceled by the ancillary  Culture, blood (single)     Status: None (Preliminary result)   Collection Time: 03/12/15  1:25 PM  Result Value Ref Range Status   Preliminary Report STAPHYLOCOCCUS SPECIES (COAGULASE NEGATIVE)  Preliminary    Comment: Rifampin and Gentamicin should not be used as single drugs for treatment of Staph infections. Gram Stain Report Called to,Read  Back By and Verified With: DR. Reginia Forts 03/13/15 AT 23[address] [place]   Culture, blood (single)     Status: None (Preliminary result)   Collection Time: 03/12/15  1:25 PM  Result Value Ref Range Status   Preliminary Report STAPHYLOCOCCUS SPECIES (COAGULASE NEGATIVE)  Preliminary  Blood Culture (routine x 2)     Status: None (Preliminary result)   Collection Time: 03/14/15  2:39 AM  Result Value Ref Range Status   Specimen Description BLOOD RIGHT WRIST  Final   Special Requests BOTTLES DRAWN AEROBIC ONLY 4CC  Final   Culture   Final           BLOOD CULTURE RECEIVED  NO GROWTH TO DATE CULTURE WILL BE HELD FOR 5 DAYS BEFORE ISSUING A FINAL NEGATIVE REPORT Performed at [facility]    Report Status PENDING  Incomplete  Blood Culture (routine x 2)     Status: None (Preliminary result)   Collection Time: 03/14/15  2:46 AM  Result Value Ref Range Status   Specimen Description BLOOD LEFT ARM  Final   Special Requests BOTTLES DRAWN AEROBIC AND ANAEROBIC 5CC  Final   Culture   Final           BLOOD CULTURE RECEIVED NO GROWTH TO DATE CULTURE WILL BE HELD FOR 5 DAYS BEFORE ISSUING A FINAL NEGATIVE REPORT Performed at [facility]    Report Status PENDING  Incomplete     Scheduled Meds: .  ceFAZolin (ANCEF) IV  2 g Intravenous 3 times per day  . heparin  5,000 Units Subcutaneous 3 times per day  . vancomycin  750 mg Intravenous Q12H   Continuous Infusions: . sodium chloride 125 mL/hr at 03/15/15 1039     Nichole Grudzinski, DO  [facility] Pager [phone]  If 7PM-7AM, please contact night-coverage [email or web address] Password [email or web address] 03/15/2015, 3:37 PM   LOS: 1 day

## 2015-03-15 NOTE — Progress Notes (Signed)
Echocardiogram 2D Echocardiogram has been performed.  Nichole Perry 03/15/2015, 11:19 AM

## 2015-03-15 NOTE — Consult Note (Signed)
Name: Nichole Perry MRN: [id number] DOB: 13-Apr-1971    ADMISSION DATE:  03/14/2015 CONSULTATION DATE:  03/15/15  REFERRING MD :  Tat  CHIEF COMPLAINT:  Knee pain  BRIEF PATIENT DESCRIPTION: 19 yoF never smoker from [place] presented 3 week hx knee pain and swelling, pleural effusions, R axillary lymph node, bronchial thickening. Goiter. Quantiferon Gold Positive. Exudative pleural effusion with fluid cx negative so far. Blood cultures + GPCs in clusters. PCCM asked to consider BAL to look for M.tb.   SIGNIFICANT EVENTS  CTa chest 3/12- bilat pleural effusions Thoracentesis 3/22- WBC 3000 (64% monos, 28% PMN, 8% EOS)   STUDIES:  Thoracentesis fluid cx neg so far for routine and AFB   HISTORY OF PRESENT ILLNESS:   From Dr Hale Bogus good summary: 44 year old female with a history of goiter and anemia presented to the hospital when she was called by her primary care provider with positive blood cultures. The patient has not been feeling well for nearly 3 weeks. The patient initially presented to her primary care provider because of bilateral knee pain and leg swelling on 02/21/2015. She was started on furosemide 20 mg daily. Chest x-ray at that time revealed small to moderate bilateral pleural effusions. CT angio of the chest was performed on 02/21/2015 which was negative for pulmonary embolus but revealed bilateral pleural effusions and central bronchial wall thickening and enlarged right axillary lymph node. The patient followed up on 03/07/2015. She underwent thoracocentesis on 03/03/2015. Fluid chemistries revealed an exudative pleural effusion with WBC 3000 (64% monos, 28% PMN, 8% eosinophils). Routine Fluid culture and AFB culture are negative at this time. 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 The patient represented to the primary care provider office on 03/12/2015 with 2 day history of fever up to 101.20F. Blood cultures were obtained at that time. Notably, the patient also was noted to have a  right thyroid nodule which has been biopsied on 03/10/2015. The biopsy revealed benign follicular nodule. Additionally- husband helps with translation. They deny known TB exposure, chronic cough or night sweats, or any prior significant lung infection.  PAST MEDICAL HISTORY :   has a past medical history of Anemia and Goiter (02/07/2012).  has past surgical history that includes Back surgery. Prior to Admission medications   Medication Sig Start Date End Date Taking? Authorizing Provider  acetaminophen (TYLENOL) 500 MG tablet Take 500 mg by mouth every 6 (six) hours as needed for fever.   Yes Historical Provider, MD  azithromycin (ZITHROMAX) 500 MG tablet Take 1,000 mg by mouth every 8 (eight) hours.   Yes Historical Provider, MD  furosemide (LASIX) 20 MG tablet Take 1 tablet (20 mg total) by mouth daily. 02/21/15  Yes Wardell Honour, MD  meloxicam (MOBIC) 15 MG tablet Take 1 tablet (15 mg total) by mouth daily. 03/12/15  Yes Wardell Honour, MD  VITAMIN D, CHOLECALCIFEROL, PO Take 1 capsule by mouth daily.   Yes Historical Provider, MD   No Known Allergies  FAMILY HISTORY:  family history includes Diabetes in her father; Hypertension in her mother. SOCIAL HISTORY:  reports that she has never smoked. She has never used smokeless tobacco. She reports that she does not drink alcohol or use illicit drugs.  REVIEW OF SYSTEMS:  + = positive Constitutional:  +fever, chills, weight loss, malaise/fatigue and diaphoresis.  HENT: Negative for hearing loss, ear pain, nosebleeds, congestion, sore throat, neck pain, tinnitus and ear discharge.   Eyes: Negative for blurred vision, double vision, photophobia, pain, discharge and redness.  Respiratory:  Negative for cough, hemoptysis, sputum production, shortness of breath, wheezing and stridor.   Cardiovascular: Negative for chest pain, palpitations, orthopnea, claudication, leg swelling and PND.  Gastrointestinal: Negative for heartburn, nausea, vomiting,  abdominal pain, diarrhea, constipation, blood in stool and melena.  Genitourinary: Negative for dysuria, urgency, frequency, hematuria and flank pain.  Musculoskeletal: Negative for myalgias, back pain,+ joint pain, falls.  Skin: Negative for itching and rash.  Neurological: Negative for dizziness, tingling, tremors, sensory change, speech change, focal weakness, seizures, loss of consciousness, weakness and headaches.  Endo/Heme/Allergies: Negative for environmental allergies and polydipsia. Does not bruise/bleed easily.  SUBJECTIVE:   VITAL SIGNS: Temp:  [99.5 F (37.5 C)-99.9 F (37.7 C)] 99.9 F (37.7 C) (04/03 0643) Pulse Rate:  [97-100] 97 (04/03 0643) Resp:  [16] 16 (04/03 0643) BP: (100-129)/(58-74) 100/58 mmHg (04/03 0643) SpO2:  [97 %-100 %] 97 % (04/03 0643) Weight:  [55.6 kg (122 lb 9.2 oz)] 55.6 kg (122 lb 9.2 oz) (04/03 0643)  PHYSICAL EXAMINATION: General:  Pleasant cooperative, looks well, NAD Neuro:  Grossly intact, non-focal HEENT:Gross hearing and vision intact, sclerae and conjunctiva clear Cardiovascular:  RRR, no mgr Lungs:  Dull in bases, unlabored, no rub, cough or wheeze Abdomen:  scaphoid Musculoskeletal:  Normal bulk, needed some assistance to sit up in bed Skin:  Exposed skin w/o rash or bruising   Recent Labs Lab 03/12/15 1325 03/14/15 0239  NA 136 137  K 4.2 4.7  CL 101 105  CO2 21 21  BUN 7 8  CREATININE 0.53 0.56  GLUCOSE 101* 98    Recent Labs Lab 03/12/15 1325 03/14/15 0239  HGB 10.5* 10.2*  HCT 32.0* 31.8*  WBC 4.4 4.3  PLT 624* 522*   Dg Chest 2 View  03/13/2015   CLINICAL DATA:  Cough  EXAM: CHEST  2 VIEW  COMPARISON:  March 03, 2015 chest radiograph and chest CT February 21, 2015  FINDINGS: There is a small pleural effusion on the right. No pneumothorax appreciable. There is mild bibasilar atelectasis. Lungs are otherwise clear. Heart size and pulmonary vascularity are normal. No adenopathy. There is evidence of sclerosis in  several ribs consistent with prior rib trauma. No acute fracture apparent. Nipple shadows are noted bilaterally.  IMPRESSION: Small right pleural effusion. Mild bibasilar atelectasis. No airspace consolidation or edema.   Electronically Signed   By: Lowella Grip III M.D.   On: 03/13/2015 09:20   Dg Chest Port 1 View  03/14/2015   CLINICAL DATA:  Abnormal labs, suspicious for infection.  EXAM: PORTABLE CHEST - 1 VIEW  COMPARISON:  03/12/2015  FINDINGS: There is persistent right pleural fluid or thickening in the lateral base. Mild linear basilar opacities persist and may represent scarring. Lungs are otherwise clear. Hilar, mediastinal and cardiac contours are unremarkable and unchanged. Pulmonary vasculature is normal.  IMPRESSION: Unchanged, would minimal linear basilar opacities and right lateral pleural fluid/thickening.   Electronically Signed   By: Andreas Newport M.D.   On: 03/14/2015 03:09   I reviewed radiology images. Pleural effusions have improved with small residual on right  ASSESSMENT / PLAN: Gram Positive bacteremia- This accounts for the acute illness and appears to be responding to antibiotic management.   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 She has no evidence of granulomatous disease on lung imaging, and has not had chronic cough, night sweats, weight loss to suggest subacute active TB infection in lung. I think bronchoscopy with BAL would have  very low yield, but will discuss with my colleagues on 4/4. Traditionally, a pleural bx would have been recommended, but this hospital reportedly no longer has the COPE or Abrams needle sets. As long as effusions seem to be resolving, I would not subject her to VATS for biopsy. Recommend annual CXR for surveillance. She has chronic elevation of transaminases- TB can involve any organ including kidneys, liver. It will remain in future ddx for most of her future health  problems.   CD Annamaria Boots, MD [facility] Pager: [phone] After 3:00 PM [phone]  03/15/2015, 9:15 AM

## 2015-03-16 DIAGNOSIS — J90 Pleural effusion, not elsewhere classified: Secondary | ICD-10-CM | POA: Insufficient documentation

## 2015-03-16 DIAGNOSIS — M25469 Effusion, unspecified knee: Secondary | ICD-10-CM | POA: Insufficient documentation

## 2015-03-16 DIAGNOSIS — R7881 Bacteremia: Secondary | ICD-10-CM | POA: Insufficient documentation

## 2015-03-16 LAB — URINE CULTURE: Colony Count: 1000

## 2015-03-16 LAB — CULTURE, BLOOD (SINGLE)

## 2015-03-16 LAB — CBC
HEMATOCRIT: 25.4 % — AB (ref 36.0–46.0)
Hemoglobin: 8.3 g/dL — ABNORMAL LOW (ref 12.0–15.0)
MCH: 27 pg (ref 26.0–34.0)
MCHC: 32.7 g/dL (ref 30.0–36.0)
MCV: 82.7 fL (ref 78.0–100.0)
Platelets: 423 10*3/uL — ABNORMAL HIGH (ref 150–400)
RBC: 3.07 MIL/uL — AB (ref 3.87–5.11)
RDW: 13.4 % (ref 11.5–15.5)
WBC: 4.2 10*3/uL (ref 4.0–10.5)

## 2015-03-16 LAB — COMPREHENSIVE METABOLIC PANEL
ALK PHOS: 68 U/L (ref 39–117)
ALT: 24 U/L (ref 0–35)
AST: 37 U/L (ref 0–37)
Albumin: 1.9 g/dL — ABNORMAL LOW (ref 3.5–5.2)
Anion gap: 8 (ref 5–15)
BILIRUBIN TOTAL: 0.2 mg/dL — AB (ref 0.3–1.2)
CALCIUM: 8.1 mg/dL — AB (ref 8.4–10.5)
CO2: 22 mmol/L (ref 19–32)
CREATININE: 0.55 mg/dL (ref 0.50–1.10)
Chloride: 108 mmol/L (ref 96–112)
GFR calc Af Amer: >90 mL/min (ref >90)
GFR calc non Af Amer: >90 mL/min (ref >90)
GLUCOSE: 111 mg/dL — AB (ref 70–99)
Potassium: 3.6 mmol/L (ref 3.5–5.1)
Sodium: 138 mmol/L (ref 135–145)
TOTAL PROTEIN: 5.1 g/dL — AB (ref 6.0–8.3)

## 2015-03-16 LAB — PROCALCITONIN: Procalcitonin: <0.1 ng/mL

## 2015-03-16 NOTE — Progress Notes (Signed)
RT attempted to obtain AFB at this time. Saline neb was given, BBS clear with dry, NPC. Visitor speaks great english, and attempted to assist me with no luck. Stated that pateint has not been getting anything up at all, but if she does she would hand her the cup. RN aware. RT will continue to attempt q8.

## 2015-03-16 NOTE — Progress Notes (Signed)
   Name: Nichole Perry MRN: [id number] DOB: 01-Feb-1971    ADMISSION DATE:  03/14/2015 CONSULTATION DATE:  03/15/15  REFERRING MD :  Tat  CHIEF COMPLAINT:  Knee pain  BRIEF PATIENT DESCRIPTION: 59 yoF never smoker from [place] presented 3 week hx knee pain and swelling, pleural effusions, R axillary lymph node, bronchial thickening. Goiter. Quantiferon Gold Positive. Exudative pleural effusion with fluid cx negative so far. Blood cultures + GPCs in clusters. PCCM asked to consider BAL to look for M.tb.   SIGNIFICANT EVENTS  CTa chest 3/12- bilat pleural effusions Thoracentesis 3/22- WBC 3000 (64% monos, 28% PMN, 8% EOS)   STUDIES:  Thoracentesis fluid cx neg so far for routine and AFB   SUBJECTIVE:   VITAL SIGNS: Temp:  [99.9 F (37.7 C)-100.5 F (38.1 C)] 100.5 F (38.1 C) (04/04 0543) Pulse Rate:  [90-98] 90 (04/04 0543) Resp:  [16] 16 (04/04 0543) BP: (110-116)/(59-68) 110/59 mmHg (04/04 0543) SpO2:  [97 %-100 %] 100 % (04/04 0543) Weight:  [55.3 kg (121 lb 14.6 oz)] 55.3 kg (121 lb 14.6 oz) (04/04 0543)  PHYSICAL EXAMINATION: General:  Pleasant cooperative, looks well, NAD Neuro:  Grossly intact, non-focal HEENT:Gross hearing and vision intact, sclerae and conjunctiva clear Cardiovascular:  RRR, no mgr Lungs:  Dull in bases, unlabored, no rub, cough or wheeze Abdomen:  scaphoid Musculoskeletal:  Normal bulk, needed some assistance to sit up in bed Skin:  Exposed skin w/o rash or bruising   Recent Labs Lab 03/14/15 0239 03/15/15 0940 03/16/15 0745  NA 137 134* 138  K 4.7 3.8 3.6  CL 105 107 108  CO2 21 20 22   BUN 8 <5* <5*  CREATININE 0.56 0.66 0.55  GLUCOSE 98 166* 111*    Recent Labs Lab 03/14/15 0239 03/15/15 1430 03/16/15 0745  HGB 10.2* 8.3* 8.3*  HCT 31.8* 24.9* 25.4*  WBC 4.3 3.8* 4.2  PLT 522* 434* 423*   No results found. I reviewed radiology images. Pleural effusions have improved with small residual on right  ASSESSMENT / PLAN: Coag  negative staph bacteremia- Unclear if this is a contamination vs true infection (did have 2 positive cultures on 3/31).  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 She feels better She comes [place]. She has no evidence of granulomatous disease on lung imaging, and has not had chronic cough, night sweats, weight loss to suggest subacute active TB infection in lung. Traditionally, a pleural bx would have been recommended, but we no longer offer that here.   Recommend will d/w Ramaswamy re: FOB.  annual CXR for surveillance. She has chronic elevation of transaminases- TB can involve any organ including kidneys, liver. It will remain in future ddx for most of her future health problems.    03/16/2015, 10:27 AM

## 2015-03-16 NOTE — Progress Notes (Signed)
[facility] for Infectious [facility]    Subjective: No new complaints   Antibiotics:  Anti-infectives    Start     Dose/Rate Route Frequency Ordered Stop   03/14/15 1445  ceFAZolin (ANCEF) IVPB 2 g/50 mL premix     2 g 100 mL/hr over 30 Minutes Intravenous 3 times per day 03/14/15 1426     03/14/15 1400  piperacillin-tazobactam (ZOSYN) IVPB 3.375 g  Status:  Discontinued     3.375 g 12.5 mL/hr over 240 Minutes Intravenous 3 times per day 03/14/15 1319 03/14/15 1426   03/14/15 0830  piperacillin-tazobactam (ZOSYN) IVPB 3.375 g  Status:  Discontinued     3.375 g 12.5 mL/hr over 240 Minutes Intravenous Every 8 hours 03/14/15 0217 03/14/15 0651   03/14/15 0230  vancomycin (VANCOCIN) IVPB 750 mg/150 ml premix  Status:  Discontinued     750 mg 150 mL/hr over 60 Minutes Intravenous Every 12 hours 03/14/15 0217 03/16/15 1151   03/14/15 0215  piperacillin-tazobactam (ZOSYN) IVPB 3.375 g     3.375 g 100 mL/hr over 30 Minutes Intravenous  Once 03/14/15 0213 03/14/15 0323      Medications: Scheduled Meds: .  ceFAZolin (ANCEF) IV  2 g Intravenous 3 times per day   Continuous Infusions: . sodium chloride 125 mL/hr (03/16/15 0240)   PRN Meds:.acetaminophen **OR** acetaminophen    Objective: Weight change: -10.6 oz (-0.3 kg)  Intake/Output Summary (Last 24 hours) at 03/16/15 1526 Last data filed at 03/16/15 0500  Gross per 24 hour  Intake    120 ml  Output      4 ml  Net    116 ml   Blood pressure 110/59, pulse 90, temperature 100.5 F (38.1 C), temperature source Oral, resp. rate 16, height 5\' 2"  (1.575 m), weight 121 lb 14.6 oz (55.3 kg), last menstrual period 11/19/2014, SpO2 98 %. Temp:  [99.9 F (37.7 C)-100.5 F (38.1 C)] 100.5 F (38.1 C) (04/04 0543) Pulse Rate:  [90-98] 90 (04/04 0543) Resp:  [16] 16 (04/04 0543) BP: (110-116)/(59-68) 110/59 mmHg (04/04 0543) SpO2:  [97 %-100 %] 98 % (04/04 1300) Weight:  [121 lb 14.6 oz (55.3 kg)] 121 lb 14.6 oz (55.3 kg)  (04/04 0543)  Physical Exam: General: Alert and awake, oriented x3, not in any acute distress. HEENT: anicteric sclera, pupils reactive to light and accommodation, EOMI CVS regular rate, normal r,  no murmur rubs or gallops Chest: diminished breath sounds at the bases bilaterally  Abdomen: soft nontender, nondistended, normal bowel sounds, Extremities: Both knees and both ankles are tender to palpation and ankles have some marked edema Skin: no rashes  Neuro: nonfocal  CBC: CBC Latest Ref Rng 03/16/2015 03/15/2015 03/14/2015  WBC 4.0 - 10.5 K/uL 4.2 3.8(L) 4.3  Hemoglobin 12.0 - 15.0 g/dL 8.3(L) 8.3(L) 10.2(L)  Hematocrit 36.0 - 46.0 % 25.4(L) 24.9(L) 31.8(L)  Platelets 150 - 400 K/uL 423(H) 434(H) 522(H)       BMET  Recent Labs  03/15/15 0940 03/16/15 0745  NA 134* 138  K 3.8 3.6  CL 107 108  CO2 20 22  GLUCOSE 166* 111*  BUN <5* <5*  CREATININE 0.66 0.55  CALCIUM 8.1* 8.1*     Liver Panel   Recent Labs  03/15/15 0940 03/16/15 0745  PROT 5.8* 5.1*  ALBUMIN 1.9* 1.9*  AST 41* 37  ALT 28 24  ALKPHOS 71 68  BILITOT 0.5 0.2*       Sedimentation Rate No results for input(s): ESRSEDRATE in  the last 72 hours. C-Reactive Protein No results for input(s): CRP in the last 72 hours.  Micro Results: Recent Results (from the past 720 hour(s))  Body fluid culture     Status: None   Collection Time: 03/03/15  4:41 PM  Result Value Ref Range Status   Specimen Description PLEURAL  Final   Special Requests PLEURAL  Final   Gram Stain   Final    RARE WBC PRESENT, PREDOMINANTLY PMN NO ORGANISMS SEEN Performed at [facility]    Culture   Final    NO GROWTH 3 DAYS Performed at [facility]    Report Status 03/07/2015 FINAL  Final  AFB culture with smear     Status: None (Preliminary result)   Collection Time: 03/03/15  4:41 PM  Result Value Ref Range Status   Specimen Description PLEURAL  Final   Special Requests NONE  Final   Acid Fast  Smear   Final    NO ACID FAST BACILLI SEEN Performed at [facility]    Culture   Final    CULTURE WILL BE EXAMINED FOR 6 WEEKS BEFORE ISSUING A FINAL REPORT Performed at [facility]    Report Status PENDING  Incomplete  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 tuberculosis, Direct CANCELED      Comment: Specimen already disgarded from referring Accession. Will need to recollect patient.  Result canceled by the ancillary    Source (MTBPCR) CANCELED      Comment: Result canceled by the ancillary  Culture, blood (single)     Status: None   Collection Time: 03/12/15  1:25 PM  Result Value Ref Range Status   Culture STAPHYLOCOCCUS SPECIES (COAGULASE NEGATIVE)  Final   Organism ID, Bacteria STAPHYLOCOCCUS SPECIES (COAGULASE NEGATIVE)  Final    Comment: Rifampin and Gentamicin should not be used as single drugs for treatment of Staph infections. This organism DOES NOT demonstrate inducible Clindamycin resistance in vitro. Gram Stain Report Called to,Read Back By and Verified With: DR. Reginia Forts 03/13/15 AT 2300 RIDK       Susceptibility   Staphylococcus species (coagulase negative) -  (no method available)    PENICILLIN 0.25 Resistant     OXACILLIN <=0.25 Sensitive     CEFAZOLIN  Sensitive     GENTAMICIN <=0.5 Sensitive     CIPROFLOXACIN <=0.5 Sensitive     LEVOFLOXACIN <=0.12 Sensitive     TRIMETH/SULFA <=10 Sensitive     VANCOMYCIN 1 Sensitive     CLINDAMYCIN <=0.25 Sensitive     ERYTHROMYCIN >=8 Resistant     RIFAMPIN <=0.5 Sensitive     TETRACYCLINE <=1 Sensitive   Culture, blood (single)     Status: None   Collection Time: 03/12/15  1:25 PM  Result Value Ref Range Status   Organism ID, Bacteria STAPHYLOCOCCUS SPECIES (COAGULASE NEGATIVE)  Final    Comment: Susceptibilities performed on previous culture within the last 5 days. Gram Stain Report Called to,Read Back  By and Verified With: DR. Reginia Forts 03/13/15 AT 2230 [place]   Blood Culture (routine x 2)     Status: None (Preliminary result)   Collection Time: 03/14/15  2:39 AM  Result Value Ref Range Status   Specimen Description BLOOD RIGHT WRIST  Final   Special Requests BOTTLES DRAWN AEROBIC ONLY 4CC  Final   Culture   Final  BLOOD CULTURE RECEIVED NO GROWTH TO DATE CULTURE WILL BE HELD FOR 5 DAYS BEFORE ISSUING A FINAL NEGATIVE REPORT Performed at [facility]    Report Status PENDING  Incomplete  Blood Culture (routine x 2)     Status: None (Preliminary result)   Collection Time: 03/14/15  2:46 AM  Result Value Ref Range Status   Specimen Description BLOOD LEFT ARM  Final   Special Requests BOTTLES DRAWN AEROBIC AND ANAEROBIC 5CC  Final   Culture   Final           BLOOD CULTURE RECEIVED NO GROWTH TO DATE CULTURE WILL BE HELD FOR 5 DAYS BEFORE ISSUING A FINAL NEGATIVE REPORT Performed at [facility]    Report Status PENDING  Incomplete  Urine culture     Status: None   Collection Time: 03/14/15  8:43 AM  Result Value Ref Range Status   Specimen Description URINE, RANDOM  Final   Special Requests NONE  Final   Colony Count   Final    1,000 COLONIES/ML Performed at [facility]    Culture   Final    INSIGNIFICANT GROWTH Performed at [facility]    Report Status 03/16/2015 FINAL  Final  Culture, blood (routine x 2)     Status: None (Preliminary result)   Collection Time: 03/15/15  9:40 AM  Result Value Ref Range Status   Specimen Description BLOOD LEFT ARM  Final   Special Requests BOTTLES DRAWN AEROBIC ONLY 10 CC  Final   Culture   Final           BLOOD CULTURE RECEIVED NO GROWTH TO DATE CULTURE WILL BE HELD FOR 5 DAYS BEFORE ISSUING A FINAL NEGATIVE REPORT Note: Culture results may be compromised due to an excessive volume of blood received in culture bottles. Performed at [facility]    Report Status PENDING  Incomplete   Culture, blood (routine x 2)     Status: None (Preliminary result)   Collection Time: 03/15/15  9:45 AM  Result Value Ref Range Status   Specimen Description BLOOD LEFT WRIST  Final   Special Requests BOTTLES DRAWN AEROBIC ONLY 10 CC  Final   Culture   Final           BLOOD CULTURE RECEIVED NO GROWTH TO DATE CULTURE WILL BE HELD FOR 5 DAYS BEFORE ISSUING A FINAL NEGATIVE REPORT Note: Culture results may be compromised due to an excessive volume of blood received in culture bottles. Performed at [facility]    Report Status PENDING  Incomplete    Studies/Results: No results found.    Assessment/Plan:  Principal Problem:   Coagulase negative Staphylococcus bacteremia Active Problems:   Goiter   Bilateral pleural effusion   Arthralgia   Amenorrhea   LAD (lymphadenopathy), axillary   Bilateral edema of lower extremity   Fever   Sepsis   Transaminasemia   Latent tuberculosis by blood test    Nichole Perry is a 44 y.o. female from [place] who began having fevers, chills and sweats a little over 3 weeks ago. She is also noted migratory joint swelling and pain. He first began in both feet and both knees. The swelling and pain in her feet has improved but now she has swelling in her left wrist. She is also had shortness of breath and was found to have bilateral pleural effusions on chest x-ray and CT scan. There was some atelectasis in the bases but no underlying infiltrates  were noted. She underwent thoracentesis revealing exudative effusions. Gram stain and AFB stain were negative. Routine cultures were negative and AFB cultures are negative to date.Her Quantiferon TB assay was also positive. Pleural fluid cytology showed reactive mesothelial cells. She also underwent a biopsy of her thyroid gland that was benign as well on the 22nd.   She was then seen by her primary care physician in follow-up and found to still be suffering from malaise and to be febrile. Blood cultures  were obtained on03/31/2016 in both are growing Methicillin Sensitive regulations negative staphylococcal species.  She continues to have pain in both knees as well as her ankles. She has a dry nonproductive cough.  She has several nonspecific inflammatory markers are elevated including a sedimentation rate above 100. Her rheumatoid factor antibodies are negative as well as her ANA.C3 and C4 were normal  She is currently on cefazolin.  #1 Coagulase negative staphylococcal bacteremia:  This may very well been an iatrogenic infection with her several invasive procedures are performed recently including a thoracocentesis and thyroid biopsy. She may also have had intravenous access obtained.  Her 2-D echocardiogram is negative for vegetations.  One could consider a transesophageal echocardiogram to rule out endocarditis staphylococcus looked enhances has been associated in particular with endocarditis and has a particularly virulent trajectory  Continue ancef for now and followup cultures  I am a bit skeptical that the COag negative staph is the primary process and I'm skeptical that it is caused inflamed joint pains pleural effusions and elevated biomarkers though it is not impossible and therefore obtaining a TEE may help elucidate matters-WOuld ask Cardiology how good the views they obtained were for her heart valves adn their opinion on TEE  #2 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  Her positive QuantiFERON Gold in the context of an exudative pleural effusion in a person from a highly endemic area of [place] makes me worry that she could have tuberculous infection of her pleural space.  I discussed with Dr. Chase Caller from [facility] and I think obtaining a repeat thoracocentesis for large volume (if possible ) thoracocentesis  with fluid sent again for cell count differential LDH protein,  ADENOSINE DEAMINASE, MTB PCR, and if possible IFN gamma assay along with larger volume for AFB culture, Fungal culture could be helfpul  I have also written an order for resp therapy to induce sputum 3 to ensure that she has no evidence of pulmonary tuberculosis. Imaging is certainly encouraging but pleural effusions could be masking some parenchymal disease potentially  She does have a history of lumbar surgery other she denies any back pain at present.  I spent greater than 40  minutes with the patient including greater than 50% of time in face to face counsel of the patient and in coordination of their care with [facility]  Had extensive discussions with patient and her friend who provided translation.    LOS: 2 days   Alcide Evener 03/16/2015, 3:26 PM

## 2015-03-16 NOTE — Progress Notes (Signed)
PROGRESS NOTE  Nichole Perry WYS:[id number] DOB: 04/06/1971 DOA: 03/14/2015 PCP: Maximino Greenland, MD   Brief history 44 year old female with a history of goiter and anemia presented to the hospital when she was called by her primary care provider with positive blood cultures. The patient has not been feeling well for nearly 3 weeks. The patient initially presented to her primary care provider because of bilateral knee pain and leg swelling on 02/21/2015. She was started on furosemide 20 mg daily. Chest x-ray at that time revealed small to moderate bilateral pleural effusions. CT angio of the chest was performed on 02/21/2015 which was negative for pulmonary embolus but revealed bilateral pleural effusions and central bronchial wall thickening and enlarged right axillary lymph node.  She underwent thoracocentesis on 03/03/2015.  Fluid chemistries revealed an exudative pleural effusion with WBC 3000 (64% monos, 28% PMN, 8% eosinophils). Routine Fluid culture and AFB culture are negative at this time. 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 The patient followed up on 03/07/2015, and an autoimmune workup was begun which was negative. Again, the patient represented to the primary care provider office on 03/12/2015 with 2 day history of fever up to 101.43F. Blood cultures were obtained at that time. Notably, the patient also was noted to have a right thyroid nodule which has been biopsied on 03/10/2015. The biopsy revealed benign follicular nodule. Assessment/Plan: Sepsis -Present at the time of admission -Secondary to bacteremia? -Lactic acid 2.22 -Fluid resuscitation -Continue IV cefazolin -flu is neg -Tmax 100.5 in past 24 hours -continue IVF Bacteremia--CoNS -unclear presently if true bacteremia vs contaminant as 4/2 repeat blood cultures remain neg (had not yet started abx) -may be partly contributing to clinical picture, but doubt is the entire answer -however, pt appears to be doing  clinically better on abx with improving arthralgias and decreasing joint swelling -ID following -echo--no gross vegetation -follow surveillance blood cultures--neg to date -TEE scheduled 03/18/15 QuantiFeron Positive -given pt's clinical and epidemiologic hx, will still need to r/o pulmonary TB in setting of lymphocytic effusion -may need to consider excisional biopsy of R-axillary LN--defer to ID -airborne isolation--await sputum although pt is barely coughing -appreciate pulmonary input--plans noted for repeat thoracocentesis with MTB PCR, ADA, glucose -if clinical picture remains unclear as to source of fever, may need VATS (pleural bx) -agree that pleural fluid usually low yield for AFB Transaminasemia -secondary to sepsis -trending down -No abdominal pain, no vomiting -Hepatitis B surface antigen, hepatitis C antibody--negative -HIV antibody is negative Lower extremity edema and pain -Venous duplex r/o DVT--neg Bilateral pleural effusion -03/03/2015 thoracocentesis--exudative - WBC 3000 (64% monos, 28% PMN, 8% eosinophils) Arthralgias and myalgias -Autoimmune workup has been negative including C3, C4, anti-CCP, ANA, RF, ENA, -check anti-smith and anti-dsDNA  -improving with standard antibiotics   Family Communication: husband updated at beside Disposition Plan: Home when medically stable            Procedures/Studies: Dg Chest 1 View  03/03/2015   CLINICAL DATA:  Post right thoracentesis  EXAM: CHEST  1 VIEW  COMPARISON:  Chest x-ray of 02/21/2015  FINDINGS: Some of the right pleural effusion has been evacuated. No obvious pneumothorax is seen, but on the frontal view there is a fluid level at the right lung base and a small right hydro pneumothorax is suspected. Opacity at the left lung base persists consistent with left effusion, atelectasis, and possibly pneumonia. Heart size is stable.  IMPRESSION: 1. Suspect small right hydro pneumothorax after right  thoracentesis.  2. Reduction of right pleural effusion.   Electronically Signed   By: Ivar Drape M.D.   On: 03/03/2015 16:17   Dg Chest 2 View  03/13/2015   CLINICAL DATA:  Cough  EXAM: CHEST  2 VIEW  COMPARISON:  March 03, 2015 chest radiograph and chest CT February 21, 2015  FINDINGS: There is a small pleural effusion on the right. No pneumothorax appreciable. There is mild bibasilar atelectasis. Lungs are otherwise clear. Heart size and pulmonary vascularity are normal. No adenopathy. There is evidence of sclerosis in several ribs consistent with prior rib trauma. No acute fracture apparent. Nipple shadows are noted bilaterally.  IMPRESSION: Small right pleural effusion. Mild bibasilar atelectasis. No airspace consolidation or edema.   Electronically Signed   By: Lowella Grip III M.D.   On: 03/13/2015 09:20   Dg Chest 2 View  02/21/2015   CLINICAL DATA:  Chest pain and shortness of breath. Negative pregnancy test.  EXAM: CHEST  2 VIEW  COMPARISON:  12/05/2014  FINDINGS: Lungs are adequately inflated and demonstrate small to moderate bilateral pleural effusions right greater than left which are new. Likely associated atelectasis in the lung bases as basilar infection is also possible person cardiomediastinal silhouette and remainder of the exam is unchanged.  IMPRESSION: Small to moderate bilateral pleural effusions right greater than left. Likely associated bibasilar atelectasis versus infection.   Electronically Signed   By: Marin Olp M.D.   On: 02/21/2015 14:54   Ct Angio Chest W/cm &/or Wo Cm  02/21/2015   CLINICAL DATA:  3 day history of bilateral lower extremity edema, cough, and shortness of breath with intermittent stabbing chest pain radiating to the back.  EXAM: CT ANGIOGRAPHY CHEST WITH CONTRAST  TECHNIQUE: Multidetector CT imaging of the chest was performed using the standard protocol during bolus administration of intravenous contrast. Multiplanar CT image reconstructions and MIPs were  obtained to evaluate the vascular anatomy.  CONTRAST:  134mL OMNIPAQUE IOHEXOL 350 MG/ML IV.  COMPARISON:  No prior CT. Two-view chest x-ray earlier same date and 12/05/2014.  FINDINGS: Contrast opacification of the pulmonary arteries is good respiratory motion blurred many of the images of the lung bases. Overall, the study is of good diagnostic quality.  No filling defects within either main pulmonary artery or their branches in either lung to suggest pulmonary embolism. Heart size upper normal. Small pericardial effusion. No visible atherosclerosis involving the thoracic or upper abdominal aorta or their visualized branches. No visible coronary artery atherosclerosis.  Large bilateral pleural effusions including of fluid in the major fissure on the left. Associated dense passive atelectasis in the lower lobes and right middle lobe. No confluent airspace consolidation otherwise. No pulmonary parenchymal nodules or masses. Central airways patent with marked bronchial wall thickening.  Enlarged level 1 and level 2 right axillary lymph nodes, the largest measuring approximately 2.5 x 1.4 cm. No significant mediastinal, hilar or left axillary lymphadenopathy. Mass involving the lower pole of the left lobe of the thyroid gland measuring approximately 4.0 x 3.1 x 4.2 cm.  Visualized upper abdomen unremarkable for the early arterial phase of enhancement. Focus of accessory splenic tissue posterior to the mid spleen. Bone window images demonstrate a likely benign sclerotic bone island in the T6 vertebral body.  Review of the MIP images confirms the above findings.  IMPRESSION: 1. No evidence of pulmonary embolism. 2. Large bilateral pleural effusions including fluid in the major fissure on the left with associated passive atelectasis in the lower lobes and right  middle lobe. 3. Marked central bronchial wall thickening consistent with severe bronchitis and/or asthma. 4. Small pericardial effusion. 5. Enlarged right  axillary lymph nodes without evidence of lymphadenopathy elsewhere. Isolated unilateral axillary lymphadenopathy can be seen in breast cancer, so if not recently performed, diagnostic mammography may be helpful in further evaluation. 6. Approximate 4 cm mass involving the lower pole of the left lobe of the thyroid gland. Based on size, evaluation with ultrasound and likely biopsy is indicated. This follows ACR consensus guidelines: Managing Incidental Thyroid Nodules Detected on Imaging: White Paper of the ACR Incidental Thyroid Findings Committee. J Am Coll Radiol 2015; 12:143-150.   Electronically Signed   By: Evangeline Dakin M.D.   On: 02/21/2015 19:06   US Soft Tissue Head/neck  02/25/2015   CLINICAL DATA:  Thyroid nodule.  EXAM: THYROID ULTRASOUND  TECHNIQUE: Ultrasound examination of the thyroid gland and adjacent soft tissues was performed.  COMPARISON:  Chest CT 02/21/2015  FINDINGS: Right thyroid lobe  Measurements: 5.8 x 1.8 x 1.9 cm. There is a subtle heterogeneous nodule along the medial right thyroid lobe that measures 0.6 x 0.3 x 0.7 cm. There is a heterogeneous nodule in the inferior right thyroid lobe that measures 1.0 x 0.5 x 0.8 cm.  Left thyroid lobe  Measurements: 7.1 x 2.7 x 2.6 cm. There is a dominant nodule occupying the mid and lower aspect of the left thyroid lobe. This lesion corresponds with the nodule seen on the recent CT. This dominant left thyroid nodule measures 4.7 x 2.5 x 4.1 cm. Nodule is heterogeneous with small cystic areas. Cannot exclude small calcifications in this nodule.  Isthmus  Thickness: 0.4 cm.  No nodules visualized.  Lymphadenopathy  None visualized.  IMPRESSION: Bilateral thyroid nodules. There is a dominant left thyroid nodule that measures up to 4.7 cm. Findings meet consensus criteria for biopsy. Ultrasound-guided fine needle aspiration should be considered, as per the consensus statement: Management of Thyroid Nodules Detected at [place]: Society of Radiologists  in [place]. Radiology 2005; [id number].   Electronically Signed   By: Markus Daft M.D.   On: 02/25/2015 16:21   US Thyroid Biopsy  03/10/2015   CLINICAL DATA:  44 year old female with a history of left-sided thyroid nodule. She has been referred for percutaneous biopsy.  EXAM: ULTRASOUND GUIDED NEEDLE ASPIRATE BIOPSY OF THE THYROID GLAND  COMPARISON:  02/25/2015  PROCEDURE: The procedure, risks, benefits, and alternatives were explained to the patient. Questions regarding the procedure were encouraged and answered. The patient understands and consents to the procedure.  Ultrasound survey was performed with images stored and sent to PACs.  The left neck was prepped with Betadine in a sterile fashion, and a sterile drape was applied covering the operative field. A sterile gown and sterile gloves were used for the procedure. Local anesthesia was provided with 1% Lidocaine.  Ultrasound guidance was used to infiltrate the region with 1% lidocaine for local anesthesia. Four separate 25 gauge fine needle biopsy were then acquired of the left thyroid nodule using ultrasound guidance. Images were stored.  Slide preparation was performed.  Final image was stored after biopsy.  Patient tolerated the procedure well and remained hemodynamically stable throughout.  No complications were encountered and no significant blood loss was encounter  FINDINGS: Ultrasound survey of the left thyroid demonstrates heterogeneous nodule inferiorly.  Images during the case demonstrate needle tip within the nodule on each needle pass.  Final ultrasound image demonstrates no complicating features.  IMPRESSION: Status post  ultrasound-guided biopsy of left inferior thyroid nodule. Specimen sent to pathology for complete histopathologic analysis.  Signed,  Dulcy Fanny. Earleen Newport, DO  [facility]  [facility]   Electronically Signed   By: Corrie Mckusick D.O.   On: 03/10/2015  14:44   Dg Chest Port 1 View  03/14/2015   CLINICAL DATA:  Abnormal labs, suspicious for infection.  EXAM: PORTABLE CHEST - 1 VIEW  COMPARISON:  03/12/2015  FINDINGS: There is persistent right pleural fluid or thickening in the lateral base. Mild linear basilar opacities persist and may represent scarring. Lungs are otherwise clear. Hilar, mediastinal and cardiac contours are unremarkable and unchanged. Pulmonary vasculature is normal.  IMPRESSION: Unchanged, would minimal linear basilar opacities and right lateral pleural fluid/thickening.   Electronically Signed   By: Andreas Newport M.D.   On: 03/14/2015 03:09   Dg Knee Complete 4 Views Right  03/05/2015   CLINICAL DATA:  Acute onset of right knee pain and swelling. Initial encounter.  EXAM: RIGHT KNEE - COMPLETE 4+ VIEW  COMPARISON:  None.  FINDINGS: There is no evidence of fracture or dislocation. The joint spaces are preserved. No significant degenerative change is seen; the patellofemoral joint is grossly unremarkable in appearance.  A small knee joint effusion is noted. The visualized soft tissues are otherwise unremarkable.  IMPRESSION: 1. No evidence of fracture or dislocation. 2. Small knee joint effusion noted.   Electronically Signed   By: Garald Balding M.D.   On: 03/05/2015 22:31   Dg Hand Complete Right  03/05/2015   CLINICAL DATA:  Bilateral hand pain and swelling. Initial encounter.  EXAM: RIGHT HAND - COMPLETE 3+ VIEW  COMPARISON:  None.  FINDINGS: There is no evidence of fracture or dislocation. The joint spaces are preserved. The carpal rows are intact, and demonstrate normal alignment. Mild dorsal soft tissue swelling is noted about the wrist.  IMPRESSION: No evidence of fracture or dislocation.   Electronically Signed   By: Garald Balding M.D.   On: 03/05/2015 22:30   US Thoracentesis Asp Pleural Space W/img Guide  03/03/2015   INDICATION: Dyspnea, bilateral pleural effusions; request is made for diagnostic and therapeutic right  thoracentesis.  EXAM: ULTRASOUND GUIDED DIAGNOSTIC AND THERAPEUTIC RIGHT THORACENTESIS  COMPARISON:  None.  MEDICATIONS: None  COMPLICATIONS: None immediate  TECHNIQUE: Informed written consent was obtained from the patient via interpreter after a discussion of the risks, benefits and alternatives to treatment. A timeout was performed prior to the initiation of the procedure.  Initial ultrasound scanning demonstrates a pleural effusion. The lower chest was prepped and draped in the usual sterile fashion. 1% lidocaine was used for local anesthesia.  An ultrasound image was saved for documentation purposes. A 6 Fr Safe-T-Centesis catheter was introduced. The thoracentesis was performed. The catheter was removed and a dressing was applied. The patient tolerated the procedure well without immediate post procedural complication. The patient was escorted to have an upright chest radiograph.  FINDINGS: A total of approximately 360 cc's of slightly turbid ,yellow fluid was removed. Requested samples were sent to the laboratory.  IMPRESSION: Successful ultrasound-guided diagnostic and therapeutic right sided thoracentesis yielding 360 cc's of pleural fluid.  Read by: Rowe Robert, PA-C   Electronically Signed   By: Markus Daft M.D.   On: 03/03/2015 16:41         Subjective: She is still having some dyspnea on exertion. She has low-grade fever. Denies any chest pain, shortness of breath, nausea, vomiting, diarrhea, abdominal  pain, dysuria, hematuria. Her arthralgias are improving.  Objective: Filed Vitals:   03/15/15 1949 03/16/15 0543 03/16/15 1300 03/16/15 1500  BP: 116/68 110/59  116/70  Pulse: 98 90  99  Temp: 99.9 F (37.7 C) 100.5 F (38.1 C)  99.5 F (37.5 C)  TempSrc: Oral Oral    Resp: 16 16  18   Height:      Weight:  55.3 kg (121 lb 14.6 oz)    SpO2: 97% 100% 98% 97%    Intake/Output Summary (Last 24 hours) at 03/16/15 1636 Last data filed at 03/16/15 0500  Gross per 24 hour  Intake     120 ml  Output      4 ml  Net    116 ml   Weight change: -0.3 kg (-10.6 oz) Exam:   General:  Pt is alert, follows commands appropriately, not in acute distress  HEENT: No icterus, No thrush, No neck mass, Garza-Salinas II/AT  Cardiovascular: RRR, S1/S2, no rubs, no gallops  Respiratory: Diminished breath sound at the bases with bibasilar crackles.  Abdomen: Soft/+BS, non tender, non distended, no guarding Extremities: Mild swelling and synovitis of bilateral wrists and knees as well as bilateral ankles. Data Reviewed: Basic Metabolic Panel:  Recent Labs Lab 03/12/15 1325 03/14/15 0239 03/15/15 0940 03/16/15 0745  NA 136 137 134* 138  K 4.2 4.7 3.8 3.6  CL 101 105 107 108  CO2 21 21 20 22   GLUCOSE 101* 98 166* 111*  BUN 7 8 <5* <5*  CREATININE 0.53 0.56 0.66 0.55  CALCIUM 8.9 8.8 8.1* 8.1*   Liver Function Tests:  Recent Labs Lab 03/12/15 1325 03/14/15 0239 03/15/15 0940 03/16/15 0745  AST 69* 61* 41* 37  ALT 65* 58* 28 24  ALKPHOS 93 114 71 68  BILITOT 0.4 1.2 0.5 0.2*  PROT 6.8 7.2 5.8* 5.1*  ALBUMIN 3.0* 2.5* 1.9* 1.9*   No results for input(s): LIPASE, AMYLASE in the last 168 hours. No results for input(s): AMMONIA in the last 168 hours. CBC:  Recent Labs Lab 03/12/15 1325 03/14/15 0239 03/15/15 1430 03/16/15 0745  WBC 4.4 4.3 3.8* 4.2  NEUTROABS 3.1 2.6 2.3  --   HGB 10.5* 10.2* 8.3* 8.3*  HCT 32.0* 31.8* 24.9* 25.4*  MCV 83.6 83.7 82.5 82.7  PLT 624* 522* 434* 423*   Cardiac Enzymes: No results for input(s): CKTOTAL, CKMB, CKMBINDEX, TROPONINI in the last 168 hours. BNP: Invalid input(s): POCBNP CBG: No results for input(s): GLUCAP in the last 168 hours.  Recent Results (from the past 240 hour(s))  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 tuberculosis, Direct CANCELED      Comment: Specimen already disgarded from referring Accession. Will need to recollect  patient.  Result canceled by the ancillary    Source (MTBPCR) CANCELED      Comment: Result canceled by the ancillary  Culture, blood (single)     Status: None   Collection Time: 03/12/15  1:25 PM  Result Value Ref Range Status   Culture STAPHYLOCOCCUS SPECIES (COAGULASE NEGATIVE)  Final   Organism ID, Bacteria STAPHYLOCOCCUS SPECIES (COAGULASE NEGATIVE)  Final    Comment: Rifampin and Gentamicin should not be used as single drugs for treatment of Staph infections. This organism DOES NOT demonstrate inducible Clindamycin resistance in vitro. Gram Stain Report Called to,Read Back By and Verified With: DR. Reginia Forts 03/13/15 AT [address] [place]  Susceptibility   Staphylococcus species (coagulase negative) -  (no method available)    PENICILLIN 0.25 Resistant     OXACILLIN <=0.25 Sensitive     CEFAZOLIN  Sensitive     GENTAMICIN <=0.5 Sensitive     CIPROFLOXACIN <=0.5 Sensitive     LEVOFLOXACIN <=0.12 Sensitive     TRIMETH/SULFA <=10 Sensitive     VANCOMYCIN 1 Sensitive     CLINDAMYCIN <=0.25 Sensitive     ERYTHROMYCIN >=8 Resistant     RIFAMPIN <=0.5 Sensitive     TETRACYCLINE <=1 Sensitive   Culture, blood (single)     Status: None   Collection Time: 03/12/15  1:25 PM  Result Value Ref Range Status   Organism ID, Bacteria STAPHYLOCOCCUS SPECIES (COAGULASE NEGATIVE)  Final    Comment: Susceptibilities performed on previous culture within the last 5 days. Gram Stain Report Called to,Read Back By and Verified With: DR. Reginia Forts 03/13/15 AT 2230 [place]   Blood Culture (routine x 2)     Status: None (Preliminary result)   Collection Time: 03/14/15  2:39 AM  Result Value Ref Range Status   Specimen Description BLOOD RIGHT WRIST  Final   Special Requests BOTTLES DRAWN AEROBIC ONLY 4CC  Final   Culture   Final           BLOOD CULTURE RECEIVED NO GROWTH TO DATE CULTURE WILL BE HELD FOR 5 DAYS BEFORE ISSUING A FINAL NEGATIVE REPORT Performed at [facility]     Report Status PENDING  Incomplete  Blood Culture (routine x 2)     Status: None (Preliminary result)   Collection Time: 03/14/15  2:46 AM  Result Value Ref Range Status   Specimen Description BLOOD LEFT ARM  Final   Special Requests BOTTLES DRAWN AEROBIC AND ANAEROBIC 5CC  Final   Culture   Final           BLOOD CULTURE RECEIVED NO GROWTH TO DATE CULTURE WILL BE HELD FOR 5 DAYS BEFORE ISSUING A FINAL NEGATIVE REPORT Performed at [facility]    Report Status PENDING  Incomplete  Urine culture     Status: None   Collection Time: 03/14/15  8:43 AM  Result Value Ref Range Status   Specimen Description URINE, RANDOM  Final   Special Requests NONE  Final   Colony Count   Final    1,000 COLONIES/ML Performed at [facility]    Culture   Final    INSIGNIFICANT GROWTH Performed at [facility]    Report Status 03/16/2015 FINAL  Final  Culture, blood (routine x 2)     Status: None (Preliminary result)   Collection Time: 03/15/15  9:40 AM  Result Value Ref Range Status   Specimen Description BLOOD LEFT ARM  Final   Special Requests BOTTLES DRAWN AEROBIC ONLY 10 CC  Final   Culture   Final           BLOOD CULTURE RECEIVED NO GROWTH TO DATE CULTURE WILL BE HELD FOR 5 DAYS BEFORE ISSUING A FINAL NEGATIVE REPORT Note: Culture results may be compromised due to an excessive volume of blood received in culture bottles. Performed at [facility]    Report Status PENDING  Incomplete  Culture, blood (routine x 2)     Status: None (Preliminary result)   Collection Time: 03/15/15  9:45 AM  Result Value Ref Range Status   Specimen Description BLOOD LEFT WRIST  Final   Special Requests BOTTLES DRAWN AEROBIC ONLY  10 CC  Final   Culture   Final           BLOOD CULTURE RECEIVED NO GROWTH TO DATE CULTURE WILL BE HELD FOR 5 DAYS BEFORE ISSUING A FINAL NEGATIVE REPORT Note: Culture results may be compromised due to an excessive volume of blood received in culture  bottles. Performed at [facility]    Report Status PENDING  Incomplete     Scheduled Meds: .  ceFAZolin (ANCEF) IV  2 g Intravenous 3 times per day   Continuous Infusions: . sodium chloride 125 mL/hr (03/16/15 0240)     Nichole Warda, DO  [facility] Pager [phone]  If 7PM-7AM, please contact night-coverage [email or web address] Password [email or web address] 03/16/2015, 4:36 PM   LOS: 2 days

## 2015-03-16 NOTE — Clinical Documentation Improvement (Signed)
Please specify diagnosis related to below supporting information, if appropriate.   Possible Clinical Conditions?    Expected Acute Blood Loss Anemia  Acute Blood Loss Anemia  Acute on chronic blood loss anemia  Chronic blood loss anemia  Precipitous drop in Hematocrit  Other Condition________________  Cannot Clinically Determine    Supporting Information:  PROGRESS 03/15/2015   Review of Systems:  Anemia   Consult Note 03/15/2015   HISTORY OF PRESENT ILLNESS:  From Dr Hale Bogus good summary: 44 year old female with a history of goiter and anemia presented to the hospital when she was called by her primary care provider with positive blood cultures. PAST MEDICAL HISTORY :  has a past medical history of Anemia and Goiter (02/07/2012).  Consult Note 03/14/2015   Review of Systems:  Anemia   PROGRESS 03/14/2015    44 year old female with a history of goiter and anemia presented to the hospital when she was called by her primary care provider with positive blood cultures.  H&P 03/14/2015   HPI:  Nichole Perry is a 44 y.o. female with Past medical history of anemia and goiter. Review of Systems:  Anemia   ED Prov Note 03/14/2015   HPI Comments:  Nichole Perry is a 44 y.o. female with a PMHx of anemia who presents to the Emergency Department here for abnormal labs this evening.  Anemia   PROGRESS 03/14/2015   Medical History:  Anemia     Patient's labs during this admission  Component     Latest Ref Rng 03/14/2015         2:39 AM  Hemoglobin     12.0 - 15.0 g/dL 10.2 (L)  HCT     36.0 - 46.0 % 31.8 (L)   Component     Latest Ref Rng 03/15/2015          Hemoglobin     12.0 - 15.0 g/dL 8.3 (L)  HCT     36.0 - 46.0 % 24.9 (L)      Thank You, Serena Colonel ,RN Clinical Documentation Specialist:  [place] Information Management

## 2015-03-17 ENCOUNTER — Inpatient Hospital Stay (HOSPITAL_COMMUNITY): Payer: 59

## 2015-03-17 DIAGNOSIS — R7881 Bacteremia: Secondary | ICD-10-CM

## 2015-03-17 LAB — LACTATE DEHYDROGENASE, PLEURAL OR PERITONEAL FLUID: LD FL: 124 U/L — AB (ref 3–23)

## 2015-03-17 LAB — BODY FLUID CELL COUNT WITH DIFFERENTIAL
Eos, Fluid: 8 %
Lymphs, Fluid: 75 %
MONOCYTE-MACROPHAGE-SEROUS FLUID: 8 % — AB (ref 50–90)
Neutrophil Count, Fluid: 9 % (ref 0–25)
WBC FLUID: 797 uL (ref 0–1000)

## 2015-03-17 LAB — TRIGLYCERIDES: TRIGLYCERIDES: 74 mg/dL (ref <150)

## 2015-03-17 LAB — GLUCOSE, SEROUS FLUID: Glucose, Fluid: 150 mg/dL

## 2015-03-17 LAB — SEDIMENTATION RATE: SED RATE: 86 mm/h — AB (ref 0–22)

## 2015-03-17 LAB — ANTI-DNA ANTIBODY, DOUBLE-STRANDED: ds DNA Ab: <1 [IU]/mL (ref 0–9)

## 2015-03-17 LAB — PROTEIN, BODY FLUID: Total protein, fluid: 3.1 g/dL

## 2015-03-17 LAB — CHOLESTEROL, TOTAL: Cholesterol: 106 mg/dL (ref 0–200)

## 2015-03-17 MED ORDER — LIDOCAINE HCL (PF) 1 % IJ SOLN
INTRAMUSCULAR | Status: AC
  Filled 2015-03-17: qty 10

## 2015-03-17 NOTE — Progress Notes (Signed)
[facility]    Subjective: No new complaints   Antibiotics:  Anti-infectives    Start     Dose/Rate Route Frequency Ordered Stop   03/14/15 1445  ceFAZolin (ANCEF) IVPB 2 g/50 mL premix     2 g 100 mL/hr over 30 Minutes Intravenous 3 times per day 03/14/15 1426     03/14/15 1400  piperacillin-tazobactam (ZOSYN) IVPB 3.375 g  Status:  Discontinued     3.375 g 12.5 mL/hr over 240 Minutes Intravenous 3 times per day 03/14/15 1319 03/14/15 1426   03/14/15 0830  piperacillin-tazobactam (ZOSYN) IVPB 3.375 g  Status:  Discontinued     3.375 g 12.5 mL/hr over 240 Minutes Intravenous Every 8 hours 03/14/15 0217 03/14/15 0651   03/14/15 0230  vancomycin (VANCOCIN) IVPB 750 mg/150 ml premix  Status:  Discontinued     750 mg 150 mL/hr over 60 Minutes Intravenous Every 12 hours 03/14/15 0217 03/16/15 1151   03/14/15 0215  piperacillin-tazobactam (ZOSYN) IVPB 3.375 g     3.375 g 100 mL/hr over 30 Minutes Intravenous  Once 03/14/15 0213 03/14/15 0323      Medications: Scheduled Meds: .  ceFAZolin (ANCEF) IV  2 g Intravenous 3 times per day  . lidocaine (PF)       Continuous Infusions: . sodium chloride 125 mL/hr at 03/17/15 0533   PRN Meds:.acetaminophen **OR** acetaminophen    Objective: Weight change: -1 lb 0.2 oz (-0.46 kg)  Intake/Output Summary (Last 24 hours) at 03/17/15 1401 Last data filed at 03/17/15 0600  Gross per 24 hour  Intake   1375 ml  Output      0 ml  Net   1375 ml   Blood pressure 100/65, pulse 101, temperature 100.5 F (38.1 C), temperature source Oral, resp. rate 16, height 5\' 2"  (1.575 m), weight 120 lb 14.4 oz (54.84 kg), last menstrual period 11/19/2014, SpO2 100 %. Temp:  [99.5 F (37.5 C)-102.8 F (39.3 C)] 100.5 F (38.1 C) (04/05 0517) Pulse Rate:  [99-107] 101 (04/05 0517) Resp:  [16-18] 16 (04/05 0517) BP: (100-116)/(62-75) 100/65 mmHg (04/05 1359) SpO2:  [96 %-100 %] 100 % (04/05 0517) Weight:  [120 lb 14.4 oz  (54.84 kg)] 120 lb 14.4 oz (54.84 kg) (04/05 0517)  Physical Exam: General: Alert and awake, oriented x3, not in any acute distress. HEENT: anicteric sclera, pupils reactive to light and accommodation, EOMI CVS regular rate, normal r,  no murmur rubs or gallops Chest: diminished breath sounds at the bases bilaterally  Abdomen: soft nontender, nondistended, normal bowel sounds, Extremities: Both knees and both ankles are tender to palpation and ankles have some marked edema Skin: no rashes Lymph: few palpable axillary LN on the right but subtle, no cervical la or left sided LA Neuro: nonfocal  CBC: CBC Latest Ref Rng 03/16/2015 03/15/2015 03/14/2015  WBC 4.0 - 10.5 K/uL 4.2 3.8(L) 4.3  Hemoglobin 12.0 - 15.0 g/dL 8.3(L) 8.3(L) 10.2(L)  Hematocrit 36.0 - 46.0 % 25.4(L) 24.9(L) 31.8(L)  Platelets 150 - 400 K/uL 423(H) 434(H) 522(H)       BMET  Recent Labs  03/15/15 0940 03/16/15 0745  NA 134* 138  K 3.8 3.6  CL 107 108  CO2 20 22  GLUCOSE 166* 111*  BUN <5* <5*  CREATININE 0.66 0.55  CALCIUM 8.1* 8.1*     Liver Panel   Recent Labs  03/15/15 0940 03/16/15 0745  PROT 5.8* 5.1*  ALBUMIN 1.9* 1.9*  AST 41* 37  ALT 28 24  ALKPHOS 71 68  BILITOT 0.5 0.2*       Sedimentation Rate No results for input(s): ESRSEDRATE in the last 72 hours. C-Reactive Protein No results for input(s): CRP in the last 72 hours.  Micro Results: Recent Results (from the past 720 hour(s))  Body fluid culture     Status: None   Collection Time: 03/03/15  4:41 PM  Result Value Ref Range Status   Specimen Description PLEURAL  Final   Special Requests PLEURAL  Final   Gram Stain   Final    RARE WBC PRESENT, PREDOMINANTLY PMN NO ORGANISMS SEEN Performed at [facility]    Culture   Final    NO GROWTH 3 DAYS Performed at [facility]    Report Status 03/07/2015 FINAL  Final  AFB culture with smear     Status: None (Preliminary result)   Collection Time: 03/03/15   4:41 PM  Result Value Ref Range Status   Specimen Description PLEURAL  Final   Special Requests NONE  Final   Acid Fast Smear   Final    NO ACID FAST BACILLI SEEN Performed at [facility]    Culture   Final    CULTURE WILL BE EXAMINED FOR 6 WEEKS BEFORE ISSUING A FINAL REPORT Performed at [facility]    Report Status PENDING  Incomplete  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 tuberculosis, Direct CANCELED      Comment: Specimen already disgarded from referring Access[facility]. Will need to recollect patient.  Result canceled by the ancillary    Source (MTBPCR) CANCELED      Comment: Result canceled by the ancillary  Culture, blood (single)     Status: None   Collection Time: 03/12/15  1:25 PM  Result Value Ref Range Status   Culture STAPHYLOCOCCUS SPECIES (COAGULASE NEGATIVE)  Final   Organism ID, Bacteria STAPHYLOCOCCUS SPECIES (COAGULASE NEGATIVE)  Final    Comment: Rifampin and Gentamicin should not be used as single drugs for treatment of Staph infections. This organism DOES NOT demonstrate inducible Clindamycin resistance in vitro. Gram Stain Report Called to,Read Back By and Verified With: DR. Reginia Forts 03/13/15 AT 2300 RIDK       Susceptibility   Staphylococcus species (coagulase negative) -  (no method available)    PENICILLIN 0.25 Resistant     OXACILLIN <=0.25 Sensitive     CEFAZOLIN  Sensitive     GENTAMICIN <=0.5 Sensitive     CIPROFLOXACIN <=0.5 Sensitive     LEVOFLOXACIN <=0.12 Sensitive     TRIMETH/SULFA <=10 Sensitive     VANCOMYCIN 1 Sensitive     CLINDAMYCIN <=0.25 Sensitive     ERYTHROMYCIN >=8 Resistant     RIFAMPIN <=0.5 Sensitive     TETRACYCLINE <=1 Sensitive   Culture, blood (single)     Status: None   Collection Time: 03/12/15  1:25 PM  Result Value Ref Range Status   Organism ID, Bacteria STAPHYLOCOCCUS SPECIES (COAGULASE NEGATIVE)  Final     Comment: Susceptibilities performed on previous culture within the last 5 days. Gram Stain Report Called to,Read Back By and Verified With: DR. Reginia Forts 03/13/15 AT 2230 [place]   Blood Culture (routine x 2)     Status: None (Preliminary result)   Collection Time: 03/14/15  2:39 AM  Result Value Ref Range Status  Specimen Description BLOOD RIGHT WRIST  Final   Special Requests BOTTLES DRAWN AEROBIC ONLY 4CC  Final   Culture   Final           BLOOD CULTURE RECEIVED NO GROWTH TO DATE CULTURE WILL BE HELD FOR 5 DAYS BEFORE ISSUING A FINAL NEGATIVE REPORT Performed at [facility]    Report Status PENDING  Incomplete  Blood Culture (routine x 2)     Status: None (Preliminary result)   Collection Time: 03/14/15  2:46 AM  Result Value Ref Range Status   Specimen Description BLOOD LEFT ARM  Final   Special Requests BOTTLES DRAWN AEROBIC AND ANAEROBIC 5CC  Final   Culture   Final           BLOOD CULTURE RECEIVED NO GROWTH TO DATE CULTURE WILL BE HELD FOR 5 DAYS BEFORE ISSUING A FINAL NEGATIVE REPORT Performed at [facility]    Report Status PENDING  Incomplete  Urine culture     Status: None   Collection Time: 03/14/15  8:43 AM  Result Value Ref Range Status   Specimen Description URINE, RANDOM  Final   Special Requests NONE  Final   Colony Count   Final    1,000 COLONIES/ML Performed at [facility]    Culture   Final    INSIGNIFICANT GROWTH Performed at [facility]    Report Status 03/16/2015 FINAL  Final  Culture, blood (routine x 2)     Status: None (Preliminary result)   Collection Time: 03/15/15  9:40 AM  Result Value Ref Range Status   Specimen Description BLOOD LEFT ARM  Final   Special Requests BOTTLES DRAWN AEROBIC ONLY 10 CC  Final   Culture   Final           BLOOD CULTURE RECEIVED NO GROWTH TO DATE CULTURE WILL BE HELD FOR 5 DAYS BEFORE ISSUING A FINAL NEGATIVE REPORT Note: Culture results may be compromised due to an excessive  volume of blood received in culture bottles. Performed at [facility]    Report Status PENDING  Incomplete  Culture, blood (routine x 2)     Status: None (Preliminary result)   Collection Time: 03/15/15  9:45 AM  Result Value Ref Range Status   Specimen Description BLOOD LEFT WRIST  Final   Special Requests BOTTLES DRAWN AEROBIC ONLY 10 CC  Final   Culture   Final           BLOOD CULTURE RECEIVED NO GROWTH TO DATE CULTURE WILL BE HELD FOR 5 DAYS BEFORE ISSUING A FINAL NEGATIVE REPORT Note: Culture results may be compromised due to an excessive volume of blood received in culture bottles. Performed at [facility]    Report Status PENDING  Incomplete    Studies/Results: No results found.    Assessment/Plan:  Principal Problem:   Coagulase negative Staphylococcus bacteremia Active Problems:   Goiter   Bilateral pleural effusion   Arthralgia   Amenorrhea   LAD (lymphadenopathy), axillary   Bilateral edema of lower extremity   Fever   Sepsis   Transaminasemia   Latent tuberculosis by blood test   Bacteremia   Knee effusion   Pleural effusion    Nichole Perry is a 44 y.o. female from [place] who began having fevers, chills and sweats a little over 3 weeks ago. She is also noted migratory joint swelling and pain. He first began in both feet and both knees. The swelling and pain in  her feet has improved but now she has swelling in her left wrist. She is also had shortness of breath and was found to have bilateral pleural effusions on chest x-ray and CT scan. There was some atelectasis in the bases but no underlying infiltrates were noted. She underwent thoracentesis revealing exudative effusions. Gram stain and AFB stain were negative. Routine cultures were negative and AFB cultures are negative to date.Her Quantiferon TB assay was also positive. Pleural fluid cytology showed reactive mesothelial cells. She also underwent a biopsy of her thyroid gland that was  benign as well on the 22nd.   She was then seen by her primary care physician in follow-up and found to still be suffering from malaise and to be febrile. Blood cultures were obtained on03/31/2016 in both are growing Methicillin Sensitive regulations negative staphylococcal species.  She continues to have pain in both knees as well as her ankles. She has a dry nonproductive cough.  She has several nonspecific inflammatory markers are elevated including a sedimentation rate above 100. Her rheumatoid factor antibodies are negative as well as her ANA.C3 and C4 were normal  She is currently on cefazolin.  #1 Coagulase negative staphylococcal bacteremia: Strange that it is NOT growing on repeat cultures here on the 2nd  This may very well been an iatrogenic infection with her several invasive procedures are performed recently including a thoracocentesis and thyroid biopsy. She may also have had intravenous access obtained.  Her 2-D echocardiogram is negative for vegetations.  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  Her positive QuantiFERON Gold in the context of an exudative pleural effusion in a person from a highly endemic area of [place] makes me worry that she could have tuberculous infection of her pleural space.  I discussed with Dr. Chase Caller yesterday from [facility] and I think obtaining a repeat thoracocentesis for large volume (if possible ) thoracocentesis with fluid sent again for cell count differential LDH protein,  Pete Babcock's note today did not mention repeat thoracocentesis. Would ask [facility] again re this, IR could also do it to send for :  ADENOSINE DEAMINASE, MTB PCR, and if possible  IFN gamma assay along with larger volume for AFB culture, Fungal culture could be helfpul  She could not make sputum yesterday with induction, we need documentation of at least one more attempt if not two with induction by RT  #3 Inguinal LA: agree that excisional lymph node biopsy by [facility] (vs IR guided, less high yield) might help [place] figure out what is going on  Would make sure LN sent for PATHOLOGY AND AFB SMEAR AND CULTURE, fungal smear and culture      LOS: 3 days   Rhina Brackett Dam 03/17/2015, 2:01 PM

## 2015-03-17 NOTE — Progress Notes (Signed)
Name: Nichole Perry MRN: [id number] DOB: 1971/07/29    ADMISSION DATE:  03/14/2015 CONSULTATION DATE:  03/15/15  REFERRING MD :  Tat  CHIEF COMPLAINT:  Knee pain  BRIEF PATIENT DESCRIPTION: 54 yoF never smoker from [place] presented 3 week hx knee pain and swelling, pleural effusions, R axillary lymph node, bronchial thickening. Goiter. Quantiferon Gold Positive. Exudative pleural effusion with fluid cx negative so far. Blood cultures + GPCs in clusters. PCCM asked to consider BAL to look for M.tb.   SIGNIFICANT EVENTS  CTa chest 3/12- bilat pleural effusions Thoracentesis 3/22- WBC 3000 (64% monos, 28% PMN, 8% EOS)   STUDIES:  Thoracentesis fluid cx neg so far for routine and AFB   SUBJECTIVE:  No sig change   VITAL SIGNS: Temp:  [99.5 F (37.5 C)-102.8 F (39.3 C)] 100.5 F (38.1 C) (04/05 0517) Pulse Rate:  [99-107] 101 (04/05 0517) Resp:  [16-18] 16 (04/05 0517) BP: (107-116)/(62-75) 107/62 mmHg (04/05 0517) SpO2:  [96 %-100 %] 100 % (04/05 0517) Weight:  [54.84 kg (120 lb 14.4 oz)] 54.84 kg (120 lb 14.4 oz) (04/05 0517)  PHYSICAL EXAMINATION: General:  Pleasant cooperative, looks well, NAD Neuro:  Grossly intact, non-focal HEENT:Gross hearing and vision intact, sclerae and conjunctiva clear Cardiovascular:  RRR, no mgr Lungs:  Dull in bases, unlabored, no rub, cough or wheeze Abdomen:  scaphoid Musculoskeletal:  Normal bulk, needed some assistance to sit up in bed Skin:  Exposed skin w/o rash or bruising   Recent Labs Lab 03/14/15 0239 03/15/15 0940 03/16/15 0745  NA 137 134* 138  K 4.7 3.8 3.6  CL 105 107 108  CO2 21 20 22   BUN 8 <5* <5*  CREATININE 0.56 0.66 0.55  GLUCOSE 98 166* 111*    Recent Labs Lab 03/14/15 0239 03/15/15 1430 03/16/15 0745  HGB 10.2* 8.3* 8.3*  HCT 31.8* 24.9* 25.4*  WBC 4.3 3.8* 4.2  PLT 522* 434* 423*   No results found. I reviewed radiology images. Pleural effusions have improved with small residual on  right  ASSESSMENT / PLAN: Coag negative staph bacteremia- Unclear if this is a contamination vs true infection (did have 2 positive cultures on 3/31).  Latent Tuberculosis by blood test(Positive Quantiferon):  Discussion. She feels better She [place] endemic [place]. She has no evidence of granulomatous disease on lung imaging, and has not had chronic cough, night sweats, weight loss to suggest subacute active TB infection in lung. Traditionally, a pleural bx would have been recommended, but we no longer offer that here.   Recommend Annual CXR for surveillance. Repeat evaluation for possible thora pending  She has chronic elevation of transaminases- TB can involve any organ including kidneys, liver. It will remain in future ddx for most of her future health problems.  Patient seen and examined, feels better this AM, bibasilar rales noted.  PCT is <0.10 which makes infections very unlikely.  I reviewed the CT of the chest myself and this is unlikely to be active TB.  The granulomas seen can only be seen in miliary TB if TB is the consideration here.  Auto-immune work negative thus far however ADA and INF-gama still pending.  Ultimately will need a pleural biopsy but that is no longer offered in [facility].  If ADA is negative recommendations at this point would be to transfer to a facility where they are capable of doing a pleural biopsy or at this point discharging her and having [facility] in[facility] to facilitate pleural biopsy performance.  Will see again in AM.  Patient seen and examined, agree with above note.  I dictated the care and orders written for this patient under my direction.  Rush Farmer, MD [phone]  03/17/2015, 9:00 AM

## 2015-03-17 NOTE — Procedures (Signed)
   US guided Rt thora 580 cc yellow fluid  Sent for labs  cxr pending

## 2015-03-17 NOTE — Progress Notes (Signed)
PROGRESS NOTE  Nichole Perry ZES:[id number] DOB: [birth year] DOA: 03/14/2015 PCP: Maximino Greenland, MD  Brief history 44 year old female with a history of goiter and anemia presented to the hospital when she was called by her primary care provider with positive blood cultures. The patient has not been feeling well for nearly 3 weeks. The patient initially presented to her primary care provider because of bilateral knee pain and leg swelling on 02/21/2015. She was started on furosemide 20 mg daily. Chest x-ray at that time revealed small to moderate bilateral pleural effusions. CT angio of the chest was performed on 02/21/2015 which was negative for pulmonary embolus but revealed bilateral pleural effusions and central bronchial wall thickening and enlarged right axillary lymph node. She underwent thoracocentesis on 03/03/2015. Fluid chemistries revealed an exudative pleural effusion with WBC 3000 (64% monos, 28% PMN, 8% eosinophils). Routine Fluid culture and AFB culture are negative at this time. 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 The patient followed up on 03/07/2015, and an autoimmune workup was begun which was negative. Again, the patient represented to the primary care provider office on 03/12/2015 with 2 day history of fever up to 101.35F. Blood cultures were obtained at that time are growing CoNS. Notably, the patient also was noted to have a right thyroid nodule which has been biopsied on 03/10/2015. The biopsy revealed benign follicular nodule. Since admission, the patient has continued to have fevers. Workup continues in progress. Assessment/Plan: Sepsis -Present at the time of admission -Secondary to bacteremia? -Lactic acid 2.22 -Continue IV cefazolin -flu is neg -Tmax 102.58F in past 24 hours -continue IVF Bacteremia--CoNS -unclear presently if true bacteremia vs contaminant as 4/2 repeat blood cultures remain neg (had not yet started abx) -may be partly contributing to  clinical picture, but doubt is the entire answer -however, pt appears to be doing clinically better on abx with improving arthralgias and decreasing joint swelling -ID following -echo--no gross vegetation -follow surveillance blood cultures--neg to date -TEE scheduled 03/18/15 QuantiFeron Positive/Exudative pleural effusion -03/17/15 repeat thoracocentesis WBC 797 (75% lymphs), glucose 150-- MTB PCR, ADA, glucose, cytology, culture -given pt's clinical and epidemiologic hx, will still need to r/o pulmonary TB in setting of lymphocytic effusion -may need to consider excisional biopsy of R-axillary LN--if repeat thoracocentesis is unrevealing -airborne isolation--await sputum although pt is barely coughing -if clinical picture remains unclear as to source of fever, may need VATS (pleural bx) -agree that pleural fluid usually low yield for AFB Transaminasemia -secondary to sepsis -trending down -No abdominal pain, no vomiting -Hepatitis B surface antigen, hepatitis C antibody--negative -HIV antibody is negative Lower extremity edema and pain -Venous duplex r/o DVT--neg Bilateral pleural effusion -03/03/2015 thoracocentesis--exudative - WBC 3000 (64% monos, 28% PMN, 8% eosinophils) Arthralgias and myalgias -Autoimmune workup has been negative including C3, C4, anti-CCP, ANA, RF, ENA, anti-dsDNA -check anti-smith--pending -improving with standard antibiotics   Family Communication: husband updated at beside 03/16/15 Disposition Plan: Home when medically stable    Procedures/Studies: Dg Chest 1 View  03/17/2015   CLINICAL DATA:  Right sided thoracentesis, status post removal of 580 cc of yellow fluid. Cough.  EXAM: CHEST  1 VIEW  COMPARISON:  03/14/2015  FINDINGS: No pneumothorax. Mild atelectasis medially at the right lung base and the right perihilar region. Prior blunting of the right costophrenic angle resolved. There is a moderate left pleural effusion silhouetting the left heart  border.  Borderline enlargement of the cardiopericardial silhouette.  IMPRESSION: 1. Resolution of prior right pleural effusion,  with some residual medial right-sided atelectasis. Increase in left pleural effusion, currently moderate in size, with associated passive atelectasis.   Electronically Signed   By: Van Clines M.D.   On: 03/17/2015 14:56   Dg Chest 1 View  03/03/2015   CLINICAL DATA:  Post right thoracentesis  EXAM: CHEST  1 VIEW  COMPARISON:  Chest x-ray of 02/21/2015  FINDINGS: Some of the right pleural effusion has been evacuated. No obvious pneumothorax is seen, but on the frontal view there is a fluid level at the right lung base and a small right hydro pneumothorax is suspected. Opacity at the left lung base persists consistent with left effusion, atelectasis, and possibly pneumonia. Heart size is stable.  IMPRESSION: 1. Suspect small right hydro pneumothorax after right thoracentesis. 2. Reduction of right pleural effusion.   Electronically Signed   By: Ivar Drape M.D.   On: 03/03/2015 16:17   Dg Chest 2 View  03/13/2015   CLINICAL DATA:  Cough  EXAM: CHEST  2 VIEW  COMPARISON:  March 03, 2015 chest radiograph and chest CT February 21, 2015  FINDINGS: There is a small pleural effusion on the right. No pneumothorax appreciable. There is mild bibasilar atelectasis. Lungs are otherwise clear. Heart size and pulmonary vascularity are normal. No adenopathy. There is evidence of sclerosis in several ribs consistent with prior rib trauma. No acute fracture apparent. Nipple shadows are noted bilaterally.  IMPRESSION: Small right pleural effusion. Mild bibasilar atelectasis. No airspace consolidation or edema.   Electronically Signed   By: Lowella Grip III M.D.   On: 03/13/2015 09:20   Dg Chest 2 View  02/21/2015   CLINICAL DATA:  Chest pain and shortness of breath. Negative pregnancy test.  EXAM: CHEST  2 VIEW  COMPARISON:  12/05/2014  FINDINGS: Lungs are adequately inflated and  demonstrate small to moderate bilateral pleural effusions right greater than left which are new. Likely associated atelectasis in the lung bases as basilar infection is also possible person cardiomediastinal silhouette and remainder of the exam is unchanged.  IMPRESSION: Small to moderate bilateral pleural effusions right greater than left. Likely associated bibasilar atelectasis versus infection.   Electronically Signed   By: Marin Olp M.D.   On: 02/21/2015 14:54   Ct Angio Chest W/cm &/or Wo Cm  02/21/2015   CLINICAL DATA:  3 day history of bilateral lower extremity edema, cough, and shortness of breath with intermittent stabbing chest pain radiating to the back.  EXAM: CT ANGIOGRAPHY CHEST WITH CONTRAST  TECHNIQUE: Multidetector CT imaging of the chest was performed using the standard protocol during bolus administration of intravenous contrast. Multiplanar CT image reconstructions and MIPs were obtained to evaluate the vascular anatomy.  CONTRAST:  121mL OMNIPAQUE IOHEXOL 350 MG/ML IV.  COMPARISON:  No prior CT. Two-view chest x-ray earlier same date and 12/05/2014.  FINDINGS: Contrast opacification of the pulmonary arteries is good respiratory motion blurred many of the images of the lung bases. Overall, the study is of good diagnostic quality.  No filling defects within either main pulmonary artery or their branches in either lung to suggest pulmonary embolism. Heart size upper normal. Small pericardial effusion. No visible atherosclerosis involving the thoracic or upper abdominal aorta or their visualized branches. No visible coronary artery atherosclerosis.  Large bilateral pleural effusions including of fluid in the major fissure on the left. Associated dense passive atelectasis in the lower lobes and right middle lobe. No confluent airspace consolidation otherwise. No pulmonary parenchymal nodules or masses. Central airways  patent with marked bronchial wall thickening.  Enlarged level 1 and level 2  right axillary lymph nodes, the largest measuring approximately 2.5 x 1.4 cm. No significant mediastinal, hilar or left axillary lymphadenopathy. Mass involving the lower pole of the left lobe of the thyroid gland measuring approximately 4.0 x 3.1 x 4.2 cm.  Visualized upper abdomen unremarkable for the early arterial phase of enhancement. Focus of accessory splenic tissue posterior to the mid spleen. Bone window images demonstrate a likely benign sclerotic bone island in the T6 vertebral body.  Review of the MIP images confirms the above findings.  IMPRESSION: 1. No evidence of pulmonary embolism. 2. Large bilateral pleural effusions including fluid in the major fissure on the left with associated passive atelectasis in the lower lobes and right middle lobe. 3. Marked central bronchial wall thickening consistent with severe bronchitis and/or asthma. 4. Small pericardial effusion. 5. Enlarged right axillary lymph nodes without evidence of lymphadenopathy elsewhere. Isolated unilateral axillary lymphadenopathy can be seen in breast cancer, so if not recently performed, diagnostic mammography may be helpful in further evaluation. 6. Approximate 4 cm mass involving the lower pole of the left lobe of the thyroid gland. Based on size, evaluation with ultrasound and likely biopsy is indicated. This follows ACR consensus guidelines: Managing Incidental Thyroid Nodules Detected on Imaging: White Paper of the ACR Incidental Thyroid Findings Committee. J Am Coll Radiol 2015; 12:143-150.   Electronically Signed   By: Evangeline Dakin M.D.   On: 02/21/2015 19:06   US Soft Tissue Head/neck  02/25/2015   CLINICAL DATA:  Thyroid nodule.  EXAM: THYROID ULTRASOUND  TECHNIQUE: Ultrasound examination of the thyroid gland and adjacent soft tissues was performed.  COMPARISON:  Chest CT 02/21/2015  FINDINGS: Right thyroid lobe  Measurements: 5.8 x 1.8 x 1.9 cm. There is a subtle heterogeneous nodule along the medial right thyroid lobe  that measures 0.6 x 0.3 x 0.7 cm. There is a heterogeneous nodule in the inferior right thyroid lobe that measures 1.0 x 0.5 x 0.8 cm.  Left thyroid lobe  Measurements: 7.1 x 2.7 x 2.6 cm. There is a dominant nodule occupying the mid and lower aspect of the left thyroid lobe. This lesion corresponds with the nodule seen on the recent CT. This dominant left thyroid nodule measures 4.7 x 2.5 x 4.1 cm. Nodule is heterogeneous with small cystic areas. Cannot exclude small calcifications in this nodule.  Isthmus  Thickness: 0.4 cm.  No nodules visualized.  Lymphadenopathy  None visualized.  IMPRESSION: Bilateral thyroid nodules. There is a dominant left thyroid nodule that measures up to 4.7 cm. Findings meet consensus criteria for biopsy. Ultrasound-guided fine needle aspiration should be considered, as per the consensus statement: Management of Thyroid Nodules Detected at [place]: Society of Radiologists in [place]. Radiology 2005; [id number].   Electronically Signed   By: Markus Daft M.D.   On: 02/25/2015 16:21   US Thyroid Biopsy  03/10/2015   CLINICAL DATA:  45 year old female with a history of left-sided thyroid nodule. She has been referred for percutaneous biopsy.  EXAM: ULTRASOUND GUIDED NEEDLE ASPIRATE BIOPSY OF THE THYROID GLAND  COMPARISON:  02/25/2015  PROCEDURE: The procedure, risks, benefits, and alternatives were explained to the patient. Questions regarding the procedure were encouraged and answered. The patient understands and consents to the procedure.  Ultrasound survey was performed with images stored and sent to PACs.  The left neck was prepped with Betadine in a sterile fashion, and a sterile drape was  applied covering the operative field. A sterile gown and sterile gloves were used for the procedure. Local anesthesia was provided with 1% Lidocaine.  Ultrasound guidance was used to infiltrate the region with 1% lidocaine for local anesthesia. Four separate 25 gauge  fine needle biopsy were then acquired of the left thyroid nodule using ultrasound guidance. Images were stored.  Slide preparation was performed.  Final image was stored after biopsy.  Patient tolerated the procedure well and remained hemodynamically stable throughout.  No complications were encountered and no significant blood loss was encounter  FINDINGS: Ultrasound survey of the left thyroid demonstrates heterogeneous nodule inferiorly.  Images during the case demonstrate needle tip within the nodule on each needle pass.  Final ultrasound image demonstrates no complicating features.  IMPRESSION: Status post ultrasound-guided biopsy of left inferior thyroid nodule. Specimen sent to pathology for complete histopathologic analysis.  Signed,  Dulcy Fanny. Earleen Newport, DO  [facility]  [facility]   Electronically Signed   By: Corrie Mckusick D.O.   On: 03/10/2015 14:44   Dg Chest Port 1 View  03/14/2015   CLINICAL DATA:  Abnormal labs, suspicious for infection.  EXAM: PORTABLE CHEST - 1 VIEW  COMPARISON:  03/12/2015  FINDINGS: There is persistent right pleural fluid or thickening in the lateral base. Mild linear basilar opacities persist and may represent scarring. Lungs are otherwise clear. Hilar, mediastinal and cardiac contours are unremarkable and unchanged. Pulmonary vasculature is normal.  IMPRESSION: Unchanged, would minimal linear basilar opacities and right lateral pleural fluid/thickening.   Electronically Signed   By: Andreas Newport M.D.   On: 03/14/2015 03:09   Dg Knee Complete 4 Views Right  03/05/2015   CLINICAL DATA:  Acute onset of right knee pain and swelling. Initial encounter.  EXAM: RIGHT KNEE - COMPLETE 4+ VIEW  COMPARISON:  None.  FINDINGS: There is no evidence of fracture or dislocation. The joint spaces are preserved. No significant degenerative change is seen; the patellofemoral joint is grossly unremarkable in appearance.  A small knee joint  effusion is noted. The visualized soft tissues are otherwise unremarkable.  IMPRESSION: 1. No evidence of fracture or dislocation. 2. Small knee joint effusion noted.   Electronically Signed   By: Garald Balding M.D.   On: 03/05/2015 22:31   Dg Hand Complete Right  03/05/2015   CLINICAL DATA:  Bilateral hand pain and swelling. Initial encounter.  EXAM: RIGHT HAND - COMPLETE 3+ VIEW  COMPARISON:  None.  FINDINGS: There is no evidence of fracture or dislocation. The joint spaces are preserved. The carpal rows are intact, and demonstrate normal alignment. Mild dorsal soft tissue swelling is noted about the wrist.  IMPRESSION: No evidence of fracture or dislocation.   Electronically Signed   By: Garald Balding M.D.   On: 03/05/2015 22:30   US Thoracentesis Asp Pleural Space W/img Guide  03/17/2015   INDICATION: Symptomatic R sided pleural effusion  EXAM: US THORACENTESIS ASP PLEURAL SPACE W/IMG GUIDE  COMPARISON:  None.  MEDICATIONS: 10 cc 1% lidocaine  COMPLICATIONS: None immediate  TECHNIQUE: Informed written consent was obtained from the patient after a discussion of the risks, benefits and alternatives to treatment. A timeout was performed prior to the initiation of the procedure.  Initial ultrasound scanning demonstrates a right pleural effusion. The lower chest was prepped and draped in the usual sterile fashion. 1% lidocaine was used for local anesthesia.  Under direct ultrasound guidance, a 19 gauge, 7-cm, Yueh catheter was introduced. An ultrasound  image was saved for documentation purposes. the thoracentesis was performed. The catheter was removed and a dressing was applied. The patient tolerated the procedure well without immediate post procedural complication. The patient was escorted to have an upright chest radiograph.  FINDINGS: A total of approximately 580 cc of yellow fluid was removed. Requested samples were sent to the laboratory.  IMPRESSION: Successful ultrasound-guided R sided thoracentesis  yielding 580 cc of pleural fluid.  Read by:  Lavonia Drafts [facility]   Electronically Signed   By: Sandi Mariscal M.D.   On: 03/17/2015 15:13   US Thoracentesis Asp Pleural Space W/img Guide  03/03/2015   INDICATION: Dyspnea, bilateral pleural effusions; request is made for diagnostic and therapeutic right thoracentesis.  EXAM: ULTRASOUND GUIDED DIAGNOSTIC AND THERAPEUTIC RIGHT THORACENTESIS  COMPARISON:  None.  MEDICATIONS: None  COMPLICATIONS: None immediate  TECHNIQUE: Informed written consent was obtained from the patient via interpreter after a discussion of the risks, benefits and alternatives to treatment. A timeout was performed prior to the initiation of the procedure.  Initial ultrasound scanning demonstrates a pleural effusion. The lower chest was prepped and draped in the usual sterile fashion. 1% lidocaine was used for local anesthesia.  An ultrasound image was saved for documentation purposes. A 6 Fr Safe-T-Centesis catheter was introduced. The thoracentesis was performed. The catheter was removed and a dressing was applied. The patient tolerated the procedure well without immediate post procedural complication. The patient was escorted to have an upright chest radiograph.  FINDINGS: A total of approximately 360 cc's of slightly turbid ,yellow fluid was removed. Requested samples were sent to the laboratory.  IMPRESSION: Successful ultrasound-guided diagnostic and therapeutic right sided thoracentesis yielding 360 cc's of pleural fluid.  Read by: Rowe Robert, PA-C   Electronically Signed   By: Markus Daft M.D.   On: 03/03/2015 16:41         Subjective: Patient completed some intermittent shortness of breath, but it was better after thoracocentesis today. Denies any chest pain, cough, hemoptysis, vomiting, diarrhea, abdominal pain, dysuria, hematuria.  Objective: Filed Vitals:   03/17/15 1352 03/17/15 1359 03/17/15 1404 03/17/15 1500  BP: 113/67 100/65 104/68 115/72  Pulse:    95  Temp:     99.3 F (37.4 C)  TempSrc:      Resp:    18  Height:      Weight:      SpO2:    100%    Intake/Output Summary (Last 24 hours) at 03/17/15 1857 Last data filed at 03/17/15 1700  Gross per 24 hour  Intake   2095 ml  Output      3 ml  Net   2092 ml   Weight change: -0.46 kg (-1 lb 0.2 oz) Exam:   General:  Pt is alert, follows commands appropriately, not in acute distress  HEENT: No icterus, No thrush,  [place]/AT  Cardiovascular: RRR, S1/S2, no rubs, no gallops  Respiratory: Bibasilar crackles. No wheeze. Good air movement.  Abdomen: Soft/+BS, non tender, non distended, no guarding  Extremities: trace LE edema, No lymphangitis, No petechiae, No rashes, no synovitis  Data Reviewed: Basic Metabolic Panel:  Recent Labs Lab 03/12/15 1325 03/14/15 0239 03/15/15 0940 03/16/15 0745  NA 136 137 134* 138  K 4.2 4.7 3.8 3.6  CL 101 105 107 108  CO2 21 21 20 22   GLUCOSE 101* 98 166* 111*  BUN 7 8 <5* <5*  CREATININE 0.53 0.56 0.66 0.55  CALCIUM 8.9 8.8 8.1* 8.1*  Liver Function Tests:  Recent Labs Lab 03/12/15 1325 03/14/15 0239 03/15/15 0940 03/16/15 0745  AST 69* 61* 41* 37  ALT 65* 58* 28 24  ALKPHOS 93 114 71 68  BILITOT 0.4 1.2 0.5 0.2*  PROT 6.8 7.2 5.8* 5.1*  ALBUMIN 3.0* 2.5* 1.9* 1.9*   No results for input(s): LIPASE, AMYLASE in the last 168 hours. No results for input(s): AMMONIA in the last 168 hours. CBC:  Recent Labs Lab 03/12/15 1325 03/14/15 0239 03/15/15 1430 03/16/15 0745  WBC 4.4 4.3 3.8* 4.2  NEUTROABS 3.1 2.6 2.3  --   HGB 10.5* 10.2* 8.3* 8.3*  HCT 32.0* 31.8* 24.9* 25.4*  MCV 83.6 83.7 82.5 82.7  PLT 624* 522* 434* 423*   Cardiac Enzymes: No results for input(s): CKTOTAL, CKMB, CKMBINDEX, TROPONINI in the last 168 hours. BNP: Invalid input(s): POCBNP CBG: No results for input(s): GLUCAP in the last 168 hours.  Recent Results (from the past 240 hour(s))  M. Tuberculosis complex by PCR     Status: None   Collection Time:  03/10/15  8:55 PM  Result Value Ref Range Status   M. tuberculosis, Direct CANCELED      Comment: Specimen already disgarded from referring Accession. Will need to recollect patient.  Result canceled by the ancillary    Source (MTBPCR) CANCELED      Comment: Result canceled by the ancillary  Culture, blood (single)     Status: None   Collection Time: 03/12/15  1:25 PM  Result Value Ref Range Status   Culture STAPHYLOCOCCUS SPECIES (COAGULASE NEGATIVE)  Final   Organism ID, Bacteria STAPHYLOCOCCUS SPECIES (COAGULASE NEGATIVE)  Final    Comment: Rifampin and Gentamicin should not be used as single drugs for treatment of Staph infections. This organism DOES NOT demonstrate inducible Clindamycin resistance in vitro. Gram Stain Report Called to,Read Back By and Verified With: DR. Reginia Forts 03/13/15 AT 2300 RIDK       Susceptibility   Staphylococcus species (coagulase negative) -  (no method available)    PENICILLIN 0.25 Resistant     OXACILLIN <=0.25 Sensitive     CEFAZOLIN  Sensitive     GENTAMICIN <=0.5 Sensitive     CIPROFLOXACIN <=0.5 Sensitive     LEVOFLOXACIN <=0.12 Sensitive     TRIMETH/SULFA <=10 Sensitive     VANCOMYCIN 1 Sensitive     CLINDAMYCIN <=0.25 Sensitive     ERYTHROMYCIN >=8 Resistant     RIFAMPIN <=0.5 Sensitive     TETRACYCLINE <=1 Sensitive   Culture, blood (single)     Status: None   Collection Time: 03/12/15  1:25 PM  Result Value Ref Range Status   Organism ID, Bacteria STAPHYLOCOCCUS SPECIES (COAGULASE NEGATIVE)  Final    Comment: Susceptibilities performed on previous culture within the last 5 days. Gram Stain Report Called to,Read Back By and Verified With: DR. Reginia Forts 03/13/15 AT 2230 [place]   Blood Culture (routine x 2)     Status: None (Preliminary result)   Collection Time: 03/14/15  2:39 AM  Result Value Ref Range Status   Specimen Description BLOOD RIGHT WRIST  Final   Special Requests BOTTLES DRAWN AEROBIC ONLY 4CC  Final    Culture   Final           BLOOD CULTURE RECEIVED NO GROWTH TO DATE CULTURE WILL BE HELD FOR 5 DAYS BEFORE ISSUING A FINAL NEGATIVE REPORT Performed at [facility]    Report Status PENDING  Incomplete  Blood Culture (routine x  2)     Status: None (Preliminary result)   Collection Time: 03/14/15  2:46 AM  Result Value Ref Range Status   Specimen Description BLOOD LEFT ARM  Final   Special Requests BOTTLES DRAWN AEROBIC AND ANAEROBIC 5CC  Final   Culture   Final           BLOOD CULTURE RECEIVED NO GROWTH TO DATE CULTURE WILL BE HELD FOR 5 DAYS BEFORE ISSUING A FINAL NEGATIVE REPORT Performed at [facility]    Report Status PENDING  Incomplete  Urine culture     Status: None   Collection Time: 03/14/15  8:43 AM  Result Value Ref Range Status   Specimen Description URINE, RANDOM  Final   Special Requests NONE  Final   Colony Count   Final    1,000 COLONIES/ML Performed at [facility]    Culture   Final    INSIGNIFICANT GROWTH Performed at [facility]    Report Status 03/16/2015 FINAL  Final  Culture, blood (routine x 2)     Status: None (Preliminary result)   Collection Time: 03/15/15  9:40 AM  Result Value Ref Range Status   Specimen Description BLOOD LEFT ARM  Final   Special Requests BOTTLES DRAWN AEROBIC ONLY 10 CC  Final   Culture   Final           BLOOD CULTURE RECEIVED NO GROWTH TO DATE CULTURE WILL BE HELD FOR 5 DAYS BEFORE ISSUING A FINAL NEGATIVE REPORT Note: Culture results may be compromised due to an excessive volume of blood received in culture bottles. Performed at [facility]    Report Status PENDING  Incomplete  Culture, blood (routine x 2)     Status: None (Preliminary result)   Collection Time: 03/15/15  9:45 AM  Result Value Ref Range Status   Specimen Description BLOOD LEFT WRIST  Final   Special Requests BOTTLES DRAWN AEROBIC ONLY 10 CC  Final   Culture   Final           BLOOD CULTURE RECEIVED NO GROWTH TO  DATE CULTURE WILL BE HELD FOR 5 DAYS BEFORE ISSUING A FINAL NEGATIVE REPORT Note: Culture results may be compromised due to an excessive volume of blood received in culture bottles. Performed at [facility]    Report Status PENDING  Incomplete     Scheduled Meds: .  ceFAZolin (ANCEF) IV  2 g Intravenous 3 times per day  . lidocaine (PF)       Continuous Infusions: . sodium chloride 125 mL/hr at 03/17/15 0533     Nichole Boyajian, DO  [facility] Pager [phone]  If 7PM-7AM, please contact night-coverage [email or web address] Password [email or web address] 03/17/2015, 6:57 PM   LOS: 3 days

## 2015-03-18 DIAGNOSIS — R7611 Nonspecific reaction to tuberculin skin test without active tuberculosis: Secondary | ICD-10-CM

## 2015-03-18 DIAGNOSIS — B9561 Methicillin susceptible Staphylococcus aureus infection as the cause of diseases classified elsewhere: Secondary | ICD-10-CM

## 2015-03-18 DIAGNOSIS — A419 Sepsis, unspecified organism: Principal | ICD-10-CM

## 2015-03-18 DIAGNOSIS — M25469 Effusion, unspecified knee: Secondary | ICD-10-CM

## 2015-03-18 LAB — AMYLASE, PLEURAL FLUID: Amylase, Pleural Fluid: 16 U/L

## 2015-03-18 LAB — EPSTEIN-BARR VIRUS VCA ANTIBODY PANEL
EBV NA IGG: 131 U/mL — AB (ref 0.0–17.9)
EBV VCA IGG: 31.5 U/mL — AB (ref 0.0–17.9)
EBV VCA IgM: <36 U/mL (ref 0.0–35.9)

## 2015-03-18 LAB — C-REACTIVE PROTEIN: CRP: 19.8 mg/dL — ABNORMAL HIGH (ref <0.60)

## 2015-03-18 LAB — ANTI-SMITH ANTIBODY: ENA SM AB SER-ACNC: NEGATIVE

## 2015-03-18 LAB — CMV ANTIBODY, IGG (EIA): CMV AB - IGG: 5.2 U/mL — AB (ref 0.00–0.59)

## 2015-03-18 LAB — CMV IGM

## 2015-03-18 LAB — PROCALCITONIN: Procalcitonin: 0.1 ng/mL

## 2015-03-18 MED ORDER — MIDAZOLAM HCL 5 MG/ML IJ SOLN
INTRAMUSCULAR | Status: AC
  Filled 2015-03-18: qty 2

## 2015-03-18 MED ORDER — FENTANYL CITRATE 0.05 MG/ML IJ SOLN
INTRAMUSCULAR | Status: AC
  Filled 2015-03-18: qty 2

## 2015-03-18 NOTE — Progress Notes (Signed)
Per Endo TEE will be postponed until TB can be ruled out or confirmed. Endo also states that they will notify MD of this change. Attending MD also made aware on rounds. RN will continue to monitor

## 2015-03-18 NOTE — Progress Notes (Signed)
Name: Nichole Perry MRN: [id number] DOB: 06-16-1971    ADMISSION DATE:  03/14/2015 CONSULTATION DATE:  03/15/15  REFERRING MD :  Tat  CHIEF COMPLAINT:  Knee pain  BRIEF PATIENT DESCRIPTION: 25 yoF never smoker from [place] presented 3 week hx knee pain and swelling, pleural effusions, R axillary lymph node, bronchial thickening. Goiter. Quantiferon Gold Positive. Exudative pleural effusion with fluid cx negative so far. Blood cultures + GPCs in clusters. PCCM asked to consider BAL to look for M.tb.   SIGNIFICANT EVENTS  CTa chest 3/12- bilat pleural effusions Thoracentesis 3/22- WBC 3000 (64% monos, 28% PMN, 8% EOS)   STUDIES:  Thoracentesis fluid cx neg so far for routine and AFB   SUBJECTIVE:  No sig change   VITAL SIGNS: Temp:  [98.8 F (37.1 C)-99.3 F (37.4 C)] 98.8 F (37.1 C) (04/06 0454) Pulse Rate:  [88-95] 88 (04/06 0454) Resp:  [18] 18 (04/06 0454) BP: (100-121)/(65-72) 105/72 mmHg (04/06 0454) SpO2:  [100 %] 100 % (04/06 0454) Weight:  [55.566 kg (122 lb 8 oz)] 55.566 kg (122 lb 8 oz) (04/06 0454)  PHYSICAL EXAMINATION: General:  Pleasant cooperative, looks well, NAD, sleep this AM. Neuro:  Grossly intact, non-focal HEENT:Gross hearing and vision intact, sclerae and conjunctiva clear Cardiovascular:  RRR, no mgr Lungs:  Dull in bases, unlabored, no rub, cough or wheeze Abdomen:  scaphoid Musculoskeletal:  Normal bulk, needed some assistance to sit up in bed Skin:  Exposed skin w/o rash or bruising   Recent Labs Lab 03/14/15 0239 03/15/15 0940 03/16/15 0745  NA 137 134* 138  K 4.7 3.8 3.6  CL 105 107 108  CO2 21 20 22   BUN 8 <5* <5*  CREATININE 0.56 0.66 0.55  GLUCOSE 98 166* 111*    Recent Labs Lab 03/14/15 0239 03/15/15 1430 03/16/15 0745  HGB 10.2* 8.3* 8.3*  HCT 31.8* 24.9* 25.4*  WBC 4.3 3.8* 4.2  PLT 522* 434* 423*   Dg Chest 1 View  03/17/2015   CLINICAL DATA:  Right sided thoracentesis, status post removal of 580 cc of yellow  fluid. Cough.  EXAM: CHEST  1 VIEW  COMPARISON:  03/14/2015  FINDINGS: No pneumothorax. Mild atelectasis medially at the right lung base and the right perihilar region. Prior blunting of the right costophrenic angle resolved. There is a moderate left pleural effusion silhouetting the left heart border.  Borderline enlargement of the cardiopericardial silhouette.  IMPRESSION: 1. Resolution of prior right pleural effusion, with some residual medial right-sided atelectasis. Increase in left pleural effusion, currently moderate in size, with associated passive atelectasis.   Electronically Signed   By: Van Clines M.D.   On: 03/17/2015 14:56   US Thoracentesis Asp Pleural Space W/img Guide  03/17/2015   INDICATION: Symptomatic R sided pleural effusion  EXAM: US THORACENTESIS ASP PLEURAL SPACE W/IMG GUIDE  COMPARISON:  None.  MEDICATIONS: 10 cc 1% lidocaine  COMPLICATIONS: None immediate  TECHNIQUE: Informed written consent was obtained from the patient after a discussion of the risks, benefits and alternatives to treatment. A timeout was performed prior to the initiation of the procedure.  Initial ultrasound scanning demonstrates a right pleural effusion. The lower chest was prepped and draped in the usual sterile fashion. 1% lidocaine was used for local anesthesia.  Under direct ultrasound guidance, a 19 gauge, 7-cm, Yueh catheter was introduced. An ultrasound image was saved for documentation purposes. the thoracentesis was performed. The catheter was removed and a dressing was applied. The patient tolerated the procedure well  without immediate post procedural complication. The patient was escorted to have an upright chest radiograph.  FINDINGS: A total of approximately 580 cc of yellow fluid was removed. Requested samples were sent to the laboratory.  IMPRESSION: Successful ultrasound-guided R sided thoracentesis yielding 580 cc of pleural fluid.  Read by:  Lavonia Drafts [facility]   Electronically Signed   By:  Sandi Mariscal M.D.   On: 03/17/2015 15:13   I reviewed radiology images. Pleural effusions have improved with small residual on right  ASSESSMENT / PLAN: Coag negative staph bacteremia- Unclear if this is a contamination vs true infection (did have 2 positive cultures on 3/31).  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 [place] She has no evidence of granulomatous disease on lung imaging, and has not had chronic cough, night sweats, weight loss to suggest subacute active TB infection in lung. Traditionally, a pleural bx would have been recommended, but we no longer offer that here.   Recommend Annual CXR for surveillance. I reviewed the CXR myself, little fluid on the right and some fluid noted on the left but not enough to call large volume thora, would be unsafe to tap bedside. CRP resulted and is very elevated so is the CRP.  Rest of auto-immune work up sent yesterday pending. For TEE today looking for endocarditis. ADA, MTB-PCR and INF-gamma all sent from [facility] and are pending. She has chronic elevation of transaminases- TB can involve any organ including kidneys, liver. It will remain in future ddx for most of her future health problems. If ADA and MTB PCR are negative then recommendations would be to transfer to a center where they can perform pleural biopsy, preferably a large [facility] center that has more exposure to complicated TB infections.  Rush Farmer, M.D. [facility] Pulmonary/Critical Care Medicine. Pager: [phone]. After hours pager: [phone].  03/18/2015, 12:10 PM

## 2015-03-18 NOTE — Progress Notes (Signed)
[facility]                                                                              Patient Demographics  Nichole Perry, is a 44 y.o. female, DOB - 18-Feb-1971, BJS:[id number]  Admit date - 03/14/2015   Admitting Physician Lavina Hamman, MD  Outpatient Primary MD for the patient is Maximino Greenland, MD  LOS - 4   Chief Complaint  Patient presents with  . Abnormal Lab      Brief history 43 year old female with a history of goiter and anemia presented to the hospital when she was called by her primary care provider with positive blood cultures. The patient has not been feeling well for nearly 3 weeks. The patient initially presented to her primary care provider because of bilateral knee pain and leg swelling on 02/21/2015. She was started on furosemide 20 mg daily. Chest x-ray at that time revealed small to moderate bilateral pleural effusions. CT angio of the chest was performed on 02/21/2015 which was negative for pulmonary embolus but revealed bilateral pleural effusions and central bronchial wall thickening and enlarged right axillary lymph node. She underwent thoracocentesis on 03/03/2015. Fluid chemistries revealed an exudative pleural effusion with WBC 3000 (64% monos, 28% PMN, 8% eosinophils). Routine Fluid culture and AFB culture are negative at this time. 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 The patient followed up on 03/07/2015, and an autoimmune workup was begun which was negative. Again, the patient represented to the primary care provider office on 03/12/2015 with 2 day history of fever up to 101.29F. Blood cultures were obtained at that time are growing CoNS. Notably, the patient also was noted to have a right thyroid nodule which has been biopsied on 03/10/2015. The biopsy revealed benign follicular nodule. Since admission, the patient has continued to have fevers. Workup continues in progress.  Assessment & Plan   Sepsis -Present at the time of admission -Secondary to  bacteremia? -Lactic acid 2.22 -Continue IV cefazolin -flu is neg -Tmax 100F in past 24hr -continue IVF  Bacteremia-- Coag negative Staph  -unclear presently if true bacteremia vs contaminant as 4/2 repeat blood cultures remain negative to date (had not yet started antibiotics) -may be partly contributing to clinical picture, but doubt is the entire answer -ID following -Echocardiogram: EF 60%, no gross vegetation -follow surveillance blood cultures--neg to date -TEE scheduled 03/19/15  QuantiFeron Positive/Exudative pleural effusion -03/17/15 repeat thoracocentesis WBC 797 (75% lymphs), glucose 150-- MTB PCR, ADA, glucose, cytology, culture -given pt's clinical and epidemiologic hx, will still need to r/o pulmonary TB in setting of lymphocytic effusion -may need to consider excisional biopsy of R-axillary LN--if repeat thoracocentesis is unrevealing -airborne isolation--await sputum although pt is barely coughing and unable to induce sputum -if clinical picture remains unclear as to source of fever, may need VATS (pleural bx)- however no longer available at [facility] -agree that pleural fluid usually low yield for AFB  Transaminasemia -secondary to sepsis -trending down -No abdominal pain, no vomiting -Hepatitis B surface antigen, hepatitis C antibody--negative -HIV antibody is negative  Lower extremity edema and pain -Venous duplex r/o DVT--neg  Bilateral pleural effusion -03/03/2015 thoracocentesis--exudative -WBC 3000 (64% monos, 28%  PMN, 8% eosinophils)  Arthralgias and myalgias -Autoimmune workup has been negative including C3, C4, anti-CCP, ANA, RF, ENA, anti-dsDNA, Anti-SmAb -improving with standard antibiotics  Code Status: Full  Family Communication: Husband at bedside  Disposition Plan: Admitted, pending TEE   Time Spent in minutes   30 minutes  Procedures  Echocardiogram  Consults   Pulmonology Interventional  radiology Infectious disease  DVT Prophylaxis  SCDs  Lab Results  Component Value Date   PLT 423* 03/16/2015    Medications  Scheduled Meds: .  ceFAZolin (ANCEF) IV  2 g Intravenous 3 times per day   Continuous Infusions: . sodium chloride 125 mL/hr at 03/18/15 0335   PRN Meds:.acetaminophen **OR** acetaminophen  Antibiotics    Anti-infectives    Start     Dose/Rate Route Frequency Ordered Stop   03/14/15 1445  ceFAZolin (ANCEF) IVPB 2 g/50 mL premix     2 g 100 mL/hr over 30 Minutes Intravenous 3 times per day 03/14/15 1426     03/14/15 1400  piperacillin-tazobactam (ZOSYN) IVPB 3.375 g  Status:  Discontinued     3.375 g 12.5 mL/hr over 240 Minutes Intravenous 3 times per day 03/14/15 1319 03/14/15 1426   03/14/15 0830  piperacillin-tazobactam (ZOSYN) IVPB 3.375 g  Status:  Discontinued     3.375 g 12.5 mL/hr over 240 Minutes Intravenous Every 8 hours 03/14/15 0217 03/14/15 0651   03/14/15 0230  vancomycin (VANCOCIN) IVPB 750 mg/150 ml premix  Status:  Discontinued     750 mg 150 mL/hr over 60 Minutes Intravenous Every 12 hours 03/14/15 0217 03/16/15 1151   03/14/15 0215  piperacillin-tazobactam (ZOSYN) IVPB 3.375 g     3.375 g 100 mL/hr over 30 Minutes Intravenous  Once 03/14/15 0213 03/14/15 0323        Subjective:   Nichole Perry seen and examined today.  Patient continues to complain of pain behind her knees.   Denies chest pain or shortness of breath, or cough, at this time.   Objective:   Filed Vitals:   03/17/15 2024 03/18/15 0454 03/18/15 1100 03/18/15 1214  BP: 121/72 105/72  107/62  Pulse: 92 88  89  Temp: 99.2 F (37.3 C) 98.8 F (37.1 C) 100 F (37.8 C) 99.5 F (37.5 C)  TempSrc:  Oral    Resp: 18 18  18   Height:      Weight:  55.566 kg (122 lb 8 oz)    SpO2: 100% 100%  100%    Wt Readings from Last 3 Encounters:  03/18/15 55.566 kg (122 lb 8 oz)  03/12/15 52.527 kg (115 lb 12.8 oz)  03/05/15 53.524 kg (118 lb)     Intake/Output  Summary (Last 24 hours) at 03/18/15 1425 Last data filed at 03/18/15 1100  Gross per 24 hour  Intake   1855 ml  Output      3 ml  Net   1852 ml    Exam  General: Well developed, well nourished  HEENT: NCAT, mucous membranes moist.   Cardiovascular: S1 S2 auscultated, no rubs, murmurs or gallops. Regular rate and rhythm.  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 tuberculosis, Direct CANCELED      Comment: Specimen already disgarded from referring Access[facility]. Will need to recollect patient.  Result canceled by the ancillary    Source (MTBPCR) CANCELED      Comment: Result canceled by the ancillary  Culture, blood (single)     Status: None   Collection Time: 03/12/15  1:25 PM  Result Value Ref Range Status   Culture STAPHYLOCOCCUS SPECIES (COAGULASE NEGATIVE)  Final   Organism ID, Bacteria STAPHYLOCOCCUS SPECIES (COAGULASE NEGATIVE)  Final    Comment: Rifampin and Gentamicin should not be used as single drugs for treatment of Staph infections. This organism DOES NOT demonstrate inducible Clindamycin resistance in vitro. Gram Stain Report Called to,Read Back By and Verified With: DR. Reginia Forts 03/13/15 AT 2300 RIDK       Susceptibility   Staphylococcus species (coagulase negative) -  (no method available)    PENICILLIN 0.25 Resistant     OXACILLIN <=0.25 Sensitive     CEFAZOLIN  Sensitive     GENTAMICIN <=0.5 Sensitive     CIPROFLOXACIN <=0.5 Sensitive     LEVOFLOXACIN <=0.12 Sensitive     TRIMETH/SULFA <=10 Sensitive     VANCOMYCIN 1 Sensitive     CLINDAMYCIN <=0.25 Sensitive     ERYTHROMYCIN >=8 Resistant      RIFAMPIN <=0.5 Sensitive     TETRACYCLINE <=1 Sensitive   Culture, blood (single)     Status: None   Collection Time: 03/12/15  1:25 PM  Result Value Ref Range Status   Organism ID, Bacteria STAPHYLOCOCCUS SPECIES (COAGULASE NEGATIVE)  Final    Comment: Susceptibilities performed on previous culture within the last 5 days. Gram Stain Report Called to,Read Back By and Verified With: DR. Reginia Forts 03/13/15 AT 2230 [place]   Blood Culture (routine x 2)     Status: None (Preliminary result)   Collection Time: 03/14/15  2:39 AM  Result Value Ref Range Status   Specimen Description BLOOD RIGHT WRIST  Final   Special Requests BOTTLES DRAWN AEROBIC ONLY 4CC  Final   Culture   Final           BLOOD CULTURE RECEIVED NO GROWTH TO DATE CULTURE WILL BE HELD FOR 5 DAYS BEFORE ISSUING A FINAL NEGATIVE REPORT Performed at [facility]    Report Status PENDING  Incomplete  Blood Culture (routine x 2)     Status: None (Preliminary result)   Collection Time: 03/14/15  2:46 AM  Result Value Ref Range Status   Specimen Description BLOOD LEFT ARM  Final   Special Requests BOTTLES DRAWN AEROBIC AND ANAEROBIC 5CC  Final   Culture   Final           BLOOD CULTURE RECEIVED NO GROWTH TO DATE CULTURE WILL BE HELD FOR 5 DAYS BEFORE ISSUING A FINAL NEGATIVE REPORT Performed at [facility]    Report Status PENDING  Incomplete  Urine culture     Status: None   Collection Time: 03/14/15  8:43 AM  Result Value Ref Range Status   Specimen Description URINE, RANDOM  Final   Special Requests NONE  Final   Colony Count   Final    1,000 COLONIES/ML Performed at [facility]    Culture   Final    INSIGNIFICANT GROWTH Performed at [facility]    Report Status 03/16/2015 FINAL  Final  Culture, blood (routine x 2)     Status: None (Preliminary result)   Collection Time: 03/15/15  9:40 AM  Result Value Ref Range Status   Specimen Description BLOOD LEFT ARM  Final   Special  Requests BOTTLES DRAWN AEROBIC ONLY 10 CC  Final   Culture   Final           BLOOD CULTURE RECEIVED NO GROWTH TO DATE CULTURE WILL BE HELD FOR 5 DAYS BEFORE ISSUING A FINAL NEGATIVE REPORT Note: Culture results may be compromised due to an excessive volume of blood received in culture bottles. Performed at [facility]    Report Status PENDING  Incomplete  Culture, blood (routine x 2)     Status: None (Preliminary result)   Collection Time: 03/15/15  9:45 AM  Result Value Ref Range Status   Specimen Description BLOOD LEFT WRIST  Final   Special Requests BOTTLES DRAWN AEROBIC ONLY 10 CC  Final   Culture   Final           BLOOD CULTURE RECEIVED NO GROWTH TO DATE CULTURE WILL BE HELD FOR 5 DAYS BEFORE ISSUING A FINAL NEGATIVE REPORT Note: Culture results may be compromised due to an excessive volume of blood received in culture bottles. Performed at [facility]    Report Status PENDING  Incomplete  Body fluid culture     Status: None (Preliminary result)   Collection Time: 03/17/15  2:27 PM  Result Value Ref Range Status   Specimen Description FLUID RIGHT PLEURAL  Final   Special Requests NONE  Final   Gram Stain   Final    RARE WBC PRESENT,BOTH PMN AND MONONUCLEAR NO ORGANISMS SEEN Performed at [facility]    Culture NO GROWTH Performed at [facility]   Final   Report Status PENDING  Incomplete    Radiology Reports Dg Chest 1 View  03/17/2015   CLINICAL DATA:  Right sided thoracentesis, status post removal of 580 cc of yellow fluid. Cough.  EXAM: CHEST  1 VIEW  COMPARISON:  03/14/2015  FINDINGS: No pneumothorax. Mild atelectasis medially at the right lung base and the right perihilar region. Prior blunting of the right costophrenic angle resolved. There is a moderate left pleural effusion silhouetting the left heart border.  Borderline enlargement of the cardiopericardial silhouette.  IMPRESSION: 1. Resolution of prior right pleural effusion, with  some residual medial right-sided atelectasis. Increase in left pleural effusion, currently moderate in size, with associated passive atelectasis.   Electronically Signed   By: Van Clines M.D.   On: 03/17/2015 14:56   Dg Chest 1 View  03/03/2015   CLINICAL DATA:  Post right thoracentesis  EXAM: CHEST  1 VIEW  COMPARISON:  Chest x-ray of 02/21/2015  FINDINGS: Some of the right pleural effusion has been evacuated. No obvious pneumothorax is seen, but on the frontal view there is a fluid level at the right lung base and a small right hydro pneumothorax is suspected. Opacity at the left lung base persists consistent with left effusion, atelectasis, and possibly pneumonia. Heart size is stable.  IMPRESSION: 1. Suspect small right hydro pneumothorax after right thoracentesis. 2. Reduction of right pleural effusion.   Electronically Signed   By: Ivar Drape M.D.   On: 03/03/2015 16:17   Dg Chest 2 View  03/13/2015   CLINICAL DATA:  Cough  EXAM: CHEST  2 VIEW  COMPARISON:  March 03, 2015 chest radiograph and chest CT February 21, 2015  FINDINGS: There is a small pleural effusion on the right. No pneumothorax appreciable. There is mild bibasilar atelectasis. Lungs  are otherwise clear. Heart size and pulmonary vascularity are normal. No adenopathy. There is evidence of sclerosis in several ribs consistent with prior rib trauma. No acute fracture apparent. Nipple shadows are noted bilaterally.  IMPRESSION: Small right pleural effusion. Mild bibasilar atelectasis. No airspace consolidation or edema.   Electronically Signed   By: Lowella Grip III M.D.   On: 03/13/2015 09:20   Dg Chest 2 View  02/21/2015   CLINICAL DATA:  Chest pain and shortness of breath. Negative pregnancy test.  EXAM: CHEST  2 VIEW  COMPARISON:  12/05/2014  FINDINGS: Lungs are adequately inflated and demonstrate small to moderate bilateral pleural effusions right greater than left which are new. Likely associated atelectasis in the lung bases  as basilar infection is also possible person cardiomediastinal silhouette and remainder of the exam is unchanged.  IMPRESSION: Small to moderate bilateral pleural effusions right greater than left. Likely associated bibasilar atelectasis versus infection.   Electronically Signed   By: Marin Olp M.D.   On: 02/21/2015 14:54   Ct Angio Chest W/cm &/or Wo Cm  02/21/2015   CLINICAL DATA:  3 day history of bilateral lower extremity edema, cough, and shortness of breath with intermittent stabbing chest pain radiating to the back.  EXAM: CT ANGIOGRAPHY CHEST WITH CONTRAST  TECHNIQUE: Multidetector CT imaging of the chest was performed using the standard protocol during bolus administration of intravenous contrast. Multiplanar CT image reconstructions and MIPs were obtained to evaluate the vascular anatomy.  CONTRAST:  131mL OMNIPAQUE IOHEXOL 350 MG/ML IV.  COMPARISON:  No prior CT. Two-view chest x-ray earlier same date and 12/05/2014.  FINDINGS: Contrast opacification of the pulmonary arteries is good respiratory motion blurred many of the images of the lung bases. Overall, the study is of good diagnostic quality.  No filling defects within either main pulmonary artery or their branches in either lung to suggest pulmonary embolism. Heart size upper normal. Small pericardial effusion. No visible atherosclerosis involving the thoracic or upper abdominal aorta or their visualized branches. No visible coronary artery atherosclerosis.  Large bilateral pleural effusions including of fluid in the major fissure on the left. Associated dense passive atelectasis in the lower lobes and right middle lobe. No confluent airspace consolidation otherwise. No pulmonary parenchymal nodules or masses. Central airways patent with marked bronchial wall thickening.  Enlarged level 1 and level 2 right axillary lymph nodes, the largest measuring approximately 2.5 x 1.4 cm. No significant mediastinal, hilar or left axillary lymphadenopathy.  Mass involving the lower pole of the left lobe of the thyroid gland measuring approximately 4.0 x 3.1 x 4.2 cm.  Visualized upper abdomen unremarkable for the early arterial phase of enhancement. Focus of accessory splenic tissue posterior to the mid spleen. Bone window images demonstrate a likely benign sclerotic bone island in the T6 vertebral body.  Review of the MIP images confirms the above findings.  IMPRESSION: 1. No evidence of pulmonary embolism. 2. Large bilateral pleural effusions including fluid in the major fissure on the left with associated passive atelectasis in the lower lobes and right middle lobe. 3. Marked central bronchial wall thickening consistent with severe bronchitis and/or asthma. 4. Small pericardial effusion. 5. Enlarged right axillary lymph nodes without evidence of lymphadenopathy elsewhere. Isolated unilateral axillary lymphadenopathy can be seen in breast cancer, so if not recently performed, diagnostic mammography may be helpful in further evaluation. 6. Approximate 4 cm mass involving the lower pole of the left lobe of the thyroid gland. Based on size, evaluation with ultrasound  and likely biopsy is indicated. This follows ACR consensus guidelines: Managing Incidental Thyroid Nodules Detected on Imaging: White Paper of the ACR Incidental Thyroid Findings Committee. J Am Coll Radiol 2015; 12:143-150.   Electronically Signed   By: Evangeline Dakin M.D.   On: 02/21/2015 19:06   US Soft Tissue Head/neck  02/25/2015   CLINICAL DATA:  Thyroid nodule.  EXAM: THYROID ULTRASOUND  TECHNIQUE: Ultrasound examination of the thyroid gland and adjacent soft tissues was performed.  COMPARISON:  Chest CT 02/21/2015  FINDINGS: Right thyroid lobe  Measurements: 5.8 x 1.8 x 1.9 cm. There is a subtle heterogeneous nodule along the medial right thyroid lobe that measures 0.6 x 0.3 x 0.7 cm. There is a heterogeneous nodule in the inferior right thyroid lobe that measures 1.0 x 0.5 x 0.8 cm.  Left  thyroid lobe  Measurements: 7.1 x 2.7 x 2.6 cm. There is a dominant nodule occupying the mid and lower aspect of the left thyroid lobe. This lesion corresponds with the nodule seen on the recent CT. This dominant left thyroid nodule measures 4.7 x 2.5 x 4.1 cm. Nodule is heterogeneous with small cystic areas. Cannot exclude small calcifications in this nodule.  Isthmus  Thickness: 0.4 cm.  No nodules visualized.  Lymphadenopathy  None visualized.  IMPRESSION: Bilateral thyroid nodules. There is a dominant left thyroid nodule that measures up to 4.7 cm. Findings meet consensus criteria for biopsy. Ultrasound-guided fine needle aspiration should be considered, as per the consensus statement: Management of Thyroid Nodules Detected at [place]: Society of Radiologists in [place]. Radiology 2005; [id number].   Electronically Signed   By: Markus Daft M.D.   On: 02/25/2015 16:21   US Thyroid Biopsy  03/10/2015   CLINICAL DATA:  44 year old female with a history of left-sided thyroid nodule. She has been referred for percutaneous biopsy.  EXAM: ULTRASOUND GUIDED NEEDLE ASPIRATE BIOPSY OF THE THYROID GLAND  COMPARISON:  02/25/2015  PROCEDURE: The procedure, risks, benefits, and alternatives were explained to the patient. Questions regarding the procedure were encouraged and answered. The patient understands and consents to the procedure.  Ultrasound survey was performed with images stored and sent to PACs.  The left neck was prepped with Betadine in a sterile fashion, and a sterile drape was applied covering the operative field. A sterile gown and sterile gloves were used for the procedure. Local anesthesia was provided with 1% Lidocaine.  Ultrasound guidance was used to infiltrate the region with 1% lidocaine for local anesthesia. Four separate 25 gauge fine needle biopsy were then acquired of the left thyroid nodule using ultrasound guidance. Images were stored.  Slide preparation was  performed.  Final image was stored after biopsy.  Patient tolerated the procedure well and remained hemodynamically stable throughout.  No complications were encountered and no significant blood loss was encounter  FINDINGS: Ultrasound survey of the left thyroid demonstrates heterogeneous nodule inferiorly.  Images during the case demonstrate needle tip within the nodule on each needle pass.  Final ultrasound image demonstrates no complicating features.  IMPRESSION: Status post ultrasound-guided biopsy of left inferior thyroid nodule. Specimen sent to pathology for complete histopathologic analysis.  Signed,  Dulcy Fanny. Earleen Newport, DO  [facility]  [facility]   Electronically Signed   By: Corrie Mckusick D.O.   On: 03/10/2015 14:44   Dg Chest Port 1 View  03/14/2015   CLINICAL DATA:  Abnormal labs, suspicious for infection.  EXAM: PORTABLE CHEST - 1 VIEW  COMPARISON:  03/12/2015  FINDINGS: There is persistent right pleural fluid or thickening in the lateral base. Mild linear basilar opacities persist and may represent scarring. Lungs are otherwise clear. Hilar, mediastinal and cardiac contours are unremarkable and unchanged. Pulmonary vasculature is normal.  IMPRESSION: Unchanged, would minimal linear basilar opacities and right lateral pleural fluid/thickening.   Electronically Signed   By: Andreas Newport M.D.   On: 03/14/2015 03:09   Dg Knee Complete 4 Views Right  03/05/2015   CLINICAL DATA:  Acute onset of right knee pain and swelling. Initial encounter.  EXAM: RIGHT KNEE - COMPLETE 4+ VIEW  COMPARISON:  None.  FINDINGS: There is no evidence of fracture or dislocation. The joint spaces are preserved. No significant degenerative change is seen; the patellofemoral joint is grossly unremarkable in appearance.  A small knee joint effusion is noted. The visualized soft tissues are otherwise unremarkable.  IMPRESSION: 1. No evidence of fracture or dislocation. 2. Small  knee joint effusion noted.   Electronically Signed   By: Garald Balding M.D.   On: 03/05/2015 22:31   Dg Hand Complete Right  03/05/2015   CLINICAL DATA:  Bilateral hand pain and swelling. Initial encounter.  EXAM: RIGHT HAND - COMPLETE 3+ VIEW  COMPARISON:  None.  FINDINGS: There is no evidence of fracture or dislocation. The joint spaces are preserved. The carpal rows are intact, and demonstrate normal alignment. Mild dorsal soft tissue swelling is noted about the wrist.  IMPRESSION: No evidence of fracture or dislocation.   Electronically Signed   By: Garald Balding M.D.   On: 03/05/2015 22:30   US Thoracentesis Asp Pleural Space W/img Guide  03/17/2015   INDICATION: Symptomatic R sided pleural effusion  EXAM: US THORACENTESIS ASP PLEURAL SPACE W/IMG GUIDE  COMPARISON:  None.  MEDICATIONS: 10 cc 1% lidocaine  COMPLICATIONS: None immediate  TECHNIQUE: Informed written consent was obtained from the patient after a discussion of the risks, benefits and alternatives to treatment. A timeout was performed prior to the initiation of the procedure.  Initial ultrasound scanning demonstrates a right pleural effusion. The lower chest was prepped and draped in the usual sterile fashion. 1% lidocaine was used for local anesthesia.  Under direct ultrasound guidance, a 19 gauge, 7-cm, Yueh catheter was introduced. An ultrasound image was saved for documentation purposes. the thoracentesis was performed. The catheter was removed and a dressing was applied. The patient tolerated the procedure well without immediate post procedural complication. The patient was escorted to have an upright chest radiograph.  FINDINGS: A total of approximately 580 cc of yellow fluid was removed. Requested samples were sent to the laboratory.  IMPRESSION: Successful ultrasound-guided R sided thoracentesis yielding 580 cc of pleural fluid.  Read by:  Lavonia Drafts [facility]   Electronically Signed   By: Sandi Mariscal M.D.   On: 03/17/2015 15:13   US  Thoracentesis Asp Pleural Space W/img Guide  03/03/2015   INDICATION: Dyspnea, bilateral pleural effusions; request is made for diagnostic and therapeutic right thoracentesis.  EXAM: ULTRASOUND GUIDED DIAGNOSTIC AND THERAPEUTIC RIGHT THORACENTESIS  COMPARISON:  None.  MEDICATIONS: None  COMPLICATIONS: None immediate  TECHNIQUE: Informed written consent was obtained from the patient via interpreter after a discussion of the risks, benefits and alternatives to treatment. A timeout was performed prior to the initiation of the procedure.  Initial ultrasound scanning demonstrates a pleural effusion. The lower chest was prepped and draped in the usual sterile fashion. 1% lidocaine was used for local anesthesia.  An ultrasound image  was saved for documentation purposes. A 6 Fr Safe-T-Centesis catheter was introduced. The thoracentesis was performed. The catheter was removed and a dressing was applied. The patient tolerated the procedure well without immediate post procedural complication. The patient was escorted to have an upright chest radiograph.  FINDINGS: A total of approximately 360 cc's of slightly turbid ,yellow fluid was removed. Requested samples were sent to the laboratory.  IMPRESSION: Successful ultrasound-guided diagnostic and therapeutic right sided thoracentesis yielding 360 cc's of pleural fluid.  Read by: Rowe Robert, PA-C   Electronically Signed   By: Markus Daft M.D.   On: 03/03/2015 16:41    CBC  Recent Labs Lab 03/12/15 1325 03/14/15 0239 03/15/15 1430 03/16/15 0745  WBC 4.4 4.3 3.8* 4.2  HGB 10.5* 10.2* 8.3* 8.3*  HCT 32.0* 31.8* 24.9* 25.4*  PLT 624* 522* 434* 423*  MCV 83.6 83.7 82.5 82.7  MCH 27.4 26.8 27.5 27.0  MCHC 32.8 32.1 33.3 32.7  RDW 13.4 13.1 13.2 13.4  LYMPHSABS 0.8 1.1 1.0  --   MONOABS 0.4 0.4 0.3  --   EOSABS 0.1 0.2 0.1  --   BASOSABS 0.0 0.0 0.0  --     Chemistries   Recent Labs Lab 03/12/15 1325 03/14/15 0239 03/15/15 0940 03/16/15 0745  NA 136  137 134* 138  K 4.2 4.7 3.8 3.6  CL 101 105 107 108  CO2 21 21 20 22   GLUCOSE 101* 98 166* 111*  BUN 7 8 <5* <5*  CREATININE 0.53 0.56 0.66 0.55  CALCIUM 8.9 8.8 8.1* 8.1*  AST 69* 61* 41* 37  ALT 65* 58* 28 24  ALKPHOS 93 114 71 68  BILITOT 0.4 1.2 0.5 0.2*   ------------------------------------------------------------------------------------------------------------------ estimated creatinine clearance is 71.7 mL/min (by C-G formula based on Cr of 0.55). ------------------------------------------------------------------------------------------------------------------ No results for input(s): HGBA1C in the last 72 hours. ------------------------------------------------------------------------------------------------------------------  Recent Labs  03/17/15 1730  CHOL 106  TRIG 74   ------------------------------------------------------------------------------------------------------------------ No results for input(s): TSH, T4TOTAL, T3FREE, THYROIDAB in the last 72 hours.  Invalid input(s): FREET3 ------------------------------------------------------------------------------------------------------------------ No results for input(s): VITAMINB12, FOLATE, FERRITIN, TIBC, IRON, RETICCTPCT in the last 72 hours.  Coagulation profile No results for input(s): INR, PROTIME in the last 168 hours.  No results for input(s): DDIMER in the last 72 hours.  Cardiac Enzymes No results for input(s): CKMB, TROPONINI, MYOGLOBIN in the last 168 hours.  Invalid input(s): CK ------------------------------------------------------------------------------------------------------------------ Invalid input(s): POCBNP    Elvis Laufer D.O. on 03/18/2015 at 2:25 PM  Between 7am to 7pm - Pager - [phone]  After 7pm go to [email or web address] - password [email or web address]  And look for the night coverage person covering for me after hours  [facility] Office  [phone]

## 2015-03-18 NOTE — Progress Notes (Addendum)
[place] for Infectious Disease    Subjective: Right knee pain worse  Antibiotics:  Anti-infectives    Start     Dose/Rate Route Frequency Ordered Stop   03/14/15 1445  ceFAZolin (ANCEF) IVPB 2 g/50 mL premix     2 g 100 mL/hr over 30 Minutes Intravenous 3 times per day 03/14/15 1426     03/14/15 1400  piperacillin-tazobactam (ZOSYN) IVPB 3.375 g  Status:  Discontinued     3.375 g 12.5 mL/hr over 240 Minutes Intravenous 3 times per day 03/14/15 1319 03/14/15 1426   03/14/15 0830  piperacillin-tazobactam (ZOSYN) IVPB 3.375 g  Status:  Discontinued     3.375 g 12.5 mL/hr over 240 Minutes Intravenous Every 8 hours 03/14/15 0217 03/14/15 0651   03/14/15 0230  vancomycin (VANCOCIN) IVPB 750 mg/150 ml premix  Status:  Discontinued     750 mg 150 mL/hr over 60 Minutes Intravenous Every 12 hours 03/14/15 0217 03/16/15 1151   03/14/15 0215  piperacillin-tazobactam (ZOSYN) IVPB 3.375 g     3.375 g 100 mL/hr over 30 Minutes Intravenous  Once 03/14/15 0213 03/14/15 0323      Medications: Scheduled Meds: .  ceFAZolin (ANCEF) IV  2 g Intravenous 3 times per day   Continuous Infusions: . sodium chloride 125 mL/hr at 03/18/15 0335   PRN Meds:.acetaminophen **OR** acetaminophen    Objective: Weight change: 1 lb 9.6 oz (0.726 kg)  Intake/Output Summary (Last 24 hours) at 03/18/15 2037 Last data filed at 03/18/15 1700  Gross per 24 hour  Intake   1735 ml  Output      2 ml  Net   1733 ml   Blood pressure 104/56, pulse 95, temperature 100.1 F (37.8 C), temperature source Oral, resp. rate 18, height 5\' 2"  (1.575 m), weight 122 lb 8 oz (55.566 kg), last menstrual period 11/19/2014, SpO2 100 %. Temp:  [98.8 F (37.1 C)-100.1 F (37.8 C)] 100.1 F (37.8 C) (04/06 2015) Pulse Rate:  [88-95] 95 (04/06 2015) Resp:  [18] 18 (04/06 1214) BP: (104-107)/(56-72) 104/56 mmHg (04/06 2015) SpO2:  [100 %] 100 % (04/06 2015) Weight:  [122 lb 8 oz (55.566 kg)] 122 lb 8 oz (55.566 kg)  (04/06 0454)  Physical Exam: General: Alert and awake, oriented x3, not in any acute distress. HEENT: anicteric sclera, pupils reactive to light and accommodation, EOMI CVS regular rate, normal r,  no murmur rubs or gallops Chest: diminished breath sounds at the bases bilaterally  Abdomen: soft nontender, nondistended, normal bowel sounds, Extremities: Both knees and both ankles are tender to palpation R>> left and ankles have some marked edema Skin: no rashes Lymph: few palpable axillary LN on the right but subtle, no cervical la or left sided LA Neuro: nonfocal  CBC: CBC Latest Ref Rng 03/16/2015 03/15/2015 03/14/2015  WBC 4.0 - 10.5 K/uL 4.2 3.8(L) 4.3  Hemoglobin 12.0 - 15.0 g/dL 8.3(L) 8.3(L) 10.2(L)  Hematocrit 36.0 - 46.0 % 25.4(L) 24.9(L) 31.8(L)  Platelets 150 - 400 K/uL 423(H) 434(H) 522(H)       BMET  Recent Labs  03/16/15 0745  NA 138  K 3.6  CL 108  CO2 22  GLUCOSE 111*  BUN <5*  CREATININE 0.55  CALCIUM 8.1*     Liver Panel   Recent Labs  03/16/15 0745  PROT 5.1*  ALBUMIN 1.9*  AST 37  ALT 24  ALKPHOS 68  BILITOT 0.2*       Sedimentation Rate  Recent Labs  03/17/15 1838  ESRSEDRATE 86*   C-Reactive Protein  Recent Labs  03/17/15 1838  CRP 19.8*    Micro Results: Recent Results (from the past 720 hour(s))  Body fluid culture     Status: None   Collection Time: 03/03/15  4:41 PM  Result Value Ref Range Status   Specimen Description PLEURAL  Final   Special Requests PLEURAL  Final   Gram Stain   Final    RARE WBC PRESENT, PREDOMINANTLY PMN NO ORGANISMS SEEN Performed at [facility]    Culture   Final    NO GROWTH 3 DAYS Performed at [facility]    Report Status 03/07/2015 FINAL  Final  AFB culture with smear     Status: None (Preliminary result)   Collection Time: 03/03/15  4:41 PM  Result Value Ref Range Status   Specimen Description PLEURAL  Final   Special Requests NONE  Final   Acid Fast Smear    Final    NO ACID FAST BACILLI SEEN Performed at [facility]    Culture   Final    CULTURE WILL BE EXAMINED FOR 6 WEEKS BEFORE ISSUING A FINAL REPORT Performed at [facility]    Report Status PENDING  Incomplete  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 tuberculosis, Direct CANCELED      Comment: Specimen already disgarded from referring Accession. Will need to recollect patient.  Result canceled by the ancillary    Source (MTBPCR) CANCELED      Comment: Result canceled by the ancillary  Culture, blood (single)     Status: None   Collection Time: 03/12/15  1:25 PM  Result Value Ref Range Status   Culture STAPHYLOCOCCUS SPECIES (COAGULASE NEGATIVE)  Final   Organism ID, Bacteria STAPHYLOCOCCUS SPECIES (COAGULASE NEGATIVE)  Final    Comment: Rifampin and Gentamicin should not be used as single drugs for treatment of Staph infections. This organism DOES NOT demonstrate inducible Clindamycin resistance in vitro. Gram Stain Report Called to,Read Back By and Verified With: DR. Reginia Forts 03/13/15 AT 2300 RIDK       Susceptibility   Staphylococcus species (coagulase negative) -  (no method available)    PENICILLIN 0.25 Resistant     OXACILLIN <=0.25 Sensitive     CEFAZOLIN  Sensitive     GENTAMICIN <=0.5 Sensitive     CIPROFLOXACIN <=0.5 Sensitive     LEVOFLOXACIN <=0.12 Sensitive     TRIMETH/SULFA <=10 Sensitive     VANCOMYCIN 1 Sensitive     CLINDAMYCIN <=0.25 Sensitive     ERYTHROMYCIN >=8 Resistant     RIFAMPIN <=0.5 Sensitive     TETRACYCLINE <=1 Sensitive   Culture, blood (single)     Status: None   Collection Time: 03/12/15  1:25 PM  Result Value Ref Range Status   Organism ID, Bacteria STAPHYLOCOCCUS SPECIES (COAGULASE NEGATIVE)  Final    Comment: Susceptibilities performed on previous culture within the last 5 days. Gram Stain Report Called to,Read Back By and  Verified With: DR. Reginia Forts 03/13/15 AT 2230 [place]   Blood Culture (routine x 2)     Status: None (Preliminary result)   Collection Time: 03/14/15  2:39 AM  Result Value Ref Range Status   Specimen Description BLOOD RIGHT WRIST  Final   Special Requests BOTTLES DRAWN AEROBIC ONLY 4CC  Final   Culture   Final  BLOOD CULTURE RECEIVED NO GROWTH TO DATE CULTURE WILL BE HELD FOR 5 DAYS BEFORE ISSUING A FINAL NEGATIVE REPORT Performed at [facility]    Report Status PENDING  Incomplete  Blood Culture (routine x 2)     Status: None (Preliminary result)   Collection Time: 03/14/15  2:46 AM  Result Value Ref Range Status   Specimen Description BLOOD LEFT ARM  Final   Special Requests BOTTLES DRAWN AEROBIC AND ANAEROBIC 5CC  Final   Culture   Final           BLOOD CULTURE RECEIVED NO GROWTH TO DATE CULTURE WILL BE HELD FOR 5 DAYS BEFORE ISSUING A FINAL NEGATIVE REPORT Performed at [facility]    Report Status PENDING  Incomplete  Urine culture     Status: None   Collection Time: 03/14/15  8:43 AM  Result Value Ref Range Status   Specimen Description URINE, RANDOM  Final   Special Requests NONE  Final   Colony Count   Final    1,000 COLONIES/ML Performed at [facility]    Culture   Final    INSIGNIFICANT GROWTH Performed at [facility]    Report Status 03/16/2015 FINAL  Final  Culture, blood (routine x 2)     Status: None (Preliminary result)   Collection Time: 03/15/15  9:40 AM  Result Value Ref Range Status   Specimen Description BLOOD LEFT ARM  Final   Special Requests BOTTLES DRAWN AEROBIC ONLY 10 CC  Final   Culture   Final           BLOOD CULTURE RECEIVED NO GROWTH TO DATE CULTURE WILL BE HELD FOR 5 DAYS BEFORE ISSUING A FINAL NEGATIVE REPORT Note: Culture results may be compromised due to an excessive volume of blood received in culture bottles. Performed at [facility]    Report Status PENDING  Incomplete  Culture,  blood (routine x 2)     Status: None (Preliminary result)   Collection Time: 03/15/15  9:45 AM  Result Value Ref Range Status   Specimen Description BLOOD LEFT WRIST  Final   Special Requests BOTTLES DRAWN AEROBIC ONLY 10 CC  Final   Culture   Final           BLOOD CULTURE RECEIVED NO GROWTH TO DATE CULTURE WILL BE HELD FOR 5 DAYS BEFORE ISSUING A FINAL NEGATIVE REPORT Note: Culture results may be compromised due to an excessive volume of blood received in culture bottles. Performed at [facility]    Report Status PENDING  Incomplete  Body fluid culture     Status: None (Preliminary result)   Collection Time: 03/17/15  2:27 PM  Result Value Ref Range Status   Specimen Description FLUID RIGHT PLEURAL  Final   Special Requests NONE  Final   Gram Stain   Final    RARE WBC PRESENT,BOTH PMN AND MONONUCLEAR NO ORGANISMS SEEN Performed at [facility]    Culture NO GROWTH Performed at [facility]   Final   Report Status PENDING  Incomplete    Studies/Results: Dg Chest 1 View  03/17/2015   CLINICAL DATA:  Right sided thoracentesis, status post removal of 580 cc of yellow fluid. Cough.  EXAM: CHEST  1 VIEW  COMPARISON:  03/14/2015  FINDINGS: No pneumothorax. Mild atelectasis medially at the right lung base and the right perihilar region. Prior blunting of the right costophrenic angle resolved. There is a moderate left pleural effusion  silhouetting the left heart border.  Borderline enlargement of the cardiopericardial silhouette.  IMPRESSION: 1. Resolution of prior right pleural effusion, with some residual medial right-sided atelectasis. Increase in left pleural effusion, currently moderate in size, with associated passive atelectasis.   Electronically Signed   By: Van Clines M.D.   On: 03/17/2015 14:56   US Thoracentesis Asp Pleural Space W/img Guide  03/17/2015   INDICATION: Symptomatic R sided pleural effusion  EXAM: US THORACENTESIS ASP PLEURAL SPACE W/IMG  GUIDE  COMPARISON:  None.  MEDICATIONS: 10 cc 1% lidocaine  COMPLICATIONS: None immediate  TECHNIQUE: Informed written consent was obtained from the patient after a discussion of the risks, benefits and alternatives to treatment. A timeout was performed prior to the initiation of the procedure.  Initial ultrasound scanning demonstrates a right pleural effusion. The lower chest was prepped and draped in the usual sterile fashion. 1% lidocaine was used for local anesthesia.  Under direct ultrasound guidance, a 19 gauge, 7-cm, Yueh catheter was introduced. An ultrasound image was saved for documentation purposes. the thoracentesis was performed. The catheter was removed and a dressing was applied. The patient tolerated the procedure well without immediate post procedural complication. The patient was escorted to have an upright chest radiograph.  FINDINGS: A total of approximately 580 cc of yellow fluid was removed. Requested samples were sent to the laboratory.  IMPRESSION: Successful ultrasound-guided R sided thoracentesis yielding 580 cc of pleural fluid.  Read by:  Lavonia Drafts [facility]   Electronically Signed   By: Sandi Mariscal M.D.   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 female from [place] who began having fevers, chills and sweats a little over 3 weeks ago. She is also noted migratory joint swelling and pain. He first began in both feet and both knees. The swelling and pain in her feet has improved but now she has swelling in her left wrist. She is also had shortness of breath and was found to have bilateral pleural  effusions on chest x-ray and CT scan. There was some atelectasis in the bases but no underlying infiltrates were noted. She underwent thoracentesis revealing exudative effusions. Gram stain and AFB stain were negative. Routine cultures were negative and AFB cultures are negative to date.Her Quantiferon TB assay was also positive. Pleural fluid cytology showed reactive mesothelial cells. She also underwent a biopsy of her thyroid gland that was benign as well on the 22nd.   She was then seen by her primary care physician in follow-up and found to still be suffering from malaise and to be febrile. Blood cultures were obtained on03/31/2016 in both are growing Methicillin Sensitive regulations negative staphylococcal species.  She continues to have pain in both knees as well as her ankles. She has a dry nonproductive cough.  She has several nonspecific inflammatory markers are elevated including a sedimentation rate above 100. Her rheumatoid factor antibodies are negative as well as her ANA.C3 and C4 were normal  She is currently on cefazolin.  #1 Coagulase negative staphylococcal bacteremia: Strange that it is NOT growing on  repeat cultures here on the 2nd  This may very well been an iatrogenic infection with her several invasive procedures are performed recently including a thoracocentesis and thyroid biopsy. She may also have had intravenous access obtained.  Her 2-D echocardiogram is negative for vegetations.  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  Her positive QuantiFERON Gold in the context of an exudative pleural effusion in a person from a highly endemic area of [place] makes me worry  that she could have tuberculous infection of her pleural space.  She has had repeat thoracocentesis and pleural fluid wbc is improved.    ADENOSINE DEAMINASE, MTB PCR, and if possible IFN gammaAFB culture, Fungal culture, cultures pending  She is not able to make sputum even with one induction by Resp Therapy (Resp Therapy note indicated on 03/16/15 that they would try q 8 but I see no further documentation)   #3 Inguinal LA: agree that excisional lymph node biopsy by CCS (vs IR guided, less high yield) might help [place] figure out what is going on  Would make sure LN sent for PATHOLOGY AND AFB SMEAR AND CULTURE, fungal smear and culture  #4 Knee effusions, ankle effusions: could consider aspirate of right knee for cell count differential, crystals, culture  Dr. Johnnye Sima is covering the service tomorrow through Sunday.    LOS: 4 days   Alcide Evener 03/18/2015, 8:37 PM

## 2015-03-19 ENCOUNTER — Inpatient Hospital Stay (HOSPITAL_COMMUNITY): Payer: 59

## 2015-03-19 ENCOUNTER — Encounter (HOSPITAL_COMMUNITY)
Admission: EM | Disposition: A | Discharge status: Home / Self Care | Payer: Self-pay | Source: Home / Self Care | Attending: Internal Medicine

## 2015-03-19 ENCOUNTER — Other Ambulatory Visit: Payer: 59

## 2015-03-19 ENCOUNTER — Encounter (HOSPITAL_COMMUNITY): Payer: Self-pay | Admitting: Pulmonary Disease

## 2015-03-19 DIAGNOSIS — M1990 Unspecified osteoarthritis, unspecified site: Secondary | ICD-10-CM

## 2015-03-19 DIAGNOSIS — A159 Respiratory tuberculosis unspecified: Secondary | ICD-10-CM | POA: Insufficient documentation

## 2015-03-19 DIAGNOSIS — R600 Localized edema: Secondary | ICD-10-CM

## 2015-03-19 DIAGNOSIS — B957 Other staphylococcus as the cause of diseases classified elsewhere: Secondary | ICD-10-CM

## 2015-03-19 DIAGNOSIS — R591 Generalized enlarged lymph nodes: Secondary | ICD-10-CM

## 2015-03-19 DIAGNOSIS — M25461 Effusion, right knee: Secondary | ICD-10-CM

## 2015-03-19 LAB — BLOOD GAS, ARTERIAL
Acid-Base Excess: 0.5 mmol/L (ref 0.0–2.0)
Bicarbonate: 23.3 mEq/L (ref 20.0–24.0)
Drawn by: 398661
O2 Content: 2 L/min
O2 Saturation: 94 %
Patient temperature: 102.7
TCO2: 24.3 mmol/L (ref 0–100)
pCO2 arterial: 33 mmHg — ABNORMAL LOW (ref 35.0–45.0)
pH, Arterial: 7.473 — ABNORMAL HIGH (ref 7.350–7.450)
pO2, Arterial: 81.8 mmHg (ref 80.0–100.0)

## 2015-03-19 LAB — COMPREHENSIVE METABOLIC PANEL
ALT: 14 U/L (ref 0–35)
AST: 24 U/L (ref 0–37)
Albumin: 1.6 g/dL — ABNORMAL LOW (ref 3.5–5.2)
Alkaline Phosphatase: 63 U/L (ref 39–117)
Anion gap: 9 (ref 5–15)
BILIRUBIN TOTAL: 0.3 mg/dL (ref 0.3–1.2)
BUN: <5 mg/dL — ABNORMAL LOW (ref 6–23)
CHLORIDE: 106 mmol/L (ref 96–112)
CO2: 21 mmol/L (ref 19–32)
CREATININE: 0.5 mg/dL (ref 0.50–1.10)
Calcium: 7.9 mg/dL — ABNORMAL LOW (ref 8.4–10.5)
GFR calc Af Amer: >90 mL/min (ref >90)
Glucose, Bld: 124 mg/dL — ABNORMAL HIGH (ref 70–99)
Potassium: 3.7 mmol/L (ref 3.5–5.1)
Sodium: 136 mmol/L (ref 135–145)
Total Protein: 5.3 g/dL — ABNORMAL LOW (ref 6.0–8.3)

## 2015-03-19 LAB — CBC
HEMATOCRIT: 24.5 % — AB (ref 36.0–46.0)
HEMOGLOBIN: 8 g/dL — AB (ref 12.0–15.0)
MCH: 27.3 pg (ref 26.0–34.0)
MCHC: 32.7 g/dL (ref 30.0–36.0)
MCV: 83.6 fL (ref 78.0–100.0)
Platelets: 417 10*3/uL — ABNORMAL HIGH (ref 150–400)
RBC: 2.93 MIL/uL — AB (ref 3.87–5.11)
RDW: 13.8 % (ref 11.5–15.5)
WBC: 4.2 10*3/uL (ref 4.0–10.5)

## 2015-03-19 LAB — SYNOVIAL CELL COUNT + DIFF, W/ CRYSTALS
Crystals, Fluid: NONE SEEN
Lymphocytes-Synovial Fld: 2 % (ref 0–20)
Monocyte-Macrophage-Synovial Fluid: 14 % — ABNORMAL LOW (ref 50–90)
Neutrophil, Synovial: 84 % — ABNORMAL HIGH (ref 0–25)
WBC, Synovial: 8477 /mm3 — ABNORMAL HIGH (ref 0–200)

## 2015-03-19 LAB — ANGIOTENSIN CONVERTING ENZYME: ANGIOTENSIN-CONVERTING ENZYME: 34 U/L (ref 14–82)

## 2015-03-19 SURGERY — ECHOCARDIOGRAM, TRANSESOPHAGEAL
Anesthesia: Moderate Sedation

## 2015-03-19 MED ORDER — SODIUM CHLORIDE 0.9 % IV BOLUS (SEPSIS)
500.0000 mL | Freq: Once | INTRAVENOUS | Status: AC
  Administered 2015-03-19: 500 mL via INTRAVENOUS

## 2015-03-19 MED ORDER — IBUPROFEN 200 MG PO TABS
400.0000 mg | ORAL_TABLET | Freq: Once | ORAL | Status: AC
  Administered 2015-03-20: 400 mg via ORAL
  Filled 2015-03-19: qty 2

## 2015-03-19 NOTE — Progress Notes (Signed)
INFECTIOUS DISEASE PROGRESS NOTE  ID: Nichole Perry is a 44 y.o. female with  Principal Problem:   Coagulase negative Staphylococcus bacteremia Active Problems:   Goiter   Bilateral pleural effusion   Arthralgia   Amenorrhea   LAD (lymphadenopathy), axillary   Bilateral edema of lower extremity   Fever   Sepsis   Transaminasemia   Latent tuberculosis by blood test   Bacteremia   Knee effusion   Pleural effusion   Tuberculosis  Subjective: depressed  Abtx:  Anti-infectives    Start     Dose/Rate Route Frequency Ordered Stop   03/14/15 1445  ceFAZolin (ANCEF) IVPB 2 g/50 mL premix     2 g 100 mL/hr over 30 Minutes Intravenous 3 times per day 03/14/15 1426     03/14/15 1400  piperacillin-tazobactam (ZOSYN) IVPB 3.375 g  Status:  Discontinued     3.375 g 12.5 mL/hr over 240 Minutes Intravenous 3 times per day 03/14/15 1319 03/14/15 1426   03/14/15 0830  piperacillin-tazobactam (ZOSYN) IVPB 3.375 g  Status:  Discontinued     3.375 g 12.5 mL/hr over 240 Minutes Intravenous Every 8 hours 03/14/15 0217 03/14/15 0651   03/14/15 0230  vancomycin (VANCOCIN) IVPB 750 mg/150 ml premix  Status:  Discontinued     750 mg 150 mL/hr over 60 Minutes Intravenous Every 12 hours 03/14/15 0217 03/16/15 1151   03/14/15 0215  piperacillin-tazobactam (ZOSYN) IVPB 3.375 g     3.375 g 100 mL/hr over 30 Minutes Intravenous  Once 03/14/15 0213 03/14/15 0323      Medications:  Scheduled: .  ceFAZolin (ANCEF) IV  2 g Intravenous 3 times per day    Objective: Vital signs in last 24 hours: Temp:  [99.7 F (37.6 C)-100.1 F (37.8 C)] 100 F (37.8 C) (04/07 0502) Pulse Rate:  [91-98] 98 (04/07 0502) BP: (104-105)/(56-71) 105/71 mmHg (04/07 0502) SpO2:  [96 %-100 %] 96 % (04/07 0502) Weight:  [56.609 kg (124 lb 12.8 oz)] 56.609 kg (124 lb 12.8 oz) (04/07 0502)   General appearance: alert and no distress Resp: clear to auscultation bilaterally Cardio: regular rate and rhythm GI:  normal findings: bowel sounds normal and soft, non-tender  Lab Results  Recent Labs  03/19/15 0612  WBC 4.2  HGB 8.0*  HCT 24.5*  NA 136  K 3.7  CL 106  CO2 21  BUN <5*  CREATININE 0.50   Liver Panel  Recent Labs  03/19/15 0612  PROT 5.3*  ALBUMIN 1.6*  AST 24  ALT 14  ALKPHOS 63  BILITOT 0.3   Sedimentation Rate  Recent Labs  03/17/15 1838  ESRSEDRATE 86*   C-Reactive Protein  Recent Labs  03/17/15 1838  CRP 19.8*    Microbiology: Recent Results (from the past 240 hour(s))  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 tuberculosis, Direct CANCELED      Comment: Specimen already disgarded from referring [facility]. Will need to recollect patient.  Result canceled by the ancillary    Source ([facility]BPCR) CANCELED      Comment: Result canceled by the ancillary  Culture, blood (single)     Status: None   Collection Time: 03/12/15  1:25 PM  Result Value Ref Range Status   Culture STAPHYLOCOCCUS SPECIES (COAGULASE NEGATIVE)  Final   Organism ID, Bacteria STAPHYLOCOCCUS SPECIES (COAGULASE NEGATIVE)  Final    Comment: Rifampin and Gentamicin  should not be used as single drugs for treatment of Staph infections. This organism DOES NOT demonstrate inducible Clindamycin resistance in vitro. Gram Stain Report Called to,Read Back By and Verified With: DR. Reginia Forts 03/13/15 AT 2300 RIDK       Susceptibility   Staphylococcus species (coagulase negative) -  (no method available)    PENICILLIN 0.25 Resistant     OXACILLIN <=0.25 Sensitive     CEFAZOLIN  Sensitive     GENTAMICIN <=0.5 Sensitive     CIPROFLOXACIN <=0.5 Sensitive     LEVOFLOXACIN <=0.12 Sensitive     TRIMETH/SULFA <=10 Sensitive     VANCOMYCIN 1 Sensitive     CLINDAMYCIN <=0.25 Sensitive     ERYTHROMYCIN >=8 Resistant     RIFAMPIN <=0.5 Sensitive     TETRACYCLINE <=1 Sensitive   Culture, blood (single)     Status:  None   Collection Time: 03/12/15  1:25 PM  Result Value Ref Range Status   Organism ID, Bacteria STAPHYLOCOCCUS SPECIES (COAGULASE NEGATIVE)  Final    Comment: Susceptibilities performed on previous culture within the last 5 days. Gram Stain Report Called to,Read Back By and Verified With: DR. Reginia Forts 03/13/15 AT 2230 [place]   Blood Culture (routine x 2)     Status: None (Preliminary result)   Collection Time: 03/14/15  2:39 AM  Result Value Ref Range Status   Specimen Description BLOOD RIGHT WRIST  Final   Special Requests BOTTLES DRAWN AEROBIC ONLY 4CC  Final   Culture   Final           BLOOD CULTURE RECEIVED NO GROWTH TO DATE CULTURE WILL BE HELD FOR 5 DAYS BEFORE ISSUING A FINAL NEGATIVE REPORT Performed at [facility]    Report Status PENDING  Incomplete  Blood Culture (routine x 2)     Status: None (Preliminary result)   Collection Time: 03/14/15  2:46 AM  Result Value Ref Range Status   Specimen Description BLOOD LEFT ARM  Final   Special Requests BOTTLES DRAWN AEROBIC AND ANAEROBIC 5CC  Final   Culture   Final           BLOOD CULTURE RECEIVED NO GROWTH TO DATE CULTURE WILL BE HELD FOR 5 DAYS BEFORE ISSUING A FINAL NEGATIVE REPORT Performed at [facility]    Report Status PENDING  Incomplete  Urine culture     Status: None   Collection Time: 03/14/15  8:43 AM  Result Value Ref Range Status   Specimen Description URINE, RANDOM  Final   Special Requests NONE  Final   Colony Count   Final    1,000 COLONIES/ML Performed at [facility]    Culture   Final    INSIGNIFICANT GROWTH Performed at [facility]    Report Status 03/16/2015 FINAL  Final  Culture, blood (routine x 2)     Status: None (Preliminary result)   Collection Time: 03/15/15  9:40 AM  Result Value Ref Range Status   Specimen Description BLOOD LEFT ARM  Final   Special Requests BOTTLES DRAWN AEROBIC ONLY 10 CC  Final   Culture   Final           BLOOD CULTURE  RECEIVED NO GROWTH TO DATE CULTURE WILL BE HELD FOR 5 DAYS BEFORE ISSUING A FINAL NEGATIVE REPORT Note: Culture results may be compromised due to an excessive volume of blood received in culture bottles. Performed at [facility] PENDING  Incomplete  Culture, blood (routine x 2)     Status: None (Preliminary result)   Collection Time: 03/15/15  9:45 AM  Result Value Ref Range Status   Specimen Description BLOOD LEFT WRIST  Final   Special Requests BOTTLES DRAWN AEROBIC ONLY 10 CC  Final   Culture   Final           BLOOD CULTURE RECEIVED NO GROWTH TO DATE CULTURE WILL BE HELD FOR 5 DAYS BEFORE ISSUING A FINAL NEGATIVE REPORT Note: Culture results may be compromised due to an excessive volume of blood received in culture bottles. Performed at [facility]    Report Status PENDING  Incomplete  Body fluid culture     Status: None (Preliminary result)   Collection Time: 03/17/15  2:27 PM  Result Value Ref Range Status   Specimen Description FLUID RIGHT PLEURAL  Final   Special Requests NONE  Final   Gram Stain   Final    RARE WBC PRESENT,BOTH PMN AND MONONUCLEAR NO ORGANISMS SEEN Performed at [facility]    Culture   Final    NO GROWTH 1 DAY Performed at [facility]    Report Status PENDING  Incomplete    Studies/Results: Dg Chest 1 View  03/17/2015   CLINICAL DATA:  Right sided thoracentesis, status post removal of 580 cc of yellow fluid. Cough.  EXAM: CHEST  1 VIEW  COMPARISON:  03/14/2015  FINDINGS: No pneumothorax. Mild atelectasis medially at the right lung base and the right perihilar region. Prior blunting of the right costophrenic angle resolved. There is a moderate left pleural effusion silhouetting the left heart border.  Borderline enlargement of the cardiopericardial silhouette.  IMPRESSION: 1. Resolution of prior right pleural effusion, with some residual medial right-sided atelectasis. Increase in left pleural effusion,  currently moderate in size, with associated passive atelectasis.   Electronically Signed   By: Van Clines M.D.   On: 03/17/2015 14:56   US Thoracentesis Asp Pleural Space W/img Guide  03/17/2015   INDICATION: Symptomatic R sided pleural effusion  EXAM: US THORACENTESIS ASP PLEURAL SPACE W/IMG GUIDE  COMPARISON:  None.  MEDICATIONS: 10 cc 1% lidocaine  COMPLICATIONS: None immediate  TECHNIQUE: Informed written consent was obtained from the patient after a discussion of the risks, benefits and alternatives to treatment. A timeout was performed prior to the initiation of the procedure.  Initial ultrasound scanning demonstrates a right pleural effusion. The lower chest was prepped and draped in the usual sterile fashion. 1% lidocaine was used for local anesthesia.  Under direct ultrasound guidance, a 19 gauge, 7-cm, Yueh catheter was introduced. An ultrasound image was saved for documentation purposes. the thoracentesis was performed. The catheter was removed and a dressing was applied. The patient tolerated the procedure well without immediate post procedural complication. The patient was escorted to have an upright chest radiograph.  FINDINGS: A total of approximately 580 cc of yellow fluid was removed. Requested samples were sent to the laboratory.  IMPRESSION: Successful ultrasound-guided R sided thoracentesis yielding 580 cc of pleural fluid.  Read by:  Lavonia Drafts [facility]   Electronically Signed   By: Sandi Mariscal M.D.   On: 03/17/2015 15:13     Assessment/Plan: FUO CNS bacteremia- MSSE LAN  Arthritis Quantiferon + [place]  Total days of antibiotics: 5 ancef  LN Bx pending (in AM). Spoke with PA, asked that this be sent for AFB cx (please!) Will start her TB rx after LN Bx Await her  pleural fluid AFB studies as well.   Await her path and her Cx. I do not see AFB Cx's. Will clarify with lab.  Continue to watch fever curve Plan for 14 days of ancef         Bobby Rumpf Infectious  Diseases (pager) [phone] [email or web address] 03/19/2015, 1:21 PM  LOS: 5 days

## 2015-03-19 NOTE — Progress Notes (Signed)
Knee fluid is c/w arthritis flare up.  Will follow cultures.    Azucena Cecil, MD [place] 5:05 PM

## 2015-03-19 NOTE — Progress Notes (Signed)
[facility]                                                                              Patient Demographics  Nichole Perry, is a 44 y.o. female, DOB - 08/03/1971, NFA:[id number]  Admit date - 03/14/2015   Admitting Physician Lavina Hamman, MD  Outpatient Primary MD for the patient is Maximino Greenland, MD  LOS - 5   Chief Complaint  Patient presents with  . Abnormal Lab      Brief history 44 year old female with a history of goiter and anemia presented to the hospital when she was called by her primary care provider with positive blood cultures. The patient has not been feeling well for nearly 3 weeks. The patient initially presented to her primary care provider because of bilateral knee pain and leg swelling on 02/21/2015. She was started on furosemide 20 mg daily. Chest x-ray at that time revealed small to moderate bilateral pleural effusions. CT angio of the chest was performed on 02/21/2015 which was negative for pulmonary embolus but revealed bilateral pleural effusions and central bronchial wall thickening and enlarged right axillary lymph node. She underwent thoracocentesis on 03/03/2015. Fluid chemistries revealed an exudative pleural effusion with WBC 3000 (64% monos, 28% PMN, 8% eosinophils). Routine Fluid culture and AFB culture are negative at this time. 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 The patient followed up on 03/07/2015, and an autoimmune workup was begun which was negative. Again, the patient represented to the primary care provider office on 03/12/2015 with 2 day history of fever up to 101.54F. Blood cultures were obtained at that time are growing CoNS. Notably, the patient also was noted to have a right thyroid nodule which has been biopsied on 03/10/2015. The biopsy revealed benign follicular nodule. Since admission, the patient has continued to have fevers. Workup continues in progress.  Assessment & Plan   Sepsis -Present at the time of admission -Secondary to  bacteremia? -Lactic acid 2.22 -Continue IV cefazolin -flu is neg -Tmax 100F in past 24hr -continue IVF  Bacteremia-- Coag negative Staph  -unclear presently if true bacteremia vs contaminant as 4/2 repeat blood cultures remain negative to date (had not yet started antibiotics) -may be partly contributing to clinical picture, but doubt is the entire answer -ID following -Echocardiogram: EF 60%, no gross vegetation -follow surveillance blood cultures--neg to date -TEE cannot be done at this time as patient is on airborne precautions -Continue cefazolin  QuantiFeron Positive/Exudative pleural effusion -03/17/15 repeat thoracocentesis WBC 797 (75% lymphs), glucose 150-- MTB PCR, ADA, glucose, cytology/ culture show no growth -given pt's clinical and epidemiologic hx, will still need to r/o pulmonary TB in setting of lymphocytic effusion -airborne isolation--await sputum although pt is barely coughing and unable to induce sputum -Spoke with Pulm regarding possible VATS (pleural bx)- however no longer available; Spoke with [facility], no longer done there either, may need CT surg -Will ask surgery for LN biopsy -Spoke with ID (quantiferon + in March 2016), is it possible to just treat TB?    Transaminasemia -Resolved, secondary to sepsis -No abdominal pain, no vomiting -Hepatitis B surface antigen, hepatitis C antibody--negative -HIV antibody is negative  Lower extremity edema and pain -Venous duplex  r/o DVT--neg  Bilateral pleural effusion -03/03/2015 thoracocentesis--exudative -WBC 3000 (64% monos, 28% PMN, 8% eosinophils)  Arthralgias and myalgias -Autoimmune workup has been negative including C3, C4, anti-CCP, ANA, RF, ENA, anti-dsDNA, Anti-SmAb -improving with standard antibiotics -Continues to have swelling in upper ext and knees -Consulted orthopedics for possible aspiration   Code Status: Full  Family Communication: Husband at bedside  Disposition Plan:  Admitted  Time Spent in minutes   30 minutes  Procedures  Echocardiogram  Consults   Pulmonology Interventional radiology Infectious disease Orthopedic surgery General surgery  DVT Prophylaxis  SCDs  Lab Results  Component Value Date   PLT 417* 03/19/2015    Medications  Scheduled Meds: .  ceFAZolin (ANCEF) IV  2 g Intravenous 3 times per day   Continuous Infusions: . sodium chloride 125 mL/hr at 03/19/15 0730   PRN Meds:.acetaminophen **OR** acetaminophen  Antibiotics    Anti-infectives    Start     Dose/Rate Route Frequency Ordered Stop   03/14/15 1445  ceFAZolin (ANCEF) IVPB 2 g/50 mL premix     2 g 100 mL/hr over 30 Minutes Intravenous 3 times per day 03/14/15 1426     03/14/15 1400  piperacillin-tazobactam (ZOSYN) IVPB 3.375 g  Status:  Discontinued     3.375 g 12.5 mL/hr over 240 Minutes Intravenous 3 times per day 03/14/15 1319 03/14/15 1426   03/14/15 0830  piperacillin-tazobactam (ZOSYN) IVPB 3.375 g  Status:  Discontinued     3.375 g 12.5 mL/hr over 240 Minutes Intravenous Every 8 hours 03/14/15 0217 03/14/15 0651   03/14/15 0230  vancomycin (VANCOCIN) IVPB 750 mg/150 ml premix  Status:  Discontinued     750 mg 150 mL/hr over 60 Minutes Intravenous Every 12 hours 03/14/15 0217 03/16/15 1151   03/14/15 0215  piperacillin-tazobactam (ZOSYN) IVPB 3.375 g     3.375 g 100 mL/hr over 30 Minutes Intravenous  Once 03/14/15 0213 03/14/15 0323        Subjective:   [name] seen and examined today.  Patient continues to complain of pain and swelling in her knees, denies other joint involvment.   Denies chest pain or shortness of breath, or cough, at this time.   Objective:   Filed Vitals:   03/18/15 1214 03/18/15 2015 03/18/15 2255 03/19/15 0502  BP: 107/62 104/56  105/71  Pulse: 89 95 91 98  Temp: 99.5 F (37.5 C) 100.1 F (37.8 C) 99.7 F (37.6 C) 100 F (37.8 C)  TempSrc:  Oral Oral Oral  Resp: 18     Height:      Weight:    56.609 kg  (124 lb 12.8 oz)  SpO2: 100% 100% 100% 96%    Wt Readings from Last 3 Encounters:  03/19/15 56.609 kg (124 lb 12.8 oz)  03/12/15 52.527 kg (115 lb 12.8 oz)  03/05/15 53.524 kg (118 lb)     Intake/Output Summary (Last 24 hours) at 03/19/15 1110 Last data filed at 03/19/15 0730  Gross per 24 hour  Intake 4317.5 ml  Output      1 ml  Net 4316.5 ml    Exam  General: Well developed, well nourished  Cardiovascular: S1 S2 auscultated,RRR, no murmus  Respiratory: Good air movement, no wheezing or rhonichi  Abdomen: Soft, nontender, nondistended, + bowel sounds  Extremities: Trace ankle edema B/L, edema in Upper ext B/L.  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 tuberculosis, Direct CANCELED      Comment: Specimen already disgarded from referring [facility]. Will need to recollect patient.  Result canceled by the ancillary    Source (MTBPCR) CANCELED      Comment: Result canceled by the ancillary  Culture, blood (single)     Status: None   Collection Time: 03/12/15  1:25 PM  Result Value Ref Range Status   Culture STAPHYLOCOCCUS SPECIES (COAGULASE NEGATIVE)  Final   Organism ID, Bacteria STAPHYLOCOCCUS SPECIES (COAGULASE NEGATIVE)  Final    Comment: Rifampin and Gentamicin should not be used as single drugs for treatment of Staph infections. This organism DOES NOT demonstrate inducible Clindamycin resistance in vitro. Gram Stain Report Called to,Read Back By and Verified With: DR. Reginia Forts 03/13/15 AT 2300 RIDK       Susceptibility   Staphylococcus species (coagulase negative) -  (no method available)    PENICILLIN 0.25 Resistant     OXACILLIN <=0.25 Sensitive     CEFAZOLIN  Sensitive     GENTAMICIN <=0.5 Sensitive     CIPROFLOXACIN <=0.5 Sensitive      LEVOFLOXACIN <=0.12 Sensitive     TRIMETH/SULFA <=10 Sensitive     VANCOMYCIN 1 Sensitive     CLINDAMYCIN <=0.25 Sensitive     ERYTHROMYCIN >=8 Resistant     RIFAMPIN <=0.5 Sensitive     TETRACYCLINE <=1 Sensitive   Culture, blood (single)     Status: None   Collection Time: 03/12/15  1:25 PM  Result Value Ref Range Status   Organism ID, Bacteria STAPHYLOCOCCUS SPECIES (COAGULASE NEGATIVE)  Final    Comment: Susceptibilities performed on previous culture within the last 5 days. Gram Stain Report Called to,Read Back By and Verified With: DR. Reginia Forts 03/13/15 AT 2230 [place]   Blood Culture (routine x 2)     Status: None (Preliminary result)   Collection Time: 03/14/15  2:39 AM  Result Value Ref Range Status   Specimen Description BLOOD RIGHT WRIST  Final   Special Requests BOTTLES DRAWN AEROBIC ONLY 4CC  Final   Culture   Final           BLOOD CULTURE RECEIVED NO GROWTH TO DATE CULTURE WILL BE HELD FOR 5 DAYS BEFORE ISSUING A FINAL NEGATIVE REPORT Performed at [facility]    Report Status PENDING  Incomplete  Blood Culture (routine x 2)     Status: None (Preliminary result)   Collection Time: 03/14/15  2:46 AM  Result Value Ref Range Status   Specimen Description BLOOD LEFT ARM  Final   Special Requests BOTTLES DRAWN AEROBIC AND ANAEROBIC 5CC  Final   Culture   Final           BLOOD CULTURE RECEIVED NO GROWTH TO DATE CULTURE WILL BE HELD FOR 5 DAYS BEFORE ISSUING A FINAL NEGATIVE REPORT Performed at [facility]    Report Status PENDING  Incomplete  Urine culture     Status: None   Collection Time: 03/14/15  8:43 AM  Result Value Ref Range Status   Specimen Description URINE, RANDOM  Final   Special Requests NONE  Final   Colony Count   Final    1,000 COLONIES/ML Performed at [facility]    Culture   Final    INSIGNIFICANT GROWTH Performed at [facility]    Report  Status 03/16/2015 FINAL  Final  Culture, blood (routine x 2)      Status: None (Preliminary result)   Collection Time: 03/15/15  9:40 AM  Result Value Ref Range Status   Specimen Description BLOOD LEFT ARM  Final   Special Requests BOTTLES DRAWN AEROBIC ONLY 10 CC  Final   Culture   Final           BLOOD CULTURE RECEIVED NO GROWTH TO DATE CULTURE WILL BE HELD FOR 5 DAYS BEFORE ISSUING A FINAL NEGATIVE REPORT Note: Culture results may be compromised due to an excessive volume of blood received in culture bottles. Performed at [facility]    Report Status PENDING  Incomplete  Culture, blood (routine x 2)     Status: None (Preliminary result)   Collection Time: 03/15/15  9:45 AM  Result Value Ref Range Status   Specimen Description BLOOD LEFT WRIST  Final   Special Requests BOTTLES DRAWN AEROBIC ONLY 10 CC  Final   Culture   Final           BLOOD CULTURE RECEIVED NO GROWTH TO DATE CULTURE WILL BE HELD FOR 5 DAYS BEFORE ISSUING A FINAL NEGATIVE REPORT Note: Culture results may be compromised due to an excessive volume of blood received in culture bottles. Performed at [facility]    Report Status PENDING  Incomplete  Body fluid culture     Status: None (Preliminary result)   Collection Time: 03/17/15  2:27 PM  Result Value Ref Range Status   Specimen Description FLUID RIGHT PLEURAL  Final   Special Requests NONE  Final   Gram Stain   Final    RARE WBC PRESENT,BOTH PMN AND MONONUCLEAR NO ORGANISMS SEEN Performed at [facility]    Culture NO GROWTH Performed at [facility]   Final   Report Status PENDING  Incomplete    Radiology Reports Dg Chest 1 View  03/17/2015   CLINICAL DATA:  Right sided thoracentesis, status post removal of 580 cc of yellow fluid. Cough.  EXAM: CHEST  1 VIEW  COMPARISON:  03/14/2015  FINDINGS: No pneumothorax. Mild atelectasis medially at the right lung base and the right perihilar region. Prior blunting of the right costophrenic angle resolved. There is a moderate left pleural effusion  silhouetting the left heart border.  Borderline enlargement of the cardiopericardial silhouette.  IMPRESSION: 1. Resolution of prior right pleural effusion, with some residual medial right-sided atelectasis. Increase in left pleural effusion, currently moderate in size, with associated passive atelectasis.   Electronically Signed   By: Van Clines M.D.   On: 03/17/2015 14:56   Dg Chest 1 View  03/03/2015   CLINICAL DATA:  Post right thoracentesis  EXAM: CHEST  1 VIEW  COMPARISON:  Chest x-ray of 02/21/2015  FINDINGS: Some of the right pleural effusion has been evacuated. No obvious pneumothorax is seen, but on the frontal view there is a fluid level at the right lung base and a small right hydro pneumothorax is suspected. Opacity at the left lung base persists consistent with left effusion, atelectasis, and possibly pneumonia. Heart size is stable.  IMPRESSION: 1. Suspect small right hydro pneumothorax after right thoracentesis. 2. Reduction of right pleural effusion.   Electronically Signed   By: Ivar Drape M.D.   On: 03/03/2015 16:17   Dg Chest 2 View  03/13/2015   CLINICAL DATA:  Cough  EXAM: CHEST  2 VIEW  COMPARISON:  March 03, 2015 chest radiograph and  chest CT February 21, 2015  FINDINGS: There is a small pleural effusion on the right. No pneumothorax appreciable. There is mild bibasilar atelectasis. Lungs are otherwise clear. Heart size and pulmonary vascularity are normal. No adenopathy. There is evidence of sclerosis in several ribs consistent with prior rib trauma. No acute fracture apparent. Nipple shadows are noted bilaterally.  IMPRESSION: Small right pleural effusion. Mild bibasilar atelectasis. No airspace consolidation or edema.   Electronically Signed   By: Lowella Grip III M.D.   On: 03/13/2015 09:20   Dg Chest 2 View  02/21/2015   CLINICAL DATA:  Chest pain and shortness of breath. Negative pregnancy test.  EXAM: CHEST  2 VIEW  COMPARISON:  12/05/2014  FINDINGS: Lungs are  adequately inflated and demonstrate small to moderate bilateral pleural effusions right greater than left which are new. Likely associated atelectasis in the lung bases as basilar infection is also possible person cardiomediastinal silhouette and remainder of the exam is unchanged.  IMPRESSION: Small to moderate bilateral pleural effusions right greater than left. Likely associated bibasilar atelectasis versus infection.   Electronically Signed   By: Marin Olp M.D.   On: 02/21/2015 14:54   Ct Angio Chest W/cm &/or Wo Cm  02/21/2015   CLINICAL DATA:  3 day history of bilateral lower extremity edema, cough, and shortness of breath with intermittent stabbing chest pain radiating to the back.  EXAM: CT ANGIOGRAPHY CHEST WITH CONTRAST  TECHNIQUE: Multidetector CT imaging of the chest was performed using the standard protocol during bolus administration of intravenous contrast. Multiplanar CT image reconstructions and MIPs were obtained to evaluate the vascular anatomy.  CONTRAST:  144mL OMNIPAQUE IOHEXOL 350 MG/ML IV.  COMPARISON:  No prior CT. Two-view chest x-ray earlier same date and 12/05/2014.  FINDINGS: Contrast opacification of the pulmonary arteries is good respiratory motion blurred many of the images of the lung bases. Overall, the study is of good diagnostic quality.  No filling defects within either main pulmonary artery or their branches in either lung to suggest pulmonary embolism. Heart size upper normal. Small pericardial effusion. No visible atherosclerosis involving the thoracic or upper abdominal aorta or their visualized branches. No visible coronary artery atherosclerosis.  Large bilateral pleural effusions including of fluid in the major fissure on the left. Associated dense passive atelectasis in the lower lobes and right middle lobe. No confluent airspace consolidation otherwise. No pulmonary parenchymal nodules or masses. Central airways patent with marked bronchial wall thickening.   Enlarged level 1 and level 2 right axillary lymph nodes, the largest measuring approximately 2.5 x 1.4 cm. No significant mediastinal, hilar or left axillary lymphadenopathy. Mass involving the lower pole of the left lobe of the thyroid gland measuring approximately 4.0 x 3.1 x 4.2 cm.  Visualized upper abdomen unremarkable for the early arterial phase of enhancement. Focus of accessory splenic tissue posterior to the mid spleen. Bone window images demonstrate a likely benign sclerotic bone island in the T6 vertebral body.  Review of the MIP images confirms the above findings.  IMPRESSION: 1. No evidence of pulmonary embolism. 2. Large bilateral pleural effusions including fluid in the major fissure on the left with associated passive atelectasis in the lower lobes and right middle lobe. 3. Marked central bronchial wall thickening consistent with severe bronchitis and/or asthma. 4. Small pericardial effusion. 5. Enlarged right axillary lymph nodes without evidence of lymphadenopathy elsewhere. Isolated unilateral axillary lymphadenopathy can be seen in breast cancer, so if not recently performed, diagnostic mammography may be helpful in  further evaluation. 6. Approximate 4 cm mass involving the lower pole of the left lobe of the thyroid gland. Based on size, evaluation with ultrasound and likely biopsy is indicated. This follows ACR consensus guidelines: Managing Incidental Thyroid Nodules Detected on Imaging: White Paper of the ACR Incidental Thyroid Findings Committee. J Am Coll Radiol 2015; 12:143-150.   Electronically Signed   By: Evangeline Dakin M.D.   On: 02/21/2015 19:06   US Soft Tissue Head/neck  02/25/2015   CLINICAL DATA:  Thyroid nodule.  EXAM: THYROID ULTRASOUND  TECHNIQUE: Ultrasound examination of the thyroid gland and adjacent soft tissues was performed.  COMPARISON:  Chest CT 02/21/2015  FINDINGS: Right thyroid lobe  Measurements: 5.8 x 1.8 x 1.9 cm. There is a subtle heterogeneous nodule along  the medial right thyroid lobe that measures 0.6 x 0.3 x 0.7 cm. There is a heterogeneous nodule in the inferior right thyroid lobe that measures 1.0 x 0.5 x 0.8 cm.  Left thyroid lobe  Measurements: 7.1 x 2.7 x 2.6 cm. There is a dominant nodule occupying the mid and lower aspect of the left thyroid lobe. This lesion corresponds with the nodule seen on the recent CT. This dominant left thyroid nodule measures 4.7 x 2.5 x 4.1 cm. Nodule is heterogeneous with small cystic areas. Cannot exclude small calcifications in this nodule.  Isthmus  Thickness: 0.4 cm.  No nodules visualized.  Lymphadenopathy  None visualized.  IMPRESSION: Bilateral thyroid nodules. There is a dominant left thyroid nodule that measures up to 4.7 cm. Findings meet consensus criteria for biopsy. Ultrasound-guided fine needle aspiration should be considered, as per the consensus statement: Management of Thyroid Nodules Detected at [place]: Society of Radiologists in [place]. Radiology 2005; [id number].   Electronically Signed   By: Markus Daft M.D.   On: 02/25/2015 16:21   US Thyroid Biopsy  03/10/2015   CLINICAL DATA:  44 year old female with a history of left-sided thyroid nodule. She has been referred for percutaneous biopsy.  EXAM: ULTRASOUND GUIDED NEEDLE ASPIRATE BIOPSY OF THE THYROID GLAND  COMPARISON:  02/25/2015  PROCEDURE: The procedure, risks, benefits, and alternatives were explained to the patient. Questions regarding the procedure were encouraged and answered. The patient understands and consents to the procedure.  Ultrasound survey was performed with images stored and sent to PACs.  The left neck was prepped with Betadine in a sterile fashion, and a sterile drape was applied covering the operative field. A sterile gown and sterile gloves were used for the procedure. Local anesthesia was provided with 1% Lidocaine.  Ultrasound guidance was used to infiltrate the region with 1% lidocaine for local  anesthesia. Four separate 25 gauge fine needle biopsy were then acquired of the left thyroid nodule using ultrasound guidance. Images were stored.  Slide preparation was performed.  Final image was stored after biopsy.  Patient tolerated the procedure well and remained hemodynamically stable throughout.  No complications were encountered and no significant blood loss was encounter  FINDINGS: Ultrasound survey of the left thyroid demonstrates heterogeneous nodule inferiorly.  Images during the case demonstrate needle tip within the nodule on each needle pass.  Final ultrasound image demonstrates no complicating features.  IMPRESSION: Status post ultrasound-guided biopsy of left inferior thyroid nodule. Specimen sent to pathology for complete histopathologic analysis.  Signed,  Dulcy Fanny. Earleen Newport, DO  [facility]  [facility]   Electronically Signed   By: Corrie Mckusick D.O.   On: 03/10/2015 14:44   Dg  Chest Port 1 View  03/14/2015   CLINICAL DATA:  Abnormal labs, suspicious for infection.  EXAM: PORTABLE CHEST - 1 VIEW  COMPARISON:  03/12/2015  FINDINGS: There is persistent right pleural fluid or thickening in the lateral base. Mild linear basilar opacities persist and may represent scarring. Lungs are otherwise clear. Hilar, mediastinal and cardiac contours are unremarkable and unchanged. Pulmonary vasculature is normal.  IMPRESSION: Unchanged, would minimal linear basilar opacities and right lateral pleural fluid/thickening.   Electronically Signed   By: Andreas Newport M.D.   On: 03/14/2015 03:09   Dg Knee Complete 4 Views Right  03/05/2015   CLINICAL DATA:  Acute onset of right knee pain and swelling. Initial encounter.  EXAM: RIGHT KNEE - COMPLETE 4+ VIEW  COMPARISON:  None.  FINDINGS: There is no evidence of fracture or dislocation. The joint spaces are preserved. No significant degenerative change is seen; the patellofemoral joint is grossly unremarkable in  appearance.  A small knee joint effusion is noted. The visualized soft tissues are otherwise unremarkable.  IMPRESSION: 1. No evidence of fracture or dislocation. 2. Small knee joint effusion noted.   Electronically Signed   By: Garald Balding M.D.   On: 03/05/2015 22:31   Dg Hand Complete Right  03/05/2015   CLINICAL DATA:  Bilateral hand pain and swelling. Initial encounter.  EXAM: RIGHT HAND - COMPLETE 3+ VIEW  COMPARISON:  None.  FINDINGS: There is no evidence of fracture or dislocation. The joint spaces are preserved. The carpal rows are intact, and demonstrate normal alignment. Mild dorsal soft tissue swelling is noted about the wrist.  IMPRESSION: No evidence of fracture or dislocation.   Electronically Signed   By: Garald Balding M.D.   On: 03/05/2015 22:30   US Thoracentesis Asp Pleural Space W/img Guide  03/17/2015   INDICATION: Symptomatic R sided pleural effusion  EXAM: US THORACENTESIS ASP PLEURAL SPACE W/IMG GUIDE  COMPARISON:  None.  MEDICATIONS: 10 cc 1% lidocaine  COMPLICATIONS: None immediate  TECHNIQUE: Informed written consent was obtained from the patient after a discussion of the risks, benefits and alternatives to treatment. A timeout was performed prior to the initiation of the procedure.  Initial ultrasound scanning demonstrates a right pleural effusion. The lower chest was prepped and draped in the usual sterile fashion. 1% lidocaine was used for local anesthesia.  Under direct ultrasound guidance, a 19 gauge, 7-cm, Yueh catheter was introduced. An ultrasound image was saved for documentation purposes. the thoracentesis was performed. The catheter was removed and a dressing was applied. The patient tolerated the procedure well without immediate post procedural complication. The patient was escorted to have an upright chest radiograph.  FINDINGS: A total of approximately 580 cc of yellow fluid was removed. Requested samples were sent to the laboratory.  IMPRESSION: Successful  ultrasound-guided R sided thoracentesis yielding 580 cc of pleural fluid.  Read by:  Lavonia Drafts [facility]   Electronically Signed   By: Sandi Mariscal M.D.   On: 03/17/2015 15:13   US Thoracentesis Asp Pleural Space W/img Guide  03/03/2015   INDICATION: Dyspnea, bilateral pleural effusions; request is made for diagnostic and therapeutic right thoracentesis.  EXAM: ULTRASOUND GUIDED DIAGNOSTIC AND THERAPEUTIC RIGHT THORACENTESIS  COMPARISON:  None.  MEDICATIONS: None  COMPLICATIONS: None immediate  TECHNIQUE: Informed written consent was obtained from the patient via interpreter after a discussion of the risks, benefits and alternatives to treatment. A timeout was performed prior to the initiation of the procedure.  Initial ultrasound scanning demonstrates  a pleural effusion. The lower chest was prepped and draped in the usual sterile fashion. 1% lidocaine was used for local anesthesia.  An ultrasound image was saved for documentation purposes. A 6 Fr Safe-T-Centesis catheter was introduced. The thoracentesis was performed. The catheter was removed and a dressing was applied. The patient tolerated the procedure well without immediate post procedural complication. The patient was escorted to have an upright chest radiograph.  FINDINGS: A total of approximately 360 cc's of slightly turbid ,yellow fluid was removed. Requested samples were sent to the laboratory.  IMPRESSION: Successful ultrasound-guided diagnostic and therapeutic right sided thoracentesis yielding 360 cc's of pleural fluid.  Read by: Rowe Robert, PA-C   Electronically Signed   By: Markus Daft M.D.   On: 03/03/2015 16:41    CBC  Recent Labs Lab 03/12/15 1325 03/14/15 0239 03/15/15 1430 03/16/15 0745 03/19/15 0612  WBC 4.4 4.3 3.8* 4.2 4.2  HGB 10.5* 10.2* 8.3* 8.3* 8.0*  HCT 32.0* 31.8* 24.9* 25.4* 24.5*  PLT 624* 522* 434* 423* 417*  MCV 83.6 83.7 82.5 82.7 83.6  MCH 27.4 26.8 27.5 27.0 27.3  MCHC 32.8 32.1 33.3 32.7 32.7  RDW 13.4  13.1 13.2 13.4 13.8  LYMPHSABS 0.8 1.1 1.0  --   --   MONOABS 0.4 0.4 0.3  --   --   EOSABS 0.1 0.2 0.1  --   --   BASOSABS 0.0 0.0 0.0  --   --     Chemistries   Recent Labs Lab 03/12/15 1325 03/14/15 0239 03/15/15 0940 03/16/15 0745 03/19/15 0612  NA 136 137 134* 138 136  K 4.2 4.7 3.8 3.6 3.7  CL 101 105 107 108 106  CO2 21 21 20 22 21   GLUCOSE 101* 98 166* 111* 124*  BUN 7 8 <5* <5* <5*  CREATININE 0.53 0.56 0.66 0.55 0.50  CALCIUM 8.9 8.8 8.1* 8.1* 7.9*  AST 69* 61* 41* 37 24  ALT 65* 58* 28 24 14   ALKPHOS 93 114 71 68 63  BILITOT 0.4 1.2 0.5 0.2* 0.3   ------------------------------------------------------------------------------------------------------------------ estimated creatinine clearance is 71.7 mL/min (by C-G formula based on Cr of 0.5). ------------------------------------------------------------------------------------------------------------------ No results for input(s): HGBA1C in the last 72 hours. ------------------------------------------------------------------------------------------------------------------  Recent Labs  03/17/15 1730  CHOL 106  TRIG 74   ------------------------------------------------------------------------------------------------------------------ No results for input(s): TSH, T4TOTAL, T3FREE, THYROIDAB in the last 72 hours.  Invalid input(s): FREET3 ------------------------------------------------------------------------------------------------------------------ No results for input(s): VITAMINB12, FOLATE, FERRITIN, TIBC, IRON, RETICCTPCT in the last 72 hours.  Coagulation profile No results for input(s): INR, PROTIME in the last 168 hours.  No results for input(s): DDIMER in the last 72 hours.  Cardiac Enzymes No results for input(s): CKMB, TROPONINI, MYOGLOBIN in the last 168 hours.  Invalid input(s):  CK ------------------------------------------------------------------------------------------------------------------ Invalid input(s): POCBNP    Angelica Wix D.O. on 03/19/2015 at 11:10 AM  Between 7am to 7pm - Pager - [phone]  After 7pm go to [email or web address] - password [email or web address]  And look for the night coverage person covering for me after hours  [facility] Office  [phone]

## 2015-03-19 NOTE — Consult Note (Signed)
ORTHOPAEDIC CONSULTATION  REQUESTING PHYSICIAN: Cristal Ford, DO  Chief Complaint: Right knee swelling  HPI: Nichole Perry is a 44 y.o. female who complains of right knee swelling.  She has Coag Neg staph bacteremia.  Patient is su[place]ese.  Also has infectious pleural effusion.  Ortho consulted for right knee swelling to r/o infection.  Past Medical History  Diagnosis Date  . Anemia   . Goiter 02/07/2012   Past Surgical History  Procedure Laterality Date  . Back surgery     History   Social History  . Marital Status: Married    Spouse Name: N/A  . Number of Children: 0  . Years of Education: N/A   Social History Main Topics  . Smoking status: Never Smoker   . Smokeless tobacco: Never Used  . Alcohol Use: No  . Drug Use: No  . Sexual Activity:    Partners: Male    Birth Control/ Protection: None   Other Topics Concern  . None   Social History Narrative   From [place].   Family History  Problem Relation Age of Onset  . Hypertension Mother   . Diabetes Father    No Known Allergies Prior to Admission medications   Medication Sig Start Date End Date Taking? Authorizing Provider  acetaminophen (TYLENOL) 500 MG tablet Take 500 mg by mouth every 6 (six) hours as needed for fever.   Yes Historical Provider, MD  azithromycin (ZITHROMAX) 500 MG tablet Take 1,000 mg by mouth every 8 (eight) hours.   Yes Historical Provider, MD  furosemide (LASIX) 20 MG tablet Take 1 tablet (20 mg total) by mouth daily. 02/21/15  Yes Wardell Honour, MD  meloxicam (MOBIC) 15 MG tablet Take 1 tablet (15 mg total) by mouth daily. 03/12/15  Yes Wardell Honour, MD  VITAMIN D, CHOLECALCIFEROL, PO Take 1 capsule by mouth daily.   Yes Historical Provider, MD   Dg Chest 1 View  03/17/2015   CLINICAL DATA:  Right sided thoracentesis, status post removal of 580 cc of yellow fluid. Cough.  EXAM: CHEST  1 VIEW  COMPARISON:  03/14/2015  FINDINGS: No pneumothorax. Mild atelectasis medially at the right  lung base and the right perihilar region. Prior blunting of the right costophrenic angle resolved. There is a moderate left pleural effusion silhouetting the left heart border.  Borderline enlargement of the cardiopericardial silhouette.  IMPRESSION: 1. Resolution of prior right pleural effusion, with some residual medial right-sided atelectasis. Increase in left pleural effusion, currently moderate in size, with associated passive atelectasis.   Electronically Signed   By: Van Clines M.D.   On: 03/17/2015 14:56   US Thoracentesis Asp Pleural Space W/img Guide  03/17/2015   INDICATION: Symptomatic R sided pleural effusion  EXAM: US THORACENTESIS ASP PLEURAL SPACE W/IMG GUIDE  COMPARISON:  None.  MEDICATIONS: 10 cc 1% lidocaine  COMPLICATIONS: None immediate  TECHNIQUE: Informed written consent was obtained from the patient after a discussion of the risks, benefits and alternatives to treatment. A timeout was performed prior to the initiation of the procedure.  Initial ultrasound scanning demonstrates a right pleural effusion. The lower chest was prepped and draped in the usual sterile fashion. 1% lidocaine was used for local anesthesia.  Under direct ultrasound guidance, a 19 gauge, 7-cm, Yueh catheter was introduced. An ultrasound image was saved for documentation purposes. the thoracentesis was performed. The catheter was removed and a dressing was applied. The patient tolerated the procedure well without immediate post procedural complication. The patient  was escorted to have an upright chest radiograph.  FINDINGS: A total of approximately 580 cc of yellow fluid was removed. Requested samples were sent to the laboratory.  IMPRESSION: Successful ultrasound-guided R sided thoracentesis yielding 580 cc of pleural fluid.  Read by:  Lavonia Drafts [facility]   Electronically Signed   By: Sandi Mariscal M.D.   On: 03/17/2015 15:13    Positive ROS: All other systems have been reviewed and were otherwise negative  with the exception of those mentioned in the HPI and as above.  Physical Exam: General: Alert, no acute distress Cardiovascular: No pedal edema Respiratory: No cyanosis, no use of accessory musculature GI: No organomegaly, abdomen is soft and non-tender Skin: No lesions in the area of chief complaint Neurologic: Sensation intact distally Psychiatric: Patient is competent for consent with normal mood and affect Lymphatic: No axillary or cervical lymphadenopathy  MUSCULOSKELETAL:  Small effusion of right knee without erythema, warmth, cellulitis.  Skin intact.  ROM is decent.    Assessment: Right knee effusion  Plan: - aspiration performed at bedside - fluid does not appear grossly infected - will follow - xrays ordered - fluid sent for cell count, crystals, bacterial culture, AFB, fungal  Thank you for the consult and the opportunity to see Nichole Perry  N. Eduard Roux, MD [place] 12:06 PM

## 2015-03-19 NOTE — Consult Note (Signed)
[facility] Surgery Consult Note  Nichole Perry 12/24/1970  [id number].    Requesting MD: Dr. Ree Kida Chief Complaint/Reason for Consult:  Need for lymph node biopsy  HPI:  44 y/o [place] female with a history of goiter and anemia presented to the hospital after finding out she had positive blood cultures. The patient has not been feeling well for nearly 3 weeks. The patient initially presented to her PCP because of bilateral knee pain and leg swelling on 02/21/2015.  Dr. Erlinda Hong from ortho now following her for that.  Noted chronic cough since December.  Chest x-ray shows moderate bilateral pleural effusions. CT angio of the chest was performed on 02/21/2015 which was negative for pulmonary embolus, but revealed bilateral pleural effusions and central bronchial wall thickening and enlarged right axillary lymph node.   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  All other workup from PCP and here including autoimmune, HIV, hepatitis,  has been negative.  Notably, the patient also was noted to have a right thyroid nodule which has been biopsied on 03/10/2015. The biopsy revealed benign follicular nodule.  She moved from [place] 4 years ago.  She denies being around new refugees or homeless or other at risk populations.  Coag negative staph bacteremia on blood cultures, but unsure if contaminant or true bacteremia, repeat blood cultures pending.  We have been asked to perform an excisional biopsy.  She has large right axillary lymph nodes which are palpable.    The patients friend helps with [place] translation.  ROS: All systems reviewed and otherwise negative except for as above  Family History  Problem Relation Age of Onset  . Hypertension Mother   . Diabetes Father     Past Medical History  Diagnosis Date  . Anemia   . Goiter 02/07/2012    Past Surgical History  Procedure Laterality Date  . Back surgery      Social History:  reports that she has never smoked.  She has never used smokeless tobacco. She reports that she does not drink alcohol or use illicit drugs.  Allergies: No Known Allergies  Medications Prior to Admission  Medication Sig Dispense Refill  . acetaminophen (TYLENOL) 500 MG tablet Take 500 mg by mouth every 6 (six) hours as needed for fever.    Marland Kitchen azithromycin (ZITHROMAX) 500 MG tablet Take 1,000 mg by mouth every 8 (eight) hours.    . furosemide (LASIX) 20 MG tablet Take 1 tablet (20 mg total) by mouth daily. 30 tablet 0  . meloxicam (MOBIC) 15 MG tablet Take 1 tablet (15 mg total) by mouth daily. 30 tablet 0  . VITAMIN D, CHOLECALCIFEROL, PO Take 1 capsule by mouth daily.      Blood pressure 105/71, pulse 98, temperature 100 F (37.8 C), temperature source Oral, resp. rate 18, height 5' 2"  (1.575 m), weight 56.609 kg (124 lb 12.8 oz), last menstrual period 11/19/2014, SpO2 96 %. Physical Exam: General: pleasant, WD/WN female who is laying in bed in NAD HEENT: head is normocephalic, atraumatic.  Sclera are noninjected.  PERRL.  Ears and nose without any masses or lesions.  Mouth is pink and moist.   Lymph:  No significant lymphadenopathy palpated to head/neck or groin.  Notable right axillary lymph node which is soft mobile, but feels like 2 matted together (4cm x 2cm oval), not tender. Heart: regular, rate, and rhythm.  No obvious murmurs, gallops, or rubs noted.  Palpable pedal pulses bilaterally Lungs: CTAB, no wheezes, rhonchi, or  rales noted.  Respiratory effort nonlabored Abd: soft, NT/ND, +BS, no masses, hernias, or organomegaly MS: all 4 extremities with noted edema to knees and to elbows Skin: warm and dry with no masses, lesions, or rashes Psych: A&Ox3 with an appropriate affect.   Results for orders placed or performed during the hospital encounter of 03/14/15 (from the past 48 hour(s))  Lactate dehydrogenase, pleural or peritoneal fluid     Status: Abnormal   Collection Time: 03/17/15  2:27 PM  Result Value Ref  Range   LD, Fluid 124 (H) 3 - 23 U/L    Comment: (NOTE) Results should be evaluated in conjunction with serum values    Fluid Type-FLDH Pleural R   Protein, pleural or peritoneal fluid     Status: None   Collection Time: 03/17/15  2:27 PM  Result Value Ref Range   Total protein, fluid 3.1 g/dL    Comment: (NOTE) No normal range established for this test Results should be evaluated in conjunction with serum values    Fluid Type-FTP FLUID     Comment: RIGHT PLEURAL CORRECTED ON 04/05 AT 1450: PREVIOUSLY REPORTED AS Pleural R   Body fluid cell count with differential     Status: Abnormal   Collection Time: 03/17/15  2:27 PM  Result Value Ref Range   Fluid Type-FCT FLUID     Comment: RIGHT PLEURAL CORRECTED ON 04/05 AT 1449: PREVIOUSLY REPORTED AS Pleural R    Color, Fluid YELLOW (A) YELLOW   Appearance, Fluid HAZY (A) CLEAR   WBC, Fluid 797 0 - 1000 cu mm   Neutrophil Count, Fluid 9 0 - 25 %   Lymphs, Fluid 75 %   Monocyte-Macrophage-Serous Fluid 8 (L) 50 - 90 %   Eos, Fluid 8 %  Body fluid culture     Status: None (Preliminary result)   Collection Time: 03/17/15  2:27 PM  Result Value Ref Range   Specimen Description FLUID RIGHT PLEURAL    Special Requests NONE    Gram Stain      RARE WBC PRESENT,BOTH PMN AND MONONUCLEAR NO ORGANISMS SEEN Performed at [facility]    Culture NO GROWTH Performed at [facility]     Report Status PENDING   Glucose, pleural or peritoneal fluid     Status: None   Collection Time: 03/17/15  2:27 PM  Result Value Ref Range   Glucose, Fluid 150 mg/dL    Comment: (NOTE) No normal range established for this test Results should be evaluated in conjunction with serum values    Fluid Type-FGLU FLUID     Comment: RIGHT PLEURAL CORRECTED ON 04/05 AT 1451: PREVIOUSLY REPORTED AS Pleural R   Amylase, Pleural Fluid     Status: None   Collection Time: 03/17/15  2:27 PM  Result Value Ref Range   Amylase, Pleural Fluid 16 U/L     Comment: (NOTE) Reference range: Values are considered abnormal if they are greater than or equal to two times a simultaneously analyzed serum value. Performed at [facility]     Status: None   Collection Time: 03/17/15  2:54 PM  Result Value Ref Range   ENA SM Ab Ser-aCnc <1.0 NEG <1.0 NEG AI    Comment: Performed at [facility]  Cholesterol, total     Status: None   Collection Time: 03/17/15  5:30 PM  Result Value Ref Range   Cholesterol 106 0 - 200 mg/dL  Triglycerides  Status: None   Collection Time: 03/17/15  5:30 PM  Result Value Ref Range   Triglycerides 74 <150 mg/dL  Angiotensin converting enzyme     Status: None   Collection Time: 03/17/15  6:38 PM  Result Value Ref Range   Angiotensin-Converting Enzyme 34 14 - 82 U/L    Comment: (NOTE) Performed At: [facility] [place], [place] [id number] Lindon Romp MD UX:[id number]   Sedimentation rate     Status: Abnormal   Collection Time: 03/17/15  6:38 PM  Result Value Ref Range   Sed Rate 86 (H) 0 - 22 mm/hr  C-reactive protein     Status: Abnormal   Collection Time: 03/17/15  6:38 PM  Result Value Ref Range   CRP 19.8 (H) <0.60 mg/dL    Comment: Performed at [facility]  Procalcitonin     Status: None   Collection Time: 03/18/15  5:50 AM  Result Value Ref Range   Procalcitonin 0.10 ng/mL    Comment:        Interpretation: PCT (Procalcitonin) <= 0.5 ng/mL: Systemic infection (sepsis) is not likely. Local bacterial infection is possible. (NOTE)         ICU PCT Algorithm               Non ICU PCT Algorithm    ----------------------------     ------------------------------         PCT < 0.25 ng/mL                 PCT < 0.1 ng/mL     Stopping of antibiotics            Stopping of antibiotics       strongly encouraged.               strongly encouraged.    ----------------------------     ------------------------------       PCT level  decrease by               PCT < 0.25 ng/mL       >= 80% from peak PCT       OR PCT 0.25 - 0.5 ng/mL          Stopping of antibiotics                                             encouraged.     Stopping of antibiotics           encouraged.    ----------------------------     ------------------------------       PCT level decrease by              PCT >= 0.25 ng/mL       < 80% from peak PCT        AND PCT >= 0.5 ng/mL            Continuin g antibiotics                                              encouraged.       Continuing antibiotics            encouraged.    ----------------------------     ------------------------------  PCT level increase compared          PCT > 0.5 ng/mL         with peak PCT AND          PCT >= 0.5 ng/mL             Escalation of antibiotics                                          strongly encouraged.      Escalation of antibiotics        strongly encouraged.   CBC     Status: Abnormal   Collection Time: 03/19/15  6:12 AM  Result Value Ref Range   WBC 4.2 4.0 - 10.5 K/uL   RBC 2.93 (L) 3.87 - 5.11 MIL/uL   Hemoglobin 8.0 (L) 12.0 - 15.0 g/dL   HCT 24.5 (L) 36.0 - 46.0 %   MCV 83.6 78.0 - 100.0 fL   MCH 27.3 26.0 - 34.0 pg   MCHC 32.7 30.0 - 36.0 g/dL   RDW 13.8 11.5 - 15.5 %   Platelets 417 (H) 150 - 400 K/uL  Comprehensive metabolic panel     Status: Abnormal   Collection Time: 03/19/15  6:12 AM  Result Value Ref Range   Sodium 136 135 - 145 mmol/L   Potassium 3.7 3.5 - 5.1 mmol/L   Chloride 106 96 - 112 mmol/L   CO2 21 19 - 32 mmol/L   Glucose, Bld 124 (H) 70 - 99 mg/dL   BUN <5 (L) 6 - 23 mg/dL   Creatinine, Ser 0.50 0.50 - 1.10 mg/dL   Calcium 7.9 (L) 8.4 - 10.5 mg/dL   Total Protein 5.3 (L) 6.0 - 8.3 g/dL   Albumin 1.6 (L) 3.5 - 5.2 g/dL   AST 24 0 - 37 U/L   ALT 14 0 - 35 U/L   Alkaline Phosphatase 63 39 - 117 U/L   Total Bilirubin 0.3 0.3 - 1.2 mg/dL   GFR calc non Af Amer >90 >90 mL/min   GFR calc Af Amer >90 >90 mL/min     Comment: (NOTE) The eGFR has been calculated using the CKD EPI equation. This calculation has not been validated in all clinical situations. eGFR's persistently <90 mL/min signify possible Chronic Kidney Disease.    Anion gap 9 5 - 15   Dg Chest 1 View  03/17/2015   CLINICAL DATA:  Right sided thoracentesis, status post removal of 580 cc of yellow fluid. Cough.  EXAM: CHEST  1 VIEW  COMPARISON:  03/14/2015  FINDINGS: No pneumothorax. Mild atelectasis medially at the right lung base and the right perihilar region. Prior blunting of the right costophrenic angle resolved. There is a moderate left pleural effusion silhouetting the left heart border.  Borderline enlargement of the cardiopericardial silhouette.  IMPRESSION: 1. Resolution of prior right pleural effusion, with some residual medial right-sided atelectasis. Increase in left pleural effusion, currently moderate in size, with associated passive atelectasis.   Electronically Signed   By: Van Clines M.D.   On: 03/17/2015 14:56   US Thoracentesis Asp Pleural Space W/img Guide  03/17/2015   INDICATION: Symptomatic R sided pleural effusion  EXAM: US THORACENTESIS ASP PLEURAL SPACE W/IMG GUIDE  COMPARISON:  None.  MEDICATIONS: 10 cc 1% lidocaine  COMPLICATIONS: None immediate  TECHNIQUE: Informed written consent was obtained from the patient after  a discussion of the risks, benefits and alternatives to treatment. A timeout was performed prior to the initiation of the procedure.  Initial ultrasound scanning demonstrates a right pleural effusion. The lower chest was prepped and draped in the usual sterile fashion. 1% lidocaine was used for local anesthesia.  Under direct ultrasound guidance, a 19 gauge, 7-cm, Yueh catheter was introduced. An ultrasound image was saved for documentation purposes. the thoracentesis was performed. The catheter was removed and a dressing was applied. The patient tolerated the procedure well without immediate post  procedural complication. The patient was escorted to have an upright chest radiograph.  FINDINGS: A total of approximately 580 cc of yellow fluid was removed. Requested samples were sent to the laboratory.  IMPRESSION: Successful ultrasound-guided R sided thoracentesis yielding 580 cc of pleural fluid.  Read by:  Lavonia Drafts [facility]   Electronically Signed   By: Sandi Mariscal M.D.   On: 03/17/2015 15:13      Assessment/Plan Enlarged right axillary lymph nodes/Need for lymph node biopsy  -Requested lymph node biopsy of right axilla, will discuss with Dr. Lucia Gaskins about the timing of this.  Hopefully tomorrow.  NPO after MN, She's not on any blood thinners.  Already on Ancef every 8 hours for bacteremia.    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 Erlinda Hong following from ortho, joint aspiration results pending 4cm left lower pole thyroid mass -benign follicular nodule  Coralie Keens, [facility] Surgery 03/19/2015, 1:04 PM Pager: [phone]  Agree with above. Discussed with husband and patient. I feel a vague area in the right axilla - possible lymph node. Will schedule for Friday to excise.   I discussed indications and risks of surgery. Risks include infection (which she may have), bleeding, seroma, and nerve injury.   Alphonsa Overall, MD, [facility] Surgery Pager: [phone] Office phone:  [phone]

## 2015-03-19 NOTE — Progress Notes (Signed)
Per Wannetta Sender, CMA, Scheduler for the Cardiologists, she spoke to the hospitalist yesterday and they agreed not to proceed with the TEE until the pt was no longer on contact for TB precautions. Pt's RN, Sunday Spillers, made aware that the pt's TEE was cancelled today. Brt, rn

## 2015-03-19 NOTE — Progress Notes (Signed)
Name: Nichole Perry MRN: [id number] DOB: [birth year]    ADMISSION DATE:  03/14/2015 CONSULTATION DATE:  03/15/15  REFERRING MD :  Tat  CHIEF COMPLAINT:  Knee pain  BRIEF PATIENT DESCRIPTION: 42 yoF never smoker from [place] presented 3 week hx knee pain and swelling, pleural effusions, R axillary lymph node, bronchial thickening. Goiter. Quantiferon Gold Positive. Exudative pleural effusion with fluid cx negative so far. Blood cultures + GPCs in clusters. PCCM asked to consider BAL to look for M.tb.   SIGNIFICANT EVENTS  CTa chest 3/12- bilat pleural effusions Thoracentesis 3/22- WBC 3000 (64% monos, 28% PMN, 8% EOS)   STUDIES:  Thoracentesis fluid cx neg so far for routine and AFB   SUBJECTIVE:  No sig change   VITAL SIGNS: Temp:  [99.7 F (37.6 C)-100.1 F (37.8 C)] 100 F (37.8 C) (04/07 0502) Pulse Rate:  [91-98] 98 (04/07 0502) BP: (104-105)/(56-71) 105/71 mmHg (04/07 0502) SpO2:  [96 %-100 %] 96 % (04/07 0502) Weight:  [56.609 kg (124 lb 12.8 oz)] 56.609 kg (124 lb 12.8 oz) (04/07 0502)  PHYSICAL EXAMINATION: General:  Pleasant cooperative, looks well, NAD, sleep this AM. Neuro:  Grossly intact, non-focal HEENT:Gross hearing and vision intact, sclerae and conjunctiva clear Cardiovascular:  RRR, no mgr Lungs:  Dull in bases, unlabored, no rub, cough or wheeze Abdomen:  scaphoid Musculoskeletal:  Normal bulk, needed some assistance to sit up in bed Skin:  Exposed skin w/o rash or bruising   Recent Labs Lab 03/15/15 0940 03/16/15 0745 03/19/15 0612  NA 134* 138 136  K 3.8 3.6 3.7  CL 107 108 106  CO2 20 22 21   BUN <5* <5* <5*  CREATININE 0.66 0.55 0.50  GLUCOSE 166* 111* 124*    Recent Labs Lab 03/15/15 1430 03/16/15 0745 03/19/15 0612  HGB 8.3* 8.3* 8.0*  HCT 24.9* 25.4* 24.5*  WBC 3.8* 4.2 4.2  PLT 434* 423* 417*   Dg Chest 1 View  03/17/2015   CLINICAL DATA:  Right sided thoracentesis, status post removal of 580 cc of yellow fluid. Cough.  EXAM:  CHEST  1 VIEW  COMPARISON:  03/14/2015  FINDINGS: No pneumothorax. Mild atelectasis medially at the right lung base and the right perihilar region. Prior blunting of the right costophrenic angle resolved. There is a moderate left pleural effusion silhouetting the left heart border.  Borderline enlargement of the cardiopericardial silhouette.  IMPRESSION: 1. Resolution of prior right pleural effusion, with some residual medial right-sided atelectasis. Increase in left pleural effusion, currently moderate in size, with associated passive atelectasis.   Electronically Signed   By: Van Clines M.D.   On: 03/17/2015 14:56   US Thoracentesis Asp Pleural Space W/img Guide  03/17/2015   INDICATION: Symptomatic R sided pleural effusion  EXAM: US THORACENTESIS ASP PLEURAL SPACE W/IMG GUIDE  COMPARISON:  None.  MEDICATIONS: 10 cc 1% lidocaine  COMPLICATIONS: None immediate  TECHNIQUE: Informed written consent was obtained from the patient after a discussion of the risks, benefits and alternatives to treatment. A timeout was performed prior to the initiation of the procedure.  Initial ultrasound scanning demonstrates a right pleural effusion. The lower chest was prepped and draped in the usual sterile fashion. 1% lidocaine was used for local anesthesia.  Under direct ultrasound guidance, a 19 gauge, 7-cm, Yueh catheter was introduced. An ultrasound image was saved for documentation purposes. the thoracentesis was performed. The catheter was removed and a dressing was applied. The patient tolerated the procedure well without immediate post procedural complication.  The patient was escorted to have an upright chest radiograph.  FINDINGS: A total of approximately 580 cc of yellow fluid was removed. Requested samples were sent to the laboratory.  IMPRESSION: Successful ultrasound-guided R sided thoracentesis yielding 580 cc of pleural fluid.  Read by:  Lavonia Drafts [facility]   Electronically Signed   By: Sandi Mariscal M.D.   On:  03/17/2015 15:13   I reviewed radiology images. Pleural effusions have improved with small residual on right  ASSESSMENT / PLAN: Coag negative staph bacteremia- Unclear if this is a contamination vs true infection (did have 2 positive cultures on 3/31).  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 [place] She has no evidence of granulomatous disease on lung imaging, and has not had chronic cough, night sweats, weight loss to suggest subacute active TB infection in lung. Traditionally, a pleural bx would have been recommended, but we no longer offer that here.   Recommend Annual CXR for surveillance. I reviewed the CXR myself, little fluid on the right and some fluid noted on the left but not enough to call large volume thora, would be unsafe to tap bedside. CRP resulted and is very elevated.  Rest of auto-immune work up sent yesterday pending. For TEE today looking for endocarditis. ADA, MTB-PCR and INF-gamma all sent from first thora and are pending. Joint tap today per ortho.  I discussed the case with Dr. Carolynn Comment (primary) and Dr. Johnnye Sima (ID) I would simply recommend initiation of treatment for TB as this is a patient with all the classic symptoms of TB who has a positive quantiferon gold for it and comes from an endemic area.  I think further testing would be to explain the swelling in her joints but would be completely unrelated.  If ADA and MTB PCR are negative I would still treat it as TB and if pleural effusion returns then we can pursue a pleural biopsy.  PCCM will be available PRN at this point, if pleural effusion returns or the left sided increases please do not hesitate to call us back for further interventions.  Rush Farmer, M.D. [facility] Pulmonary/Critical Care Medicine. Pager: [phone]. After hours pager: [phone].  03/19/2015, 1:16 PM

## 2015-03-19 NOTE — Progress Notes (Signed)
Utilization review completed.  

## 2015-03-20 ENCOUNTER — Encounter (HOSPITAL_COMMUNITY): Payer: Self-pay

## 2015-03-20 ENCOUNTER — Inpatient Hospital Stay (HOSPITAL_COMMUNITY): Payer: 59

## 2015-03-20 ENCOUNTER — Inpatient Hospital Stay (HOSPITAL_COMMUNITY): Payer: 59 | Admitting: Anesthesiology

## 2015-03-20 ENCOUNTER — Encounter (HOSPITAL_COMMUNITY)
Admission: EM | Disposition: A | Discharge status: Home / Self Care | Payer: Self-pay | Source: Home / Self Care | Attending: Internal Medicine

## 2015-03-20 DIAGNOSIS — Z0271 Encounter for disability determination: Secondary | ICD-10-CM

## 2015-03-20 HISTORY — PX: AXILLARY LYMPH NODE BIOPSY: SHX5737

## 2015-03-20 LAB — BASIC METABOLIC PANEL
Anion gap: 9 (ref 5–15)
CHLORIDE: 106 mmol/L (ref 96–112)
CO2: 23 mmol/L (ref 19–32)
CREATININE: 0.46 mg/dL — AB (ref 0.50–1.10)
Calcium: 8.4 mg/dL (ref 8.4–10.5)
GFR calc Af Amer: >90 mL/min (ref >90)
GFR calc non Af Amer: >90 mL/min (ref >90)
GLUCOSE: 114 mg/dL — AB (ref 70–99)
Potassium: 3.6 mmol/L (ref 3.5–5.1)
Sodium: 138 mmol/L (ref 135–145)

## 2015-03-20 LAB — CBC
HCT: 25.6 % — ABNORMAL LOW (ref 36.0–46.0)
Hemoglobin: 8.2 g/dL — ABNORMAL LOW (ref 12.0–15.0)
MCH: 26.9 pg (ref 26.0–34.0)
MCHC: 32 g/dL (ref 30.0–36.0)
MCV: 83.9 fL (ref 78.0–100.0)
PLATELETS: 395 10*3/uL (ref 150–400)
RBC: 3.05 MIL/uL — AB (ref 3.87–5.11)
RDW: 14 % (ref 11.5–15.5)
WBC: 4.7 10*3/uL (ref 4.0–10.5)

## 2015-03-20 LAB — CULTURE, BLOOD (ROUTINE X 2)
CULTURE: NO GROWTH
Culture: NO GROWTH

## 2015-03-20 LAB — D-DIMER, QUANTITATIVE (NOT AT ARMC): D-Dimer, Quant: 7.8 ug/mL-FEU — ABNORMAL HIGH (ref 0.00–0.48)

## 2015-03-20 LAB — BRAIN NATRIURETIC PEPTIDE: B Natriuretic Peptide: 210.4 pg/mL — ABNORMAL HIGH (ref 0.0–100.0)

## 2015-03-20 LAB — PROCALCITONIN: Procalcitonin: 0.12 ng/mL

## 2015-03-20 SURGERY — AXILLARY LYMPH NODE BIOPSY
Anesthesia: General | Site: Axilla | Laterality: Right

## 2015-03-20 MED ORDER — LIDOCAINE HCL (PF) 1 % IJ SOLN
INTRAMUSCULAR | Status: AC
  Filled 2015-03-20: qty 30

## 2015-03-20 MED ORDER — ONDANSETRON HCL 4 MG/2ML IJ SOLN
INTRAMUSCULAR | Status: DC | PRN
  Administered 2015-03-20: 4 mg via INTRAVENOUS

## 2015-03-20 MED ORDER — LACTATED RINGERS IV SOLN
INTRAVENOUS | Status: DC | PRN
  Administered 2015-03-20: 16:00:00 via INTRAVENOUS

## 2015-03-20 MED ORDER — BUPIVACAINE-EPINEPHRINE 0.25% -1:200000 IJ SOLN
INTRAMUSCULAR | Status: DC | PRN
  Administered 2015-03-20: 10 mL

## 2015-03-20 MED ORDER — LIDOCAINE HCL (CARDIAC) 20 MG/ML IV SOLN
INTRAVENOUS | Status: DC | PRN
  Administered 2015-03-20: 80 mg via INTRAVENOUS

## 2015-03-20 MED ORDER — FENTANYL CITRATE 0.05 MG/ML IJ SOLN
INTRAMUSCULAR | Status: AC
  Filled 2015-03-20: qty 5

## 2015-03-20 MED ORDER — BUPIVACAINE-EPINEPHRINE (PF) 0.25% -1:200000 IJ SOLN
INTRAMUSCULAR | Status: AC
  Filled 2015-03-20: qty 30

## 2015-03-20 MED ORDER — PROMETHAZINE HCL 25 MG/ML IJ SOLN: 6.2500 mg | INTRAMUSCULAR | Status: DC | PRN

## 2015-03-20 MED ORDER — SUCCINYLCHOLINE CHLORIDE 20 MG/ML IJ SOLN
INTRAMUSCULAR | Status: DC | PRN
  Administered 2015-03-20: 100 mg via INTRAVENOUS

## 2015-03-20 MED ORDER — MIDAZOLAM HCL 2 MG/2ML IJ SOLN
INTRAMUSCULAR | Status: AC
  Filled 2015-03-20: qty 2

## 2015-03-20 MED ORDER — PROPOFOL 10 MG/ML IV BOLUS
INTRAVENOUS | Status: AC
  Filled 2015-03-20: qty 20

## 2015-03-20 MED ORDER — LACTATED RINGERS IV SOLN
INTRAVENOUS | Status: DC
  Administered 2015-03-20: 16:00:00 via INTRAVENOUS

## 2015-03-20 MED ORDER — ROCURONIUM BROMIDE 50 MG/5ML IV SOLN
INTRAVENOUS | Status: AC
  Filled 2015-03-20: qty 1

## 2015-03-20 MED ORDER — HYDROMORPHONE HCL 1 MG/ML IJ SOLN: 0.2500 mg | INTRAMUSCULAR | Status: DC | PRN

## 2015-03-20 MED ORDER — FUROSEMIDE 10 MG/ML IJ SOLN
40.0000 mg | Freq: Once | INTRAMUSCULAR | Status: AC
Start: 2015-03-20 — End: 2015-03-20
  Administered 2015-03-20: 40 mg via INTRAVENOUS
  Filled 2015-03-20: qty 4

## 2015-03-20 MED ORDER — FENTANYL CITRATE 0.05 MG/ML IJ SOLN
INTRAMUSCULAR | Status: DC | PRN
  Administered 2015-03-20: 100 ug via INTRAVENOUS
  Administered 2015-03-20: 50 ug via INTRAVENOUS
  Administered 2015-03-20: 25 ug via INTRAVENOUS

## 2015-03-20 MED ORDER — IOHEXOL 350 MG/ML SOLN
80.0000 mL | Freq: Once | INTRAVENOUS | Status: AC | PRN
  Administered 2015-03-20: 80 mL via INTRAVENOUS

## 2015-03-20 MED ORDER — 0.9 % SODIUM CHLORIDE (POUR BTL) OPTIME
TOPICAL | Status: DC | PRN
  Administered 2015-03-20: 1000 mL

## 2015-03-20 MED ORDER — PHENYLEPHRINE HCL 10 MG/ML IJ SOLN
INTRAMUSCULAR | Status: DC | PRN
  Administered 2015-03-20: 40 ug via INTRAVENOUS

## 2015-03-20 MED ORDER — PROPOFOL 10 MG/ML IV BOLUS
INTRAVENOUS | Status: DC | PRN
  Administered 2015-03-20: 140 mg via INTRAVENOUS

## 2015-03-20 MED ORDER — LIDOCAINE HCL (CARDIAC) 20 MG/ML IV SOLN
INTRAVENOUS | Status: AC
  Filled 2015-03-20: qty 5

## 2015-03-20 SURGICAL SUPPLY — 40 items
BENZOIN TINCTURE PRP APPL 2/3 (GAUZE/BANDAGES/DRESSINGS) IMPLANT
BLADE SURG 15 STRL LF DISP TIS (BLADE) ×2 IMPLANT
BLADE SURG 15 STRL SS (BLADE) ×4
CANISTER SUCTION 2500CC (MISCELLANEOUS) IMPLANT
CHLORAPREP W/TINT 10.5 ML (MISCELLANEOUS) ×3 IMPLANT
CLOSURE WOUND 1/4X4 (GAUZE/BANDAGES/DRESSINGS)
COVER SURGICAL LIGHT HANDLE (MISCELLANEOUS) ×3 IMPLANT
DRAPE PED LAPAROTOMY (DRAPES) ×3 IMPLANT
DRAPE UTILITY XL STRL (DRAPES) ×3 IMPLANT
ELECT CAUTERY BLADE 6.4 (BLADE) ×3 IMPLANT
ELECT COATED BLADE 2.86 ST (ELECTRODE) ×3 IMPLANT
ELECT REM PT RETURN 9FT ADLT (ELECTROSURGICAL) ×3
ELECTRODE REM PT RTRN 9FT ADLT (ELECTROSURGICAL) ×1 IMPLANT
GAUZE SPONGE 4X4 12PLY STRL (GAUZE/BANDAGES/DRESSINGS) IMPLANT
GLOVE SURG SIGNA 7.5 PF LTX (GLOVE) ×3 IMPLANT
GOWN STRL REUS W/ TWL LRG LVL3 (GOWN DISPOSABLE) ×1 IMPLANT
GOWN STRL REUS W/ TWL XL LVL3 (GOWN DISPOSABLE) ×1 IMPLANT
GOWN STRL REUS W/TWL LRG LVL3 (GOWN DISPOSABLE) ×2
GOWN STRL REUS W/TWL XL LVL3 (GOWN DISPOSABLE) ×2
KIT BASIN OR (CUSTOM PROCEDURE TRAY) ×3 IMPLANT
KIT ROOM TURNOVER OR (KITS) ×3 IMPLANT
LIQUID BAND (GAUZE/BANDAGES/DRESSINGS) ×3 IMPLANT
NEEDLE HYPO 25GX1X1/2 BEV (NEEDLE) ×3 IMPLANT
NS IRRIG 1000ML POUR BTL (IV SOLUTION) ×3 IMPLANT
PACK SURGICAL SETUP 50X90 (CUSTOM PROCEDURE TRAY) ×3 IMPLANT
PAD ARMBOARD 7.5X6 YLW CONV (MISCELLANEOUS) ×3 IMPLANT
PENCIL BUTTON HOLSTER BLD 10FT (ELECTRODE) ×3 IMPLANT
SPONGE GAUZE 4X4 12PLY STER LF (GAUZE/BANDAGES/DRESSINGS) ×3 IMPLANT
SPONGE LAP 18X18 X RAY DECT (DISPOSABLE) ×3 IMPLANT
STRIP CLOSURE SKIN 1/4X4 (GAUZE/BANDAGES/DRESSINGS) IMPLANT
SUT MON AB 5-0 PS2 18 (SUTURE) ×3 IMPLANT
SUT VIC AB 3-0 SH 18 (SUTURE) ×3 IMPLANT
SYR BULB 3OZ (MISCELLANEOUS) ×3 IMPLANT
SYR CONTROL 10ML LL (SYRINGE) ×3 IMPLANT
TAPE CLOTH SURG 4X10 WHT LF (GAUZE/BANDAGES/DRESSINGS) ×3 IMPLANT
TOWEL OR 17X24 6PK STRL BLUE (TOWEL DISPOSABLE) ×3 IMPLANT
TOWEL OR 17X26 10 PK STRL BLUE (TOWEL DISPOSABLE) ×3 IMPLANT
TUBE CONNECTING 12'X1/4 (SUCTIONS) ×1
TUBE CONNECTING 12X1/4 (SUCTIONS) ×2 IMPLANT
YANKAUER SUCT BULB TIP NO VENT (SUCTIONS) ×3 IMPLANT

## 2015-03-20 NOTE — Op Note (Addendum)
03/14/2015 - 03/20/2015  6:05 PM  PATIENT:  [name], 44 y.o., female, MRN: [id number]  PREOP DIAGNOSIS:  Right axilla adenopathy  POSTOP DIAGNOSIS:   Right axillary adenopathy.  PROCEDURE:   Procedure(s): RIGHT AXILLARY LYMPH NODE BIOPSY  SURGEON:   Alphonsa Overall, M.D.  ASSISTANT:   None  ANESTHESIA:   general  Anesthesiologist: Finis Bud, MD; Roberts Gaudy, MD; Duane Boston, MD CRNA: Durwin Glaze Flowers, CRNA  General  EBL:  minimal  ml  LOCAL MEDICATIONS USED:   10 cc 1/4% marcaine  SPECIMEN:   Right axillary lymph node  COUNTS CORRECT:  YES  INDICATIONS FOR PROCEDURE:  Dashanique Brownstein is a 44 y.o. (DOB: 1971-03-31) [place]  female whose primary care physician is Maximino Greenland, MD and comes for right axillary lymph node biopsy.   She is seeing Dr. Page Spiro for ID.   The indications and risks of the surgery were explained to the patient.  The risks include, but are not limited to, infection, bleeding, and nerve injury.  PROCEDURE:  The patient was brought to room #1 at [facility].  She was given Ancef prior to surgery.  Her right axilla was prepped with chloroprep and sterilely dressed.   A time out was held and the surgical check list run.   An incision was made in the right axilla.  I found a 3.0 x 2.0 cm lymph node that looked abnormal and this was removed.   Scott, pathology PA [phone]) called and said he received the specimen.  He will run it for lymphoma and sent part to microbiology for evaluation for TB, fungi, and routine cultures.  I talked to Freda Munro in the lab and she knows what we want to send it for.   The wound was irrigated with 300 NS.  Then closed in layers with 3-0 Vicryl and 5-0 Monocryl for the skin.   The patient tolerated the surgery well.  She was transferred to the RR in good condition.  Sponge and needle count were correct at the end of the case.  Alphonsa Overall, MD, [facility] Surgery Pager: [phone] Office phone:   [phone]

## 2015-03-20 NOTE — Progress Notes (Signed)
Shift event:  Notified by RN at approx 2330 that pt became very SOB after walking to the BR. She denied CP and was not nauseated or diaphoretic. BBS were CTA but pt remained SOB w/ sats of 89-94% on 2L [place]. Pt was placed on NRB. Pt became tachycardic and earlier had become febrile at 102.7. Pt given Tylenol, Ibuprofen and small IVFB. RR RN was paged and responded to bedside. ABG obtained was unremarkable. Stat PCXR revealed cardiac enlargement and pulmonary edema (new for pt) and new (L) pleural effusion since prior study. Lasix 40 mg given IV. D-Dimer was elevated at 7.80 so CTA was obtained which was neg for PE but positive for bil pleural effusions. Pt's SOB resolved after approx 1 hour on NRB. 02 titrated back to 2L [place]. Pt diuresed approx 600cc and rested for the remainder of the night w/o any further episodes of SOB. BNP and repeat blood cultures pending.  Assessment/Plan: 1. Dypsnea w/ exertion: In setting of pt w/ likely sub-acute TB and ? New onset pulmonary edema. Has had persistent SOB and exudative pleural effusions > last 3 weeks. Follow BNP. Dypsnea resolved after Lasix and diuresis. 02 sats now 97% on 2L [place].  2. Fever: Pt has been running low grade fever. Recent positive blood cx's on 03/12/15. Repeat blood cx's pending Temp down after IVF's, Tylenol and Ibuprofen. Continue Ancef for now. Will defer further changes to rounding MD. Will continue to monitor closely on med-surg.   Jeryl Columbia, NP-C [facility] Pager [phone]

## 2015-03-20 NOTE — Transfer of Care (Signed)
Immediate Anesthesia Transfer of Care Note  Patient: Nichole Perry  Procedure(s) Performed: Procedure(s): RIGHT AXILLARY LYMPH NODE BIOPSY (Right)  Patient Location: PACU  Anesthesia Type:General  Level of Consciousness: awake and alert   Airway & Oxygen Therapy: Patient Spontanous Breathing and Patient connected to nasal cannula oxygen  Post-op Assessment: Report given to RN and Post -op Vital signs reviewed and stable  Post vital signs: Reviewed and stable  Last Vitals:  Filed Vitals:   03/20/15 1820  BP: 112/67  Pulse: 104  Temp: 36.5 C  Resp: 22    Complications: No apparent anesthesia complications

## 2015-03-20 NOTE — Progress Notes (Addendum)
[facility]                                                                              Patient Demographics  Nichole Perry, is a 44 y.o. female, DOB - 1971/08/27, PJA:[id number]  Admit date - 03/14/2015   Admitting Physician Lavina Hamman, MD  Outpatient Primary MD for the patient is Maximino Greenland, MD  LOS - 6   Chief Complaint  Patient presents with  . Abnormal Lab      Brief history 44 year old female with a history of goiter and anemia presented to the hospital when she was called by her primary care provider with positive blood cultures. The patient has not been feeling well for nearly 3 weeks. The patient initially presented to her primary care provider because of bilateral knee pain and leg swelling on 02/21/2015. She was started on furosemide 20 mg daily. Chest x-ray at that time revealed small to moderate bilateral pleural effusions. CT angio of the chest was performed on 02/21/2015 which was negative for pulmonary embolus but revealed bilateral pleural effusions and central bronchial wall thickening and enlarged right axillary lymph node. She underwent thoracocentesis on 03/03/2015. Fluid chemistries revealed an exudative pleural effusion with WBC 3000 (64% monos, 28% PMN, 8% eosinophils). Routine Fluid culture and AFB culture are negative at this time. 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 The patient followed up on 03/07/2015, and an autoimmune workup was begun which was negative. Again, the patient represented to the primary care provider office on 03/12/2015 with 2 day history of fever up to 101.68F. Blood cultures were obtained at that time are growing CoNS. Notably, the patient also was noted to have a right thyroid nodule which has been biopsied on 03/10/2015. The biopsy revealed benign follicular nodule. Since admission, the patient has continued to have fevers. Workup continues in progress.  Assessment & Plan   Sepsis -Continues to be febrile and had tachypnea and  tachycardia overnight -Secondary to bacteremia ? -Lactic acid 2.22 on 03/14/2015 -Continue IV cefazolin -flu is neg -Tmax 102.24F in past 24hr -continue IVF  Dyspnea  -patient became tachypneic with tachycardia overnight -D-dimer was noted to be elevated, CT chest was conducted showing no pulmonary embolism however moderate bilateral pleural effusions. -Patient was given a dose of Lasix, dyspnea has improved -Currently remains on 2 L oxygen via nasal cannula  Bacteremia-- Coag negative Staph  -unclear presently if true bacteremia vs contaminant as 4/2 repeat blood cultures remain negative to date (had not yet started antibiotics) -may be partly contributing to clinical picture, but doubt is the entire answer -ID following -Echocardiogram: EF 60%, no gross vegetation -follow surveillance blood cultures--neg to date -TEE cannot be done at this time as patient is on airborne precautions -Continue cefazolin (for 14 days)  QuantiFeron Positive/Exudative pleural effusion -03/17/15 repeat thoracocentesis WBC 797 (75% lymphs), glucose 150-- MTB PCR, ADA, glucose, cytology/ culture show no growth -given pt's clinical and epidemiologic hx, will still need to r/o pulmonary TB in setting of lymphocytic effusion -airborne isolation--await sputum although pt is barely coughing and unable to induce sputum -Spoke with Pulm regarding possible VATS (pleural bx)- however no longer available; Spoke with [facility], no longer done there either,  may need CT surg -General surgery consulted and appreciated, possible lymph node biopsy -Spoke with ID (quantiferon + in March 2016), will start TB treatment after LN biopsy and AFB studies   Transaminasemia -Resolved, secondary to sepsis -No abdominal pain, no vomiting -Hepatitis B surface antigen, hepatitis C antibody--negative -HIV antibody is negative  Bilateral pleural effusion -03/03/2015 thoracocentesis--exudative -WBC 3000 (64% monos, 28% PMN, 8%  eosinophils)  Arthralgias and myalgias/ Lower extremity edema and pain -Autoimmune workup has been negative including C3, C4, anti-CCP, ANA, RF, ENA, anti-dsDNA, Anti-SmAb -Lower ext doppler negative for DVT -improving with standard antibiotics -Continues to have swelling in upper ext and knees- improved -Consulted orthopedics- conducted aspiration of right knee at bedside -Right knee fluid culture: no organisms seen; no yeast or fungal elements  Normocytic Anemia -Likely chronic. Hb upon admission 10 -Drop in hb likely dilutional as patient has been on IVF -Will obtain anemia panel and continue to monitor CBC  Code Status: Full  Family Communication: Husband at bedside  Disposition Plan: Admitted Spoke with [facility] and [facility]- had no recommendations and did not feel patient needed to transfer.   Time Spent in minutes   30 minutes  Procedures  Echocardiogram US Thoracentesis Right knee aspiration  Consults   Pulmonology Interventional radiology Infectious disease Orthopedic surgery General surgery  DVT Prophylaxis  SCDs  Lab Results  Component Value Date   PLT 395 03/20/2015    Medications  Scheduled Meds: .  ceFAZolin (ANCEF) IV  2 g Intravenous 3 times per day   Continuous Infusions: . sodium chloride 125 mL/hr at 03/20/15 0523   PRN Meds:.acetaminophen **OR** acetaminophen  Antibiotics    Anti-infectives    Start     Dose/Rate Route Frequency Ordered Stop   03/14/15 1445  ceFAZolin (ANCEF) IVPB 2 g/50 mL premix     2 g 100 mL/hr over 30 Minutes Intravenous 3 times per day 03/14/15 1426     03/14/15 1400  piperacillin-tazobactam (ZOSYN) IVPB 3.375 g  Status:  Discontinued     3.375 g 12.5 mL/hr over 240 Minutes Intravenous 3 times per day 03/14/15 1319 03/14/15 1426   03/14/15 0830  piperacillin-tazobactam (ZOSYN) IVPB 3.375 g  Status:  Discontinued     3.375 g 12.5 mL/hr over 240 Minutes Intravenous Every 8 hours 03/14/15 0217 03/14/15 0651    03/14/15 0230  vancomycin (VANCOCIN) IVPB 750 mg/150 ml premix  Status:  Discontinued     750 mg 150 mL/hr over 60 Minutes Intravenous Every 12 hours 03/14/15 0217 03/16/15 1151   03/14/15 0215  piperacillin-tazobactam (ZOSYN) IVPB 3.375 g     3.375 g 100 mL/hr over 30 Minutes Intravenous  Once 03/14/15 0213 03/14/15 0323        Subjective:   Nichole Perry seen and examined today.  Patient denies current shortness of breath or chest pain. States her knee pain has actually resolved some. Also feels her swelling has decreased.  Objective:   Filed Vitals:   03/19/15 2356 03/20/15 0112 03/20/15 0436 03/20/15 0500  BP: 130/68  105/63   Pulse: 115  89   Temp:  100.8 F (38.2 C) 98.5 F (36.9 C)   TempSrc:  Oral    Resp: 25  18   Height:      Weight:    57.471 kg (126 lb 11.2 oz)  SpO2: 100%  100%     Wt Readings from Last 3 Encounters:  03/20/15 57.471 kg (126 lb 11.2 oz)  03/12/15 52.527 kg (115 lb  12.8 oz)  03/05/15 53.524 kg (118 lb)     Intake/Output Summary (Last 24 hours) at 03/20/15 1146 Last data filed at 03/20/15 0653  Gross per 24 hour  Intake 1812.5 ml  Output      1 ml  Net 1811.5 ml    Exam  General: Well developed, well nourished  Cardiovascular: S1 S2 auscultated,RRR, no murmus  Respiratory: Good air movement, no wheezing or rhonichi  Abdomen: Soft, nontender, nondistended, + bowel sounds  Extremities: trace B/L ankle and hand edema- improved  Neuro: AAOx3, nonfocal  Data Review   Micro Results Recent Results (from the past 240 hour(s))  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 tuberculosis, Direct CANCELED      Comment: Specimen already disgarded from referring [facility]. Will need to recollect patient.  Result canceled by the ancillary    Source (MTBPCR) CANCELED      Comment: Result canceled by the ancillary  Culture, blood (single)     Status: None     Collection Time: 03/12/15  1:25 PM  Result Value Ref Range Status   Culture STAPHYLOCOCCUS SPECIES (COAGULASE NEGATIVE)  Final   Organism ID, Bacteria STAPHYLOCOCCUS SPECIES (COAGULASE NEGATIVE)  Final    Comment: Rifampin and Gentamicin should not be used as single drugs for treatment of Staph infections. This organism DOES NOT demonstrate inducible Clindamycin resistance in vitro. Gram Stain Report Called to,Read Back By and Verified With: DR. Reginia Forts 03/13/15 AT 2300 RIDK       Susceptibility   Staphylococcus species (coagulase negative) -  (no method available)    PENICILLIN 0.25 Resistant     OXACILLIN <=0.25 Sensitive     CEFAZOLIN  Sensitive     GENTAMICIN <=0.5 Sensitive     CIPROFLOXACIN <=0.5 Sensitive     LEVOFLOXACIN <=0.12 Sensitive     TRIMETH/SULFA <=10 Sensitive     VANCOMYCIN 1 Sensitive     CLINDAMYCIN <=0.25 Sensitive     ERYTHROMYCIN >=8 Resistant     RIFAMPIN <=0.5 Sensitive     TETRACYCLINE <=1 Sensitive   Culture, blood (single)     Status: None   Collection Time: 03/12/15  1:25 PM  Result Value Ref Range Status   Organism ID, Bacteria STAPHYLOCOCCUS SPECIES (COAGULASE NEGATIVE)  Final    Comment: Susceptibilities performed on previous culture within the last 5 days. Gram Stain Report Called to,Read Back By and Verified With: DR. Reginia Forts 03/13/15 AT 2230 [place]   Blood Culture (routine x 2)     Status: None   Collection Time: 03/14/15  2:39 AM  Result Value Ref Range Status   Specimen Description BLOOD RIGHT WRIST  Final   Special Requests BOTTLES DRAWN AEROBIC ONLY 4CC  Final   Culture   Final    NO GROWTH 5 DAYS Performed at [facility]    Report Status 03/20/2015 FINAL  Final  Blood Culture (routine x 2)     Status: None   Collection Time: 03/14/15  2:46 AM  Result Value Ref Range Status   Specimen Description BLOOD LEFT ARM  Final   Special Requests BOTTLES DRAWN AEROBIC AND ANAEROBIC 5CC  Final   Culture   Final    NO  GROWTH 5 DAYS Performed at [facility]    Report Status 03/20/2015 FINAL  Final  Urine culture     Status: None  Collection Time: 03/14/15  8:43 AM  Result Value Ref Range Status   Specimen Description URINE, RANDOM  Final   Special Requests NONE  Final   Colony Count   Final    1,000 COLONIES/ML Performed at [facility]    Culture   Final    INSIGNIFICANT GROWTH Performed at [facility]    Report Status 03/16/2015 FINAL  Final  Culture, blood (routine x 2)     Status: None (Preliminary result)   Collection Time: 03/15/15  9:40 AM  Result Value Ref Range Status   Specimen Description BLOOD LEFT ARM  Final   Special Requests BOTTLES DRAWN AEROBIC ONLY 10 CC  Final   Culture   Final           BLOOD CULTURE RECEIVED NO GROWTH TO DATE CULTURE WILL BE HELD FOR 5 DAYS BEFORE ISSUING A FINAL NEGATIVE REPORT Note: Culture results may be compromised due to an excessive volume of blood received in culture bottles. Performed at [facility]    Report Status PENDING  Incomplete  Culture, blood (routine x 2)     Status: None (Preliminary result)   Collection Time: 03/15/15  9:45 AM  Result Value Ref Range Status   Specimen Description BLOOD LEFT WRIST  Final   Special Requests BOTTLES DRAWN AEROBIC ONLY 10 CC  Final   Culture   Final           BLOOD CULTURE RECEIVED NO GROWTH TO DATE CULTURE WILL BE HELD FOR 5 DAYS BEFORE ISSUING A FINAL NEGATIVE REPORT Note: Culture results may be compromised due to an excessive volume of blood received in culture bottles. Performed at [facility]    Report Status PENDING  Incomplete  Body fluid culture     Status: None (Preliminary result)   Collection Time: 03/17/15  2:27 PM  Result Value Ref Range Status   Specimen Description FLUID RIGHT PLEURAL  Final   Special Requests NONE  Final   Gram Stain   Final    RARE WBC PRESENT,BOTH PMN AND MONONUCLEAR NO ORGANISMS SEEN Performed at [facility]    Culture   Final    NO GROWTH 1 DAY Performed at [facility]    Report Status PENDING  Incomplete  Fungus Culture with Smear     Status: None (Preliminary result)   Collection Time: 03/19/15 12:18 PM  Result Value Ref Range Status   Specimen Description FLUID SYNOVIAL RIGHT KNEE  Final   Special Requests Normal  Final   Fungal Smear   Final    NO YEAST OR FUNGAL ELEMENTS SEEN Performed at [facility]    Culture   Final    CULTURE IN PROGRESS FOR FOUR WEEKS Performed at [facility]    Report Status PENDING  Incomplete  Body fluid culture     Status: None (Preliminary result)   Collection Time: 03/19/15 12:19 PM  Result Value Ref Range Status   Specimen Description FLUID SYNOVIAL RIGHT KNEE  Final   Special Requests Normal  Final   Gram Stain   Final    MODERATE WBC PRESENT, PREDOMINANTLY PMN NO ORGANISMS SEEN Performed at [facility]    Culture PENDING  Incomplete   Report Status PENDING  Incomplete    Radiology Reports Dg Chest 1 View  03/17/2015   CLINICAL DATA:  Right sided thoracentesis, status post removal of 580 cc of yellow fluid. Cough.  EXAM: CHEST  1  VIEW  COMPARISON:  03/14/2015  FINDINGS: No pneumothorax. Mild atelectasis medially at the right lung base and the right perihilar region. Prior blunting of the right costophrenic angle resolved. There is a moderate left pleural effusion silhouetting the left heart border.  Borderline enlargement of the cardiopericardial silhouette.  IMPRESSION: 1. Resolution of prior right pleural effusion, with some residual medial right-sided atelectasis. Increase in left pleural effusion, currently moderate in size, with associated passive atelectasis.   Electronically Signed   By: Van Clines M.D.   On: 03/17/2015 14:56   Dg Chest 1 View  03/03/2015   CLINICAL DATA:  Post right thoracentesis  EXAM: CHEST  1 VIEW  COMPARISON:  Chest x-ray of 02/21/2015  FINDINGS: Some of the right  pleural effusion has been evacuated. No obvious pneumothorax is seen, but on the frontal view there is a fluid level at the right lung base and a small right hydro pneumothorax is suspected. Opacity at the left lung base persists consistent with left effusion, atelectasis, and possibly pneumonia. Heart size is stable.  IMPRESSION: 1. Suspect small right hydro pneumothorax after right thoracentesis. 2. Reduction of right pleural effusion.   Electronically Signed   By: Ivar Drape M.D.   On: 03/03/2015 16:17   Dg Chest 2 View  03/13/2015   CLINICAL DATA:  Cough  EXAM: CHEST  2 VIEW  COMPARISON:  March 03, 2015 chest radiograph and chest CT February 21, 2015  FINDINGS: There is a small pleural effusion on the right. No pneumothorax appreciable. There is mild bibasilar atelectasis. Lungs are otherwise clear. Heart size and pulmonary vascularity are normal. No adenopathy. There is evidence of sclerosis in several ribs consistent with prior rib trauma. No acute fracture apparent. Nipple shadows are noted bilaterally.  IMPRESSION: Small right pleural effusion. Mild bibasilar atelectasis. No airspace consolidation or edema.   Electronically Signed   By: Lowella Grip III M.D.   On: 03/13/2015 09:20   Dg Chest 2 View  02/21/2015   CLINICAL DATA:  Chest pain and shortness of breath. Negative pregnancy test.  EXAM: CHEST  2 VIEW  COMPARISON:  12/05/2014  FINDINGS: Lungs are adequately inflated and demonstrate small to moderate bilateral pleural effusions right greater than left which are new. Likely associated atelectasis in the lung bases as basilar infection is also possible person cardiomediastinal silhouette and remainder of the exam is unchanged.  IMPRESSION: Small to moderate bilateral pleural effusions right greater than left. Likely associated bibasilar atelectasis versus infection.   Electronically Signed   By: Marin Olp M.D.   On: 02/21/2015 14:54   Ct Angio Chest Pe W/cm &/or Wo Cm  03/20/2015   CLINICAL  DATA:  Shortness of breath. Tachycardia. Elevated D-dimer.  EXAM: CT ANGIOGRAPHY CHEST WITH CONTRAST  TECHNIQUE: Multidetector CT imaging of the chest was performed using the standard protocol during bolus administration of intravenous contrast. Multiplanar CT image reconstructions and MIPs were obtained to evaluate the vascular anatomy.  CONTRAST:  20mL OMNIPAQUE IOHEXOL 350 MG/ML SOLN  COMPARISON:  02/21/2015  FINDINGS: Technically adequate study with good opacification of the central and segmental pulmonary arteries. No focal filling defects. No evidence of significant pulmonary embolus.  Normal heart size. Normal caliber thoracic aorta without evidence of dissection. Great vessel origins are patent. Esophagus is decompressed. No significant lymphadenopathy in the mediastinum. Again demonstrated is lymphadenopathy in the right axilla with enlarged lymph nodes measuring up to about 15 mm short axis dimension. Large left thyroid gland nodule measuring up to  3.5 cm diameter. No change since prior study.  Moderate size bilateral pleural effusions with atelectasis or consolidation in both lung bases. This is similar to prior study. No pneumothorax.  Included portion of upper abdominal organs is unremarkable except for a small accessory spleen. Degenerative changes in the spine. No destructive bone lesions.  Review of the MIP images confirms the above findings.  IMPRESSION: No evidence of significant pulmonary embolus. Again demonstrated are moderate bilateral pleural effusions with atelectasis or consolidation in the lung bases. Axillary lymphadenopathy. Appearance is nonspecific and follow-up suggested. Left thyroid gland nodule. Follow-up ultrasound has been suggested.   Electronically Signed   By: Lucienne Capers M.D.   On: 03/20/2015 04:28   Ct Angio Chest W/cm &/or Wo Cm  02/21/2015   CLINICAL DATA:  3 day history of bilateral lower extremity edema, cough, and shortness of breath with intermittent stabbing  chest pain radiating to the back.  EXAM: CT ANGIOGRAPHY CHEST WITH CONTRAST  TECHNIQUE: Multidetector CT imaging of the chest was performed using the standard protocol during bolus administration of intravenous contrast. Multiplanar CT image reconstructions and MIPs were obtained to evaluate the vascular anatomy.  CONTRAST:  129mL OMNIPAQUE IOHEXOL 350 MG/ML IV.  COMPARISON:  No prior CT. Two-view chest x-ray earlier same date and 12/05/2014.  FINDINGS: Contrast opacification of the pulmonary arteries is good respiratory motion blurred many of the images of the lung bases. Overall, the study is of good diagnostic quality.  No filling defects within either main pulmonary artery or their branches in either lung to suggest pulmonary embolism. Heart size upper normal. Small pericardial effusion. No visible atherosclerosis involving the thoracic or upper abdominal aorta or their visualized branches. No visible coronary artery atherosclerosis.  Large bilateral pleural effusions including of fluid in the major fissure on the left. Associated dense passive atelectasis in the lower lobes and right middle lobe. No confluent airspace consolidation otherwise. No pulmonary parenchymal nodules or masses. Central airways patent with marked bronchial wall thickening.  Enlarged level 1 and level 2 right axillary lymph nodes, the largest measuring approximately 2.5 x 1.4 cm. No significant mediastinal, hilar or left axillary lymphadenopathy. Mass involving the lower pole of the left lobe of the thyroid gland measuring approximately 4.0 x 3.1 x 4.2 cm.  Visualized upper abdomen unremarkable for the early arterial phase of enhancement. Focus of accessory splenic tissue posterior to the mid spleen. Bone window images demonstrate a likely benign sclerotic bone island in the T6 vertebral body.  Review of the MIP images confirms the above findings.  IMPRESSION: 1. No evidence of pulmonary embolism. 2. Large bilateral pleural effusions  including fluid in the major fissure on the left with associated passive atelectasis in the lower lobes and right middle lobe. 3. Marked central bronchial wall thickening consistent with severe bronchitis and/or asthma. 4. Small pericardial effusion. 5. Enlarged right axillary lymph nodes without evidence of lymphadenopathy elsewhere. Isolated unilateral axillary lymphadenopathy can be seen in breast cancer, so if not recently performed, diagnostic mammography may be helpful in further evaluation. 6. Approximate 4 cm mass involving the lower pole of the left lobe of the thyroid gland. Based on size, evaluation with ultrasound and likely biopsy is indicated. This follows ACR consensus guidelines: Managing Incidental Thyroid Nodules Detected on Imaging: White Paper of the ACR Incidental Thyroid Findings Committee. J Am Coll Radiol 2015; 12:143-150.   Electronically Signed   By: Evangeline Dakin M.D.   On: 02/21/2015 19:06   US Soft Tissue Head/neck  02/25/2015   CLINICAL DATA:  Thyroid nodule.  EXAM: THYROID ULTRASOUND  TECHNIQUE: Ultrasound examination of the thyroid gland and adjacent soft tissues was performed.  COMPARISON:  Chest CT 02/21/2015  FINDINGS: Right thyroid lobe  Measurements: 5.8 x 1.8 x 1.9 cm. There is a subtle heterogeneous nodule along the medial right thyroid lobe that measures 0.6 x 0.3 x 0.7 cm. There is a heterogeneous nodule in the inferior right thyroid lobe that measures 1.0 x 0.5 x 0.8 cm.  Left thyroid lobe  Measurements: 7.1 x 2.7 x 2.6 cm. There is a dominant nodule occupying the mid and lower aspect of the left thyroid lobe. This lesion corresponds with the nodule seen on the recent CT. This dominant left thyroid nodule measures 4.7 x 2.5 x 4.1 cm. Nodule is heterogeneous with small cystic areas. Cannot exclude small calcifications in this nodule.  Isthmus  Thickness: 0.4 cm.  No nodules visualized.  Lymphadenopathy  None visualized.  IMPRESSION: Bilateral thyroid nodules. There is a  dominant left thyroid nodule that measures up to 4.7 cm. Findings meet consensus criteria for biopsy. Ultrasound-guided fine needle aspiration should be considered, as per the consensus statement: Management of Thyroid Nodules Detected at [place]: Society of Radiologists in [place]. Radiology 2005; [id number].   Electronically Signed   By: Markus Daft M.D.   On: 02/25/2015 16:21   US Thyroid Biopsy  03/10/2015   CLINICAL DATA:  44 year old female with a history of left-sided thyroid nodule. She has been referred for percutaneous biopsy.  EXAM: ULTRASOUND GUIDED NEEDLE ASPIRATE BIOPSY OF THE THYROID GLAND  COMPARISON:  02/25/2015  PROCEDURE: The procedure, risks, benefits, and alternatives were explained to the patient. Questions regarding the procedure were encouraged and answered. The patient understands and consents to the procedure.  Ultrasound survey was performed with images stored and sent to PACs.  The left neck was prepped with Betadine in a sterile fashion, and a sterile drape was applied covering the operative field. A sterile gown and sterile gloves were used for the procedure. Local anesthesia was provided with 1% Lidocaine.  Ultrasound guidance was used to infiltrate the region with 1% lidocaine for local anesthesia. Four separate 25 gauge fine needle biopsy were then acquired of the left thyroid nodule using ultrasound guidance. Images were stored.  Slide preparation was performed.  Final image was stored after biopsy.  Patient tolerated the procedure well and remained hemodynamically stable throughout.  No complications were encountered and no significant blood loss was encounter  FINDINGS: Ultrasound survey of the left thyroid demonstrates heterogeneous nodule inferiorly.  Images during the case demonstrate needle tip within the nodule on each needle pass.  Final ultrasound image demonstrates no complicating features.  IMPRESSION: Status post ultrasound-guided biopsy  of left inferior thyroid nodule. Specimen sent to pathology for complete histopathologic analysis.  Signed,  Dulcy Fanny. Earleen Newport, DO  [facility]  [facility]   Electronically Signed   By: Corrie Mckusick D.O.   On: 03/10/2015 14:44   Dg Chest Port 1 View  03/20/2015   CLINICAL DATA:  Walking Corpse syndrome  EXAM: PORTABLE CHEST - 1 VIEW  COMPARISON:  03/17/2015  FINDINGS: The cardiac enlargement with pulmonary vascular congestion and perihilar edema, increasing since prior study. Small to moderate left pleural effusion is also increasing. No pneumothorax.  IMPRESSION: Increasing pulmonary vascular congestion, edema, and left pleural effusion since prior study.   Electronically Signed   By: Lucienne Capers M.D.   On:  03/20/2015 00:11   Dg Chest Port 1 View  03/14/2015   CLINICAL DATA:  Abnormal labs, suspicious for infection.  EXAM: PORTABLE CHEST - 1 VIEW  COMPARISON:  03/12/2015  FINDINGS: There is persistent right pleural fluid or thickening in the lateral base. Mild linear basilar opacities persist and may represent scarring. Lungs are otherwise clear. Hilar, mediastinal and cardiac contours are unremarkable and unchanged. Pulmonary vasculature is normal.  IMPRESSION: Unchanged, would minimal linear basilar opacities and right lateral pleural fluid/thickening.   Electronically Signed   By: Andreas Newport M.D.   On: 03/14/2015 03:09   Dg Knee Complete 4 Views Right  03/05/2015   CLINICAL DATA:  Acute onset of right knee pain and swelling. Initial encounter.  EXAM: RIGHT KNEE - COMPLETE 4+ VIEW  COMPARISON:  None.  FINDINGS: There is no evidence of fracture or dislocation. The joint spaces are preserved. No significant degenerative change is seen; the patellofemoral joint is grossly unremarkable in appearance.  A small knee joint effusion is noted. The visualized soft tissues are otherwise unremarkable.  IMPRESSION: 1. No evidence of fracture or dislocation. 2.  Small knee joint effusion noted.   Electronically Signed   By: Garald Balding M.D.   On: 03/05/2015 22:31   Dg Hand Complete Right  03/05/2015   CLINICAL DATA:  Bilateral hand pain and swelling. Initial encounter.  EXAM: RIGHT HAND - COMPLETE 3+ VIEW  COMPARISON:  None.  FINDINGS: There is no evidence of fracture or dislocation. The joint spaces are preserved. The carpal rows are intact, and demonstrate normal alignment. Mild dorsal soft tissue swelling is noted about the wrist.  IMPRESSION: No evidence of fracture or dislocation.   Electronically Signed   By: Garald Balding M.D.   On: 03/05/2015 22:30   Dg Knee Ap/lat W/sunrise Left  03/19/2015   CLINICAL DATA:  Pain and swelling.  No appreciable trauma  EXAM: LEFT KNEE 3 VIEWS  COMPARISON:  None.  FINDINGS: Frontal, lateral, and sunrise patellar images were obtained. There is no fracture or dislocation. Joint spaces appear intact. No effusion. No erosive change.  IMPRESSION: No fracture or effusion.  No appreciable arthropathy.   Electronically Signed   By: Lowella Grip III M.D.   On: 03/19/2015 13:55   Dg Knee Ap/lat W/sunrise Right  03/19/2015   CLINICAL DATA:  Pain and swelling.  No history of trauma  EXAM: RIGHT KNEE 3 VIEWS  COMPARISON:  March 05, 2015  FINDINGS: Frontal, lateral, and sunrise patellar images were obtained. No fracture or dislocation. No appreciable joint effusion. Joint spaces appear intact. No erosive change.  IMPRESSION: No fracture or effusion.  No appreciable arthropathy.   Electronically Signed   By: Lowella Grip III M.D.   On: 03/19/2015 13:57   US Thoracentesis Asp Pleural Space W/img Guide  03/17/2015   INDICATION: Symptomatic R sided pleural effusion  EXAM: US THORACENTESIS ASP PLEURAL SPACE W/IMG GUIDE  COMPARISON:  None.  MEDICATIONS: 10 cc 1% lidocaine  COMPLICATIONS: None immediate  TECHNIQUE: Informed written consent was obtained from the patient after a discussion of the risks, benefits and alternatives to  treatment. A timeout was performed prior to the initiation of the procedure.  Initial ultrasound scanning demonstrates a right pleural effusion. The lower chest was prepped and draped in the usual sterile fashion. 1% lidocaine was used for local anesthesia.  Under direct ultrasound guidance, a 19 gauge, 7-cm, Yueh catheter was introduced. An ultrasound image was saved for documentation purposes. the thoracentesis was  performed. The catheter was removed and a dressing was applied. The patient tolerated the procedure well without immediate post procedural complication. The patient was escorted to have an upright chest radiograph.  FINDINGS: A total of approximately 580 cc of yellow fluid was removed. Requested samples were sent to the laboratory.  IMPRESSION: Successful ultrasound-guided R sided thoracentesis yielding 580 cc of pleural fluid.  Read by:  Lavonia Drafts [facility]   Electronically Signed   By: Sandi Mariscal M.D.   On: 03/17/2015 15:13   US Thoracentesis Asp Pleural Space W/img Guide  03/03/2015   INDICATION: Dyspnea, bilateral pleural effusions; request is made for diagnostic and therapeutic right thoracentesis.  EXAM: ULTRASOUND GUIDED DIAGNOSTIC AND THERAPEUTIC RIGHT THORACENTESIS  COMPARISON:  None.  MEDICATIONS: None  COMPLICATIONS: None immediate  TECHNIQUE: Informed written consent was obtained from the patient via interpreter after a discussion of the risks, benefits and alternatives to treatment. A timeout was performed prior to the initiation of the procedure.  Initial ultrasound scanning demonstrates a pleural effusion. The lower chest was prepped and draped in the usual sterile fashion. 1% lidocaine was used for local anesthesia.  An ultrasound image was saved for documentation purposes. A 6 Fr Safe-T-Centesis catheter was introduced. The thoracentesis was performed. The catheter was removed and a dressing was applied. The patient tolerated the procedure well without immediate post procedural  complication. The patient was escorted to have an upright chest radiograph.  FINDINGS: A total of approximately 360 cc's of slightly turbid ,yellow fluid was removed. Requested samples were sent to the laboratory.  IMPRESSION: Successful ultrasound-guided diagnostic and therapeutic right sided thoracentesis yielding 360 cc's of pleural fluid.  Read by: Rowe Robert, PA-C   Electronically Signed   By: Markus Daft M.D.   On: 03/03/2015 16:41    CBC  Recent Labs Lab 03/14/15 0239 03/15/15 1430 03/16/15 0745 03/19/15 0612 03/20/15 0509  WBC 4.3 3.8* 4.2 4.2 4.7  HGB 10.2* 8.3* 8.3* 8.0* 8.2*  HCT 31.8* 24.9* 25.4* 24.5* 25.6*  PLT 522* 434* 423* 417* 395  MCV 83.7 82.5 82.7 83.6 83.9  MCH 26.8 27.5 27.0 27.3 26.9  MCHC 32.1 33.3 32.7 32.7 32.0  RDW 13.1 13.2 13.4 13.8 14.0  LYMPHSABS 1.1 1.0  --   --   --   MONOABS 0.4 0.3  --   --   --   EOSABS 0.2 0.1  --   --   --   BASOSABS 0.0 0.0  --   --   --     Chemistries   Recent Labs Lab 03/14/15 0239 03/15/15 0940 03/16/15 0745 03/19/15 0612 03/20/15 0509  NA 137 134* 138 136 138  K 4.7 3.8 3.6 3.7 3.6  CL 105 107 108 106 106  CO2 21 20 22 21 23   GLUCOSE 98 166* 111* 124* 114*  BUN 8 <5* <5* <5* <5*  CREATININE 0.56 0.66 0.55 0.50 0.46*  CALCIUM 8.8 8.1* 8.1* 7.9* 8.4  AST 61* 41* 37 24  --   ALT 58* 28 24 14   --   ALKPHOS 114 71 68 63  --   BILITOT 1.2 0.5 0.2* 0.3  --    ------------------------------------------------------------------------------------------------------------------ estimated creatinine clearance is 71.7 mL/min (by C-G formula based on Cr of 0.46). ------------------------------------------------------------------------------------------------------------------ No results for input(s): HGBA1C in the last 72 hours. ------------------------------------------------------------------------------------------------------------------  Recent Labs  03/17/15 1730  CHOL 106  TRIG 74    ------------------------------------------------------------------------------------------------------------------ No results for input(s): TSH, T4TOTAL, T3FREE, THYROIDAB in the last  72 hours.  Invalid input(s): FREET3 ------------------------------------------------------------------------------------------------------------------ No results for input(s): VITAMINB12, FOLATE, FERRITIN, TIBC, IRON, RETICCTPCT in the last 72 hours.  Coagulation profile No results for input(s): INR, PROTIME in the last 168 hours.   Recent Labs  03/20/15 0009  DDIMER 7.80*    Cardiac Enzymes No results for input(s): CKMB, TROPONINI, MYOGLOBIN in the last 168 hours.  Invalid input(s): CK ------------------------------------------------------------------------------------------------------------------ Invalid input(s): POCBNP    Martina Brodbeck D.O. on 03/20/2015 at 11:46 AM  Between 7am to 7pm - Pager - [phone]  After 7pm go to [email or web address] - password [email or web address]  And look for the night coverage person covering for me after hours  [facility] Office  [phone]

## 2015-03-20 NOTE — Anesthesia Procedure Notes (Signed)
Procedure Name: Intubation Date/Time: 03/20/2015 5:16 PM Performed by: Ollen Bowl Pre-anesthesia Checklist: Patient identified, Emergency Drugs available, Suction available, Patient being monitored and Timeout performed Patient Re-evaluated:Patient Re-evaluated prior to inductionOxygen Delivery Method: Circle system utilized and Simple face mask Preoxygenation: Pre-oxygenation with 100% oxygen Intubation Type: IV induction Ventilation: Mask ventilation without difficulty Laryngoscope Size: Miller and 2 Grade View: Grade I Tube type: Oral Tube size: 7.5 mm Number of attempts: 1 Airway Equipment and Method: Patient positioned with wedge pillow and Stylet Placement Confirmation: ETT inserted through vocal cords under direct vision,  positive ETCO2 and breath sounds checked- equal and bilateral Tube secured with: Tape Dental Injury: Teeth and Oropharynx as per pre-operative assessment

## 2015-03-20 NOTE — Anesthesia Preprocedure Evaluation (Addendum)
Anesthesia Evaluation  Patient identified by MRN, date of birth, ID band Patient awake  General Assessment Comment:Speaks lil english, husband avail to assist  Reviewed: Allergy & Precautions, NPO status , Patient's Chart, lab work & pertinent test results  Airway Mallampati: II  TM Distance: >3 FB Neck ROM: Full    Dental  (+) Teeth Intact, Dental Advisory Given   Pulmonary  breath sounds clear to auscultation        Cardiovascular Rhythm:Regular Rate:Normal     Neuro/Psych    GI/Hepatic   Endo/Other    Renal/GU      Musculoskeletal   Abdominal   Peds  Hematology  (+) anemia ,   Anesthesia Other Findings   Reproductive/Obstetrics                            Anesthesia Physical Anesthesia Plan  ASA: III  Anesthesia Plan: General   Post-op Pain Management:    Induction: Intravenous  Airway Management Planned: Oral ETT  Additional Equipment:   Intra-op Plan:   Post-operative Plan: Extubation in OR  Informed Consent: I have reviewed the patients History and Physical, chart, labs and discussed the procedure including the risks, benefits and alternatives for the proposed anesthesia with the patient or authorized representative who has indicated his/her understanding and acceptance.   Dental advisory given  Plan Discussed with: CRNA and Anesthesiologist  Anesthesia Plan Comments:         Anesthesia Quick Evaluation

## 2015-03-20 NOTE — Progress Notes (Signed)
INFECTIOUS DISEASE PROGRESS NOTE  ID: Brandolyn Shortridge is a 44 y.o. 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 Joint aches.   Abtx:  Anti-infectives    Start     Dose/Rate Route Frequency Ordered Stop   03/14/15 1445  ceFAZolin (ANCEF) IVPB 2 g/50 mL premix     2 g 100 mL/hr over 30 Minutes Intravenous 3 times per day 03/14/15 1426     03/14/15 1400  piperacillin-tazobactam (ZOSYN) IVPB 3.375 g  Status:  Discontinued     3.375 g 12.5 mL/hr over 240 Minutes Intravenous 3 times per day 03/14/15 1319 03/14/15 1426   03/14/15 0830  piperacillin-tazobactam (ZOSYN) IVPB 3.375 g  Status:  Discontinued     3.375 g 12.5 mL/hr over 240 Minutes Intravenous Every 8 hours 03/14/15 0217 03/14/15 0651   03/14/15 0230  vancomycin (VANCOCIN) IVPB 750 mg/150 ml premix  Status:  Discontinued     750 mg 150 mL/hr over 60 Minutes Intravenous Every 12 hours 03/14/15 0217 03/16/15 1151   03/14/15 0215  piperacillin-tazobactam (ZOSYN) IVPB 3.375 g     3.375 g 100 mL/hr over 30 Minutes Intravenous  Once 03/14/15 0213 03/14/15 0323      Medications:  Scheduled: .  ceFAZolin (ANCEF) IV  2 g Intravenous 3 times per day    Objective: Vital signs in last 24 hours: Temp:  [98.5 F (36.9 C)-102.7 F (39.3 C)] 98.5 F (36.9 C) (04/08 0436) Pulse Rate:  [86-115] 89 (04/08 0436) Resp:  [18-32] 18 (04/08 0436) BP: (101-130)/(57-76) 105/63 mmHg (04/08 0436) SpO2:  [100 %] 100 % (04/08 0436) Weight:  [57.471 kg (126 lb 11.2 oz)] 57.471 kg (126 lb 11.2 oz) (04/08 0500)   General appearance: alert, cooperative and no distress Resp: rhonchi  bilaterally and mild Cardio: regular rate and rhythm GI: normal findings: bowel sounds normal and soft, non-tender  Lab Results  Recent Labs  03/19/15 0612 03/20/15 0509  WBC 4.2 4.7  HGB 8.0* 8.2*  HCT 24.5* 25.6*  NA 136 138  K 3.7 3.6  CL 106 106  CO2 21 23  BUN <5* <5*  CREATININE 0.50 0.46*   Liver Panel  Recent Labs  03/19/15 0612  PROT 5.3*  ALBUMIN 1.6*  AST 24  ALT 14  ALKPHOS 63  BILITOT 0.3   Sedimentation Rate  Recent Labs  03/17/15 1838  ESRSEDRATE 86*   C-Reactive Protein  Recent Labs  03/17/15 1838  CRP 19.8*    Microbiology: Recent Results (from the past 240 hour(s))  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 tuberculosis, Direct CANCELED      Comment: Specimen already disgarded from referring Accession. Will need to recollect patient.  Result canceled by the ancillary    Source (MTBPCR) CANCELED      Comment: Result canceled by the ancillary  Culture, blood (single)     Status: None   Collection Time: 03/12/15  1:25 PM  Result Value Ref Range Status   Culture STAPHYLOCOCCUS SPECIES (COAGULASE  NEGATIVE)  Final   Organism ID, Bacteria STAPHYLOCOCCUS SPECIES (COAGULASE NEGATIVE)  Final    Comment: Rifampin and Gentamicin should not be used as single drugs for treatment of Staph infections. This organism DOES NOT demonstrate inducible Clindamycin resistance in vitro. Gram Stain Report Called to,Read Back By and Verified With: DR. Reginia Forts 03/13/15 AT 2300 RIDK       Susceptibility   Staphylococcus species (coagulase negative) -  (no method available)    PENICILLIN 0.25 Resistant     OXACILLIN <=0.25 Sensitive     CEFAZOLIN  Sensitive     GENTAMICIN <=0.5 Sensitive     CIPROFLOXACIN <=0.5 Sensitive     LEVOFLOXACIN <=0.12 Sensitive     TRIMETH/SULFA <=10 Sensitive     VANCOMYCIN 1 Sensitive     CLINDAMYCIN <=0.25 Sensitive     ERYTHROMYCIN  >=8 Resistant     RIFAMPIN <=0.5 Sensitive     TETRACYCLINE <=1 Sensitive   Culture, blood (single)     Status: None   Collection Time: 03/12/15  1:25 PM  Result Value Ref Range Status   Organism ID, Bacteria STAPHYLOCOCCUS SPECIES (COAGULASE NEGATIVE)  Final    Comment: Susceptibilities performed on previous culture within the last 5 days. Gram Stain Report Called to,Read Back By and Verified With: DR. Reginia Forts 03/13/15 AT 2230 [place]   Blood Culture (routine x 2)     Status: None   Collection Time: 03/14/15  2:39 AM  Result Value Ref Range Status   Specimen Description BLOOD RIGHT WRIST  Final   Special Requests BOTTLES DRAWN AEROBIC ONLY 4CC  Final   Culture   Final    NO GROWTH 5 DAYS Performed at [facility]    Report Status 03/20/2015 FINAL  Final  Blood Culture (routine x 2)     Status: None   Collection Time: 03/14/15  2:46 AM  Result Value Ref Range Status   Specimen Description BLOOD LEFT ARM  Final   Special Requests BOTTLES DRAWN AEROBIC AND ANAEROBIC 5CC  Final   Culture   Final    NO GROWTH 5 DAYS Performed at [facility]    Report Status 03/20/2015 FINAL  Final  Urine culture     Status: None   Collection Time: 03/14/15  8:43 AM  Result Value Ref Range Status   Specimen Description URINE, RANDOM  Final   Special Requests NONE  Final   Colony Count   Final    1,000 COLONIES/ML Performed at [facility]    Culture   Final    INSIGNIFICANT GROWTH Performed at [facility]    Report Status 03/16/2015 FINAL  Final  Culture, blood (routine x 2)     Status: None (Preliminary result)   Collection Time: 03/15/15  9:40 AM  Result Value Ref Range Status   Specimen Description BLOOD LEFT ARM  Final   Special Requests BOTTLES DRAWN AEROBIC ONLY 10 CC  Final   Culture   Final           BLOOD CULTURE RECEIVED NO GROWTH TO DATE CULTURE WILL BE HELD FOR 5 DAYS BEFORE ISSUING A FINAL NEGATIVE REPORT Note: Culture results may be  compromised due to an excessive volume of blood received in culture bottles. Performed at [facility]    Report Status PENDING  Incomplete  Culture, blood (routine x 2)     Status: None (Preliminary result)   Collection Time: 03/15/15  9:45 AM  Result Value Ref  Range Status   Specimen Description BLOOD LEFT WRIST  Final   Special Requests BOTTLES DRAWN AEROBIC ONLY 10 CC  Final   Culture   Final           BLOOD CULTURE RECEIVED NO GROWTH TO DATE CULTURE WILL BE HELD FOR 5 DAYS BEFORE ISSUING A FINAL NEGATIVE REPORT Note: Culture results may be compromised due to an excessive volume of blood received in culture bottles. Performed at [facility]    Report Status PENDING  Incomplete  Body fluid culture     Status: None (Preliminary result)   Collection Time: 03/17/15  2:27 PM  Result Value Ref Range Status   Specimen Description FLUID RIGHT PLEURAL  Final   Special Requests NONE  Final   Gram Stain   Final    RARE WBC PRESENT,BOTH PMN AND MONONUCLEAR NO ORGANISMS SEEN Performed at [facility]    Culture   Final    NO GROWTH 1 DAY Performed at [facility]    Report Status PENDING  Incomplete  Fungus Culture with Smear     Status: None (Preliminary result)   Collection Time: 03/19/15 12:18 PM  Result Value Ref Range Status   Specimen Description FLUID SYNOVIAL RIGHT KNEE  Final   Special Requests Normal  Final   Fungal Smear   Final    NO YEAST OR FUNGAL ELEMENTS SEEN Performed at [facility]    Culture   Final    CULTURE IN PROGRESS FOR FOUR WEEKS Performed at [facility]    Report Status PENDING  Incomplete  Body fluid culture     Status: None (Preliminary result)   Collection Time: 03/19/15 12:19 PM  Result Value Ref Range Status   Specimen Description FLUID SYNOVIAL RIGHT KNEE  Final   Special Requests Normal  Final   Gram Stain   Final    MODERATE WBC PRESENT, PREDOMINANTLY PMN NO ORGANISMS SEEN Performed at  [facility]    Culture   Final    NO GROWTH 1 DAY Performed at [facility]    Report Status PENDING  Incomplete    Studies/Results: Ct Angio Chest Pe W/cm &/or Wo Cm  03/20/2015   CLINICAL DATA:  Shortness of breath. Tachycardia. Elevated D-dimer.  EXAM: CT ANGIOGRAPHY CHEST WITH CONTRAST  TECHNIQUE: Multidetector CT imaging of the chest was performed using the standard protocol during bolus administration of intravenous contrast. Multiplanar CT image reconstructions and MIPs were obtained to evaluate the vascular anatomy.  CONTRAST:  68mL OMNIPAQUE IOHEXOL 350 MG/ML SOLN  COMPARISON:  02/21/2015  FINDINGS: Technically adequate study with good opacification of the central and segmental pulmonary arteries. No focal filling defects. No evidence of significant pulmonary embolus.  Normal heart size. Normal caliber thoracic aorta without evidence of dissection. Great vessel origins are patent. Esophagus is decompressed. No significant lymphadenopathy in the mediastinum. Again demonstrated is lymphadenopathy in the right axilla with enlarged lymph nodes measuring up to about 15 mm short axis dimension. Large left thyroid gland nodule measuring up to 3.5 cm diameter. No change since prior study.  Moderate size bilateral pleural effusions with atelectasis or consolidation in both lung bases. This is similar to prior study. No pneumothorax.  Included portion of upper abdominal organs is unremarkable except for a small accessory spleen. Degenerative changes in the spine. No destructive bone lesions.  Review of the MIP images confirms the above findings.  IMPRESSION: No evidence of significant pulmonary embolus. Again demonstrated  are moderate bilateral pleural effusions with atelectasis or consolidation in the lung bases. Axillary lymphadenopathy. Appearance is nonspecific and follow-up suggested. Left thyroid gland nodule. Follow-up ultrasound has been suggested.   Electronically Signed   By:  Lucienne Capers M.D.   On: 03/20/2015 04:28   Dg Chest Port 1 View  03/20/2015   CLINICAL DATA:  Walking Corpse syndrome  EXAM: PORTABLE CHEST - 1 VIEW  COMPARISON:  03/17/2015  FINDINGS: The cardiac enlargement with pulmonary vascular congestion and perihilar edema, increasing since prior study. Small to moderate left pleural effusion is also increasing. No pneumothorax.  IMPRESSION: Increasing pulmonary vascular congestion, edema, and left pleural effusion since prior study.   Electronically Signed   By: Lucienne Capers M.D.   On: 03/20/2015 00:11   Dg Knee Ap/lat W/sunrise Left  03/19/2015   CLINICAL DATA:  Pain and swelling.  No appreciable trauma  EXAM: LEFT KNEE 3 VIEWS  COMPARISON:  None.  FINDINGS: Frontal, lateral, and sunrise patellar images were obtained. There is no fracture or dislocation. Joint spaces appear intact. No effusion. No erosive change.  IMPRESSION: No fracture or effusion.  No appreciable arthropathy.   Electronically Signed   By: Lowella Grip III M.D.   On: 03/19/2015 13:55   Dg Knee Ap/lat W/sunrise Right  03/19/2015   CLINICAL DATA:  Pain and swelling.  No history of trauma  EXAM: RIGHT KNEE 3 VIEWS  COMPARISON:  March 05, 2015  FINDINGS: Frontal, lateral, and sunrise patellar images were obtained. No fracture or dislocation. No appreciable joint effusion. Joint spaces appear intact. No erosive change.  IMPRESSION: No fracture or effusion.  No appreciable arthropathy.   Electronically Signed   By: Lowella Grip III M.D.   On: 03/19/2015 13:57     Assessment/Plan: FUO CNS bacteremia- MSSE  Repeat off anbx ngtd LAN, pleural effusion  Arthritis Quantiferon + [place]  Total days of antibiotics: 6 ancef  For LN bx today.  Await results of this bx, prior to changing therapy Spoke with surgery,  hopefully get routine Cx and AFB Cx as well as path stains.  Again, not clear that MSSE is true pathogen.  Will f/u over weekend.          Bobby Rumpf Infectious Diseases (pager) [phone] [email or web address] 03/20/2015, 1:21 PM  LOS: 6 days

## 2015-03-21 DIAGNOSIS — R0602 Shortness of breath: Secondary | ICD-10-CM

## 2015-03-21 DIAGNOSIS — E43 Unspecified severe protein-calorie malnutrition: Secondary | ICD-10-CM

## 2015-03-21 DIAGNOSIS — A150 Tuberculosis of lung: Secondary | ICD-10-CM

## 2015-03-21 DIAGNOSIS — R7989 Other specified abnormal findings of blood chemistry: Secondary | ICD-10-CM | POA: Insufficient documentation

## 2015-03-21 DIAGNOSIS — R791 Abnormal coagulation profile: Secondary | ICD-10-CM

## 2015-03-21 LAB — IRON AND TIBC
Iron: 13 ug/dL — ABNORMAL LOW (ref 42–145)
SATURATION RATIOS: 9 % — AB (ref 20–55)
TIBC: 137 ug/dL — AB (ref 250–470)
UIBC: 124 ug/dL — ABNORMAL LOW (ref 125–400)

## 2015-03-21 LAB — BODY FLUID CULTURE: Culture: NO GROWTH

## 2015-03-21 LAB — CULTURE, BLOOD (ROUTINE X 2)
CULTURE: NO GROWTH
Culture: NO GROWTH

## 2015-03-21 LAB — CBC
HCT: 25.1 % — ABNORMAL LOW (ref 36.0–46.0)
Hemoglobin: 8 g/dL — ABNORMAL LOW (ref 12.0–15.0)
MCH: 26.8 pg (ref 26.0–34.0)
MCHC: 31.9 g/dL (ref 30.0–36.0)
MCV: 84.2 fL (ref 78.0–100.0)
Platelets: 425 10*3/uL — ABNORMAL HIGH (ref 150–400)
RBC: 2.98 MIL/uL — ABNORMAL LOW (ref 3.87–5.11)
RDW: 14 % (ref 11.5–15.5)
WBC: 3.8 10*3/uL — ABNORMAL LOW (ref 4.0–10.5)

## 2015-03-21 LAB — RETICULOCYTES
RBC.: 2.98 MIL/uL — ABNORMAL LOW (ref 3.87–5.11)
RETIC COUNT ABSOLUTE: 62.6 10*3/uL (ref 19.0–186.0)
Retic Ct Pct: 2.1 % (ref 0.4–3.1)

## 2015-03-21 LAB — BASIC METABOLIC PANEL
Anion gap: 10 (ref 5–15)
BUN: <5 mg/dL — ABNORMAL LOW (ref 6–23)
CO2: 23 mmol/L (ref 19–32)
Calcium: 7.9 mg/dL — ABNORMAL LOW (ref 8.4–10.5)
Chloride: 105 mmol/L (ref 96–112)
Creatinine, Ser: 0.54 mg/dL (ref 0.50–1.10)
GLUCOSE: 95 mg/dL (ref 70–99)
Potassium: 3.4 mmol/L — ABNORMAL LOW (ref 3.5–5.1)
Sodium: 138 mmol/L (ref 135–145)

## 2015-03-21 MED ORDER — ETHAMBUTOL HCL 100 MG PO TABS
15.0000 mg/kg | ORAL_TABLET | Freq: Every day | ORAL | Status: DC
Start: 2015-03-21 — End: 2015-03-26
  Administered 2015-03-21 – 2015-03-26 (×6): 850 mg via ORAL
  Filled 2015-03-21 (×7): qty 0.5

## 2015-03-21 MED ORDER — PYRAZINAMIDE 500 MG PO TABS
1000.0000 mg | ORAL_TABLET | Freq: Every day | ORAL | Status: DC
  Administered 2015-03-21 – 2015-03-26 (×6): 1000 mg via ORAL
  Filled 2015-03-21 (×7): qty 2

## 2015-03-21 MED ORDER — BOOST / RESOURCE BREEZE PO LIQD
1.0000 | Freq: Three times a day (TID) | ORAL | Status: DC
  Administered 2015-03-21 – 2015-03-22 (×3): 1 via ORAL

## 2015-03-21 MED ORDER — HYDROCODONE-ACETAMINOPHEN 5-325 MG PO TABS: 1.0000 | ORAL_TABLET | ORAL | Status: DC | PRN

## 2015-03-21 MED ORDER — RIFAMPIN 300 MG PO CAPS
600.0000 mg | ORAL_CAPSULE | Freq: Every day | ORAL | Status: DC
  Administered 2015-03-21 – 2015-03-26 (×6): 600 mg via ORAL
  Filled 2015-03-21 (×6): qty 2

## 2015-03-21 MED ORDER — ISONIAZID 300 MG PO TABS
300.0000 mg | ORAL_TABLET | Freq: Every day | ORAL | Status: DC
  Administered 2015-03-21 – 2015-03-26 (×6): 300 mg via ORAL
  Filled 2015-03-21 (×7): qty 1

## 2015-03-21 MED ORDER — VITAMIN B-6 50 MG PO TABS
50.0000 mg | ORAL_TABLET | Freq: Every day | ORAL | Status: DC
  Administered 2015-03-21 – 2015-03-26 (×6): 50 mg via ORAL
  Filled 2015-03-21 (×7): qty 1

## 2015-03-21 MED ORDER — ADULT MULTIVITAMIN W/MINERALS CH
1.0000 | ORAL_TABLET | Freq: Every day | ORAL | Status: DC
  Administered 2015-03-21 – 2015-03-26 (×6): 1 via ORAL
  Filled 2015-03-21 (×6): qty 1

## 2015-03-21 NOTE — Anesthesia Postprocedure Evaluation (Signed)
  Anesthesia Post-op Note  Patient: Publishing rights manager  Procedure(s) Performed: Procedure(s): RIGHT AXILLARY LYMPH NODE BIOPSY (Right)  Patient Location: PACU  Anesthesia Type:General  Level of Consciousness: awake, alert  and oriented  Airway and Oxygen Therapy: Patient Spontanous Breathing and Patient connected to nasal cannula oxygen  Post-op Pain: mild  Post-op Assessment: Post-op Vital signs reviewed, Patient's Cardiovascular Status Stable, Respiratory Function Stable, Patent Airway, No signs of Nausea or vomiting and Pain level controlled  Post-op Vital Signs: stable  Last Vitals:  Filed Vitals:   03/21/15 0601  BP: 104/66  Pulse: 92  Temp: 37.9 C  Resp: 17    Complications: No apparent anesthesia complications

## 2015-03-21 NOTE — Progress Notes (Signed)
1 Day Post-Op  Subjective: Doing well after biopsy.  Objective: Vital signs in last 24 hours: Temp:  [97.4 F (36.3 C)-101 F (38.3 C)] 98.9 F (37.2 C) (04/09 0750) Pulse Rate:  [87-120] 92 (04/09 0601) Resp:  [16-35] 17 (04/09 0601) BP: (100-123)/(66-73) 104/66 mmHg (04/09 0601) SpO2:  [100 %] 100 % (04/09 0601) Weight:  [57.199 kg (126 lb 1.6 oz)] 57.199 kg (126 lb 1.6 oz) (04/09 0500) Last BM Date: 03/14/15 Regular diet Tm 101 yesterday and 100.3 last PM VSS K+ 3.4/WbC 3.8/ H/H= 8/25  Intake/Output from previous day: 04/08 0701 - 04/09 0700 In: 3446.7 [P.O.:75; I.V.:3221.7; IV Piggyback:150] Out: 4 [Urine:4] Intake/Output this shift:    General appearance: alert, cooperative and no distress Incision/Wound:  Site looks fine.  Dressing is dry, subcutaneous  closure  Lab Results:   Recent Labs  03/20/15 0509 03/21/15 0540  WBC 4.7 3.8*  HGB 8.2* 8.0*  HCT 25.6* 25.1*  PLT 395 425*    BMET  Recent Labs  03/20/15 0509 03/21/15 0540  NA 138 138  K 3.6 3.4*  CL 106 105  CO2 23 23  GLUCOSE 114* 95  BUN <5* <5*  CREATININE 0.46* 0.54  CALCIUM 8.4 7.9*   PT/INR No results for input(s): LABPROT, INR in the last 72 hours.   Recent Labs Lab 03/15/15 0940 03/16/15 0745 03/19/15 0612  AST 41* 37 24  ALT 28 24 14   ALKPHOS 71 68 63  BILITOT 0.5 0.2* 0.3  PROT 5.8* 5.1* 5.3*  ALBUMIN 1.9* 1.9* 1.6*     Lipase  No results found for: LIPASE   Studies/Results: Ct Angio Chest Pe W/cm &/or Wo Cm  03/20/2015   CLINICAL DATA:  Shortness of breath. Tachycardia. Elevated D-dimer.  EXAM: CT ANGIOGRAPHY CHEST WITH CONTRAST  TECHNIQUE: Multidetector CT imaging of the chest was performed using the standard protocol during bolus administration of intravenous contrast. Multiplanar CT image reconstructions and MIPs were obtained to evaluate the vascular anatomy.  CONTRAST:  10mL OMNIPAQUE IOHEXOL 350 MG/ML SOLN  COMPARISON:  02/21/2015  FINDINGS: Technically adequate  study with good opacification of the central and segmental pulmonary arteries. No focal filling defects. No evidence of significant pulmonary embolus.  Normal heart size. Normal caliber thoracic aorta without evidence of dissection. Great vessel origins are patent. Esophagus is decompressed. No significant lymphadenopathy in the mediastinum. Again demonstrated is lymphadenopathy in the right axilla with enlarged lymph nodes measuring up to about 15 mm short axis dimension. Large left thyroid gland nodule measuring up to 3.5 cm diameter. No change since prior study.  Moderate size bilateral pleural effusions with atelectasis or consolidation in both lung bases. This is similar to prior study. No pneumothorax.  Included portion of upper abdominal organs is unremarkable except for a small accessory spleen. Degenerative changes in the spine. No destructive bone lesions.  Review of the MIP images confirms the above findings.  IMPRESSION: No evidence of significant pulmonary embolus. Again demonstrated are moderate bilateral pleural effusions with atelectasis or consolidation in the lung bases. Axillary lymphadenopathy. Appearance is nonspecific and follow-up suggested. Left thyroid gland nodule. Follow-up ultrasound has been suggested.   Electronically Signed   By: Lucienne Capers M.D.   On: 03/20/2015 04:28   Dg Chest Port 1 View  03/20/2015   CLINICAL DATA:  Walking Corpse syndrome  EXAM: PORTABLE CHEST - 1 VIEW  COMPARISON:  03/17/2015  FINDINGS: The cardiac enlargement with pulmonary vascular congestion and perihilar edema, increasing since prior study. Small to  moderate left pleural effusion is also increasing. No pneumothorax.  IMPRESSION: Increasing pulmonary vascular congestion, edema, and left pleural effusion since prior study.   Electronically Signed   By: Lucienne Capers M.D.   On: 03/20/2015 00:11   Dg Knee Ap/lat W/sunrise Left  03/19/2015   CLINICAL DATA:  Pain and swelling.  No appreciable trauma   EXAM: LEFT KNEE 3 VIEWS  COMPARISON:  None.  FINDINGS: Frontal, lateral, and sunrise patellar images were obtained. There is no fracture or dislocation. Joint spaces appear intact. No effusion. No erosive change.  IMPRESSION: No fracture or effusion.  No appreciable arthropathy.   Electronically Signed   By: Lowella Grip III M.D.   On: 03/19/2015 13:55   Dg Knee Ap/lat W/sunrise Right  03/19/2015   CLINICAL DATA:  Pain and swelling.  No history of trauma  EXAM: RIGHT KNEE 3 VIEWS  COMPARISON:  March 05, 2015  FINDINGS: Frontal, lateral, and sunrise patellar images were obtained. No fracture or dislocation. No appreciable joint effusion. Joint spaces appear intact. No erosive change.  IMPRESSION: No fracture or effusion.  No appreciable arthropathy.   Electronically Signed   By: Lowella Grip III M.D.   On: 03/19/2015 13:57    Medications: .  ceFAZolin (ANCEF) IV  2 g Intravenous 3 times per day    Assessment/Plan Right axillary adenopathy. RIGHT AXILLARY LYMPH NODE BIOPSY. 03/20/15, Dr. Alphonsa Overall SCD added for DVT prophylaxis Goiter  Bilateral pleural effusion  Arthralgia  Amenorrhea  LAD (lymphadenopathy), axillary  Bilateral edema of lower extremity  Fever  Sepsis  Transaminasemia  Latent tuberculosis by blood test  Bacteremia  Knee effusion  Pleural effusion  Tuberculosis   Plan:  Clean site with soap and water.  Smaller dressing just to keep dry.  Will recheck on Monday.  Call if there is an issue.    LOS: 7 days    Nichole Perry 03/21/2015

## 2015-03-21 NOTE — Progress Notes (Signed)
INFECTIOUS DISEASE PROGRESS NOTE  ID: Nichole Perry is a 44 y.o. female with  Principal Problem:   Coagulase negative Staphylococcus bacteremia Active Problems:   Goiter   Bilateral pleural effusion   Arthralgia   Amenorrhea   LAD (lymphadenopathy), axillary   Bilateral edema of lower extremity   Fever   Sepsis   Transaminasemia   Latent tuberculosis by blood test   Bacteremia   Knee effusion   Pleural effusion   Tuberculosis  Subjective: Feels better  Abtx:  Anti-infectives    Start     Dose/Rate Route Frequency Ordered Stop   03/14/15 1445  ceFAZolin (ANCEF) IVPB 2 g/50 mL premix     2 g 100 mL/hr over 30 Minutes Intravenous 3 times per day 03/14/15 1426     03/14/15 1400  piperacillin-tazobactam (ZOSYN) IVPB 3.375 g  Status:  Discontinued     3.375 g 12.5 mL/hr over 240 Minutes Intravenous 3 times per day 03/14/15 1319 03/14/15 1426   03/14/15 0830  piperacillin-tazobactam (ZOSYN) IVPB 3.375 g  Status:  Discontinued     3.375 g 12.5 mL/hr over 240 Minutes Intravenous Every 8 hours 03/14/15 0217 03/14/15 0651   03/14/15 0230  vancomycin (VANCOCIN) IVPB 750 mg/150 ml premix  Status:  Discontinued     750 mg 150 mL/hr over 60 Minutes Intravenous Every 12 hours 03/14/15 0217 03/16/15 1151   03/14/15 0215  piperacillin-tazobactam (ZOSYN) IVPB 3.375 g     3.375 g 100 mL/hr over 30 Minutes Intravenous  Once 03/14/15 0213 03/14/15 0323      Medications:  Scheduled: .  ceFAZolin (ANCEF) IV  2 g Intravenous 3 times per day    Objective: Vital signs in last 24 hours: Temp:  [97.4 F (36.3 C)-101 F (38.3 C)] 98.9 F (37.2 C) (04/09 0750) Pulse Rate:  [87-120] 92 (04/09 0601) Resp:  [16-35] 17 (04/09 0601) BP: (100-123)/(66-73) 104/66 mmHg (04/09 0601) SpO2:  [100 %] 100 % (04/09 0601) Weight:  [57.199 kg (126 lb 1.6 oz)] 57.199 kg (126 lb 1.6 oz) (04/09 0500)   General appearance: alert, cooperative and no distress Resp: rhonchi anterior - right Cardio:  regular rate and rhythm GI: normal findings: bowel sounds normal and soft, non-tender  Lab Results  Recent Labs  03/20/15 0509 03/21/15 0540  WBC 4.7 3.8*  HGB 8.2* 8.0*  HCT 25.6* 25.1*  NA 138 138  K 3.6 3.4*  CL 106 105  CO2 23 23  BUN <5* <5*  CREATININE 0.46* 0.54   Liver Panel  Recent Labs  03/19/15 0612  PROT 5.3*  ALBUMIN 1.6*  AST 24  ALT 14  ALKPHOS 63  BILITOT 0.3   Sedimentation Rate No results for input(s): ESRSEDRATE in the last 72 hours. C-Reactive Protein No results for input(s): CRP in the last 72 hours.  Microbiology: Recent Results (from the past 240 hour(s))  Culture, blood (single)     Status: None   Collection Time: 03/12/15  1:25 PM  Result Value Ref Range Status   Culture STAPHYLOCOCCUS SPECIES (COAGULASE NEGATIVE)  Final   Organism ID, Bacteria STAPHYLOCOCCUS SPECIES (COAGULASE NEGATIVE)  Final    Comment: Rifampin and Gentamicin should not be used as single drugs for treatment of Staph infections. This organism DOES NOT demonstrate inducible Clindamycin resistance in vitro. Gram Stain Report Called to,Read Back By and Verified With: DR. Reginia Forts 03/13/15 AT 2300 RIDK       Susceptibility   Staphylococcus species (coagulase negative) -  (no method  available)    PENICILLIN 0.25 Resistant     OXACILLIN <=0.25 Sensitive     CEFAZOLIN  Sensitive     GENTAMICIN <=0.5 Sensitive     CIPROFLOXACIN <=0.5 Sensitive     LEVOFLOXACIN <=0.12 Sensitive     TRIMETH/SULFA <=10 Sensitive     VANCOMYCIN 1 Sensitive     CLINDAMYCIN <=0.25 Sensitive     ERYTHROMYCIN >=8 Resistant     RIFAMPIN <=0.5 Sensitive     TETRACYCLINE <=1 Sensitive   Culture, blood (single)     Status: None   Collection Time: 03/12/15  1:25 PM  Result Value Ref Range Status   Organism ID, Bacteria STAPHYLOCOCCUS SPECIES (COAGULASE NEGATIVE)  Final    Comment: Susceptibilities performed on previous culture within the last 5 days. Gram Stain Report Called to,Read  Back By and Verified With: DR. Reginia Forts 03/13/15 AT 2230 [place]   Blood Culture (routine x 2)     Status: None   Collection Time: 03/14/15  2:39 AM  Result Value Ref Range Status   Specimen Description BLOOD RIGHT WRIST  Final   Special Requests BOTTLES DRAWN AEROBIC ONLY 4CC  Final   Culture   Final    NO GROWTH 5 DAYS Performed at [facility]    Report Status 03/20/2015 FINAL  Final  Blood Culture (routine x 2)     Status: None   Collection Time: 03/14/15  2:46 AM  Result Value Ref Range Status   Specimen Description BLOOD LEFT ARM  Final   Special Requests BOTTLES DRAWN AEROBIC AND ANAEROBIC 5CC  Final   Culture   Final    NO GROWTH 5 DAYS Performed at [facility]    Report Status 03/20/2015 FINAL  Final  Urine culture     Status: None   Collection Time: 03/14/15  8:43 AM  Result Value Ref Range Status   Specimen Description URINE, RANDOM  Final   Special Requests NONE  Final   Colony Count   Final    1,000 COLONIES/ML Performed at [facility]    Culture   Final    INSIGNIFICANT GROWTH Performed at [facility]    Report Status 03/16/2015 FINAL  Final  Culture, blood (routine x 2)     Status: None   Collection Time: 03/15/15  9:40 AM  Result Value Ref Range Status   Specimen Description BLOOD LEFT ARM  Final   Special Requests BOTTLES DRAWN AEROBIC ONLY 10 CC  Final   Culture   Final    NO GROWTH 5 DAYS Note: Culture results may be compromised due to an excessive volume of blood received in culture bottles. Performed at [facility]    Report Status 03/21/2015 FINAL  Final  Culture, blood (routine x 2)     Status: None   Collection Time: 03/15/15  9:45 AM  Result Value Ref Range Status   Specimen Description BLOOD LEFT WRIST  Final   Special Requests BOTTLES DRAWN AEROBIC ONLY 10 CC  Final   Culture   Final    NO GROWTH 5 DAYS Note: Culture results may be compromised due to an excessive volume of blood received in  culture bottles. Performed at [facility]    Report Status 03/21/2015 FINAL  Final  Body fluid culture     Status: None   Collection Time: 03/17/15  2:27 PM  Result Value Ref Range Status   Specimen Description FLUID RIGHT PLEURAL  Final   Special  Requests NONE  Final   Gram Stain   Final    RARE WBC PRESENT,BOTH PMN AND MONONUCLEAR NO ORGANISMS SEEN Performed at [facility]    Culture   Final    NO GROWTH 3 DAYS Performed at [facility]    Report Status 03/21/2015 FINAL  Final  Fungus Culture with Smear     Status: None (Preliminary result)   Collection Time: 03/19/15 12:18 PM  Result Value Ref Range Status   Specimen Description FLUID SYNOVIAL RIGHT KNEE  Final   Special Requests Normal  Final   Fungal Smear   Final    NO YEAST OR FUNGAL ELEMENTS SEEN Performed at [facility]    Culture   Final    CULTURE IN PROGRESS FOR FOUR WEEKS Performed at [facility]    Report Status PENDING  Incomplete  Body fluid culture     Status: None (Preliminary result)   Collection Time: 03/19/15 12:19 PM  Result Value Ref Range Status   Specimen Description FLUID SYNOVIAL RIGHT KNEE  Final   Special Requests Normal  Final   Gram Stain   Final    MODERATE WBC PRESENT, PREDOMINANTLY PMN NO ORGANISMS SEEN Performed at [facility]    Culture   Final    NO GROWTH 2 DAYS Performed at [facility]    Report Status PENDING  Incomplete  AFB culture with smear     Status: None (Preliminary result)   Collection Time: 03/19/15 12:19 PM  Result Value Ref Range Status   Specimen Description FLUID SYNOVIAL RIGHT KNEE  Final   Special Requests Normal  Final   Acid Fast Smear   Final    NO ACID FAST BACILLI SEEN Performed at [facility]    Culture   Final    CULTURE WILL BE EXAMINED FOR 6 WEEKS BEFORE ISSUING A FINAL REPORT Performed at [facility]    Report Status PENDING  Incomplete  Culture, blood (routine  x 2)     Status: None (Preliminary result)   Collection Time: 03/20/15  5:00 AM  Result Value Ref Range Status   Specimen Description BLOOD LEFT ARM  Final   Special Requests BOTTLES DRAWN AEROBIC AND ANAEROBIC 5ML  Final   Culture   Final           BLOOD CULTURE RECEIVED NO GROWTH TO DATE CULTURE WILL BE HELD FOR 5 DAYS BEFORE ISSUING A FINAL NEGATIVE REPORT Performed at [facility]    Report Status PENDING  Incomplete  Culture, blood (routine x 2)     Status: None (Preliminary result)   Collection Time: 03/20/15  5:10 AM  Result Value Ref Range Status   Specimen Description BLOOD RIGHT ARM  Final   Special Requests BOTTLES DRAWN AEROBIC AND ANAEROBIC 5ML  Final   Culture   Final           BLOOD CULTURE RECEIVED NO GROWTH TO DATE CULTURE WILL BE HELD FOR 5 DAYS BEFORE ISSUING A FINAL NEGATIVE REPORT Performed at [facility]    Report Status PENDING  Incomplete  Tissue culture     Status: None (Preliminary result)   Collection Time: 03/20/15  5:42 PM  Result Value Ref Range Status   Specimen Description TISSUE LYMPH NODE  Final   Special Requests NONE  Final   Gram Stain   Final    FEW WBC PRESENT, PREDOMINANTLY MONONUCLEAR NO ORGANISMS SEEN Performed at [facility]    Culture PENDING  Incomplete   Report Status PENDING  Incomplete    Studies/Results: Ct Angio Chest Pe W/cm &/or Wo Cm  03/20/2015   CLINICAL DATA:  Shortness of breath. Tachycardia. Elevated D-dimer.  EXAM: CT ANGIOGRAPHY CHEST WITH CONTRAST  TECHNIQUE: Multidetector CT imaging of the chest was performed using the standard protocol during bolus administration of intravenous contrast. Multiplanar CT image reconstructions and MIPs were obtained to evaluate the vascular anatomy.  CONTRAST:  55mL OMNIPAQUE IOHEXOL 350 MG/ML SOLN  COMPARISON:  02/21/2015  FINDINGS: Technically adequate study with good opacification of the central and segmental pulmonary arteries. No focal filling defects. No  evidence of significant pulmonary embolus.  Normal heart size. Normal caliber thoracic aorta without evidence of dissection. Great vessel origins are patent. Esophagus is decompressed. No significant lymphadenopathy in the mediastinum. Again demonstrated is lymphadenopathy in the right axilla with enlarged lymph nodes measuring up to about 15 mm short axis dimension. Large left thyroid gland nodule measuring up to 3.5 cm diameter. No change since prior study.  Moderate size bilateral pleural effusions with atelectasis or consolidation in both lung bases. This is similar to prior study. No pneumothorax.  Included portion of upper abdominal organs is unremarkable except for a small accessory spleen. Degenerative changes in the spine. No destructive bone lesions.  Review of the MIP images confirms the above findings.  IMPRESSION: No evidence of significant pulmonary embolus. Again demonstrated are moderate bilateral pleural effusions with atelectasis or consolidation in the lung bases. Axillary lymphadenopathy. Appearance is nonspecific and follow-up suggested. Left thyroid gland nodule. Follow-up ultrasound has been suggested.   Electronically Signed   By: Lucienne Capers M.D.   On: 03/20/2015 04:28   Dg Chest Port 1 View  03/20/2015   CLINICAL DATA:  Walking Corpse syndrome  EXAM: PORTABLE CHEST - 1 VIEW  COMPARISON:  03/17/2015  FINDINGS: The cardiac enlargement with pulmonary vascular congestion and perihilar edema, increasing since prior study. Small to moderate left pleural effusion is also increasing. No pneumothorax.  IMPRESSION: Increasing pulmonary vascular congestion, edema, and left pleural effusion since prior study.   Electronically Signed   By: Lucienne Capers M.D.   On: 03/20/2015 00:11   Dg Knee Ap/lat W/sunrise Left  03/19/2015   CLINICAL DATA:  Pain and swelling.  No appreciable trauma  EXAM: LEFT KNEE 3 VIEWS  COMPARISON:  None.  FINDINGS: Frontal, lateral, and sunrise patellar images were  obtained. There is no fracture or dislocation. Joint spaces appear intact. No effusion. No erosive change.  IMPRESSION: No fracture or effusion.  No appreciable arthropathy.   Electronically Signed   By: Lowella Grip III M.D.   On: 03/19/2015 13:55   Dg Knee Ap/lat W/sunrise Right  03/19/2015   CLINICAL DATA:  Pain and swelling.  No history of trauma  EXAM: RIGHT KNEE 3 VIEWS  COMPARISON:  March 05, 2015  FINDINGS: Frontal, lateral, and sunrise patellar images were obtained. No fracture or dislocation. No appreciable joint effusion. Joint spaces appear intact. No erosive change.  IMPRESSION: No fracture or effusion.  No appreciable arthropathy.   Electronically Signed   By: Lowella Grip III M.D.   On: 03/19/2015 13:57     Assessment/Plan: MSSE Bacteremia LAN, quantiferon+ Protein albumin malnutrition, severe   Total days of antibiotics: 7 ancef  Will start her on TB rx (R/E/I/P/B6) Previous LFTs normal Await results of LN bx   Nutrition eval?  Available if questions on 4-10  Bobby Rumpf Infectious Diseases (pager) [phone] [email or web address] 03/21/2015, 11:47 AM  LOS: 7 days

## 2015-03-21 NOTE — Progress Notes (Signed)
INITIAL NUTRITION ASSESSMENT  DOCUMENTATION CODES Per approved criteria  -Not Applicable   INTERVENTION:  Resource Breeze PO TID, each supplement provides 250 kcal and 9 grams of protein.  Multivitamin daily.  Family to bring in food from home.  NUTRITION DIAGNOSIS: Inadequate oral intake related to poor appetite as evidenced by poor intake of meals.   Goal: Intake to meet >90% of estimated nutrition needs.  Monitor:  PO intake, labs, weight trend.  Reason for Assessment: MD consult for assessment of nutrition status  44 y.o. female  Admitting Dx: Coagulase negative Staphylococcus bacteremia  ASSESSMENT: Patient admitted on 4/2 with positive blood cultures. TB workup is ongoing, currently on respiratory isolation.  Patient and her family report that she has been eating poorly since admission. Ate a little better at breakfast today. She typically eats small amounts at home. She agreed to try a PO supplement to maximize oral intake of protein and calories. Family may start bringing in food from home for patient.  Weight has increased since admission, likely related to positive fluid status. Nutrition focused physical exam completed.  No muscle or subcutaneous fat depletion noticed.  Height: Ht Readings from Last 1 Encounters:  03/14/15 5\' 2"  (1.575 m)    Weight: Wt Readings from Last 1 Encounters:  03/21/15 126 lb 1.6 oz (57.199 kg)    Ideal Body Weight: 50 kg  % Ideal Body Weight: 114%  Wt Readings from Last 10 Encounters:  03/21/15 126 lb 1.6 oz (57.199 kg)  03/12/15 115 lb 12.8 oz (52.527 kg)  03/05/15 118 lb (53.524 kg)  02/21/15 124 lb (56.246 kg)  06/24/13 116 lb (52.617 kg)  05/02/13 120 lb (54.432 kg)  03/05/13 124 lb 9.6 oz (56.518 kg)  04/30/12 122 lb 6.4 oz (55.52 kg)  02/07/12 120 lb (54.432 kg)    Usual Body Weight: 118 lbs per patient  % Usual Body Weight: 107%  BMI:  Body mass index is 23.06 kg/(m^2).  Estimated Nutritional  Needs: Kcal: 1500-1700 Protein: 70-85 gm Fluid: 1.5-1.7 L  Skin: no issues  Diet Order: Diet regular Room service appropriate?: Yes; Fluid consistency:: Thin  EDUCATION NEEDS: -Education needs addressed   Intake/Output Summary (Last 24 hours) at 03/21/15 1258 Last data filed at 03/21/15 1100  Gross per 24 hour  Intake 3566.67 ml  Output      4 ml  Net 3562.67 ml    Last BM: 4/2   Labs:   Recent Labs Lab 03/19/15 0612 03/20/15 0509 03/21/15 0540  NA 136 138 138  K 3.7 3.6 3.4*  CL 106 106 105  CO2 21 23 23   BUN <5* <5* <5*  CREATININE 0.50 0.46* 0.54  CALCIUM 7.9* 8.4 7.9*  GLUCOSE 124* 114* 95    CBG (last 3)  No results for input(s): GLUCAP in the last 72 hours.  Scheduled Meds: .  ceFAZolin (ANCEF) IV  2 g Intravenous 3 times per day  . ethambutol  15 mg/kg Oral Daily  . isoniazid  300 mg Oral Daily  . pyrazinamide  1,000 mg Oral Daily  . vitamin B-6  50 mg Oral Daily  . rifampin  600 mg Oral Daily    Continuous Infusions: . lactated ringers 10 mL/hr at 03/20/15 1629    Past Medical History  Diagnosis Date  . Anemia   . Goiter 02/07/2012  . Tuberculosis     Past Surgical History  Procedure Laterality Date  . Back surgery      Molli Barrows, RD, LDN, CNSC  Pager [phone] After Hours Pager [phone]

## 2015-03-21 NOTE — Progress Notes (Signed)
[facility]                                                                              Patient Demographics  Nichole Perry, is a 44 y.o. female, DOB - 04/01/1971, MPN:[id number]  Admit date - 03/14/2015   Admitting Physician Lavina Hamman, MD  Outpatient Primary MD for the patient is Maximino Greenland, MD  LOS - 7   Chief Complaint  Patient presents with  . Abnormal Lab      Brief history 44 year old female with a history of goiter and anemia presented to the hospital when she was called by her primary care provider with positive blood cultures. The patient has not been feeling well for nearly 3 weeks. The patient initially presented to her primary care provider because of bilateral knee pain and leg swelling on 02/21/2015. She was started on furosemide 20 mg daily. Chest x-ray at that time revealed small to moderate bilateral pleural effusions. CT angio of the chest was performed on 02/21/2015 which was negative for pulmonary embolus but revealed bilateral pleural effusions and central bronchial wall thickening and enlarged right axillary lymph node. She underwent thoracocentesis on 03/03/2015. Fluid chemistries revealed an exudative pleural effusion with WBC 3000 (64% monos, 28% PMN, 8% eosinophils). Routine Fluid culture and AFB culture are negative at this time. 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 The patient followed up on 03/07/2015, and an autoimmune workup was begun which was negative. Again, the patient represented to the primary care provider office on 03/12/2015 with 2 day history of fever up to 101.25F. Blood cultures were obtained at that time are growing CoNS. Notably, the patient also was noted to have a right thyroid nodule which has been biopsied on 03/10/2015. The biopsy revealed benign follicular nodule. Since admission, the patient has continued to have fevers. Workup continues in progress.  Assessment & Plan   Sepsis -Improving. Fever 101F max 4/9 -Secondary to  bacteremia ? Vs TB -Lactic acid 2.22 on 03/14/2015 -Continue IV cefazolin -flu is neg  Dyspnea  -Resolved, patient became tachypneic with tachycardia overnight on 4/7-8 -D-dimer was noted to be elevated, CT chest was conducted showing no pulmonary embolism however moderate bilateral pleural effusions. -Patient was given a dose of Lasix, dyspnea has improved -Currently remains on 2 L oxygen via nasal cannula -IVF discontinued  Bacteremia-- Coag negative Staph  -unclear presently if true bacteremia vs contaminant as 4/2 repeat blood cultures remain negative to date (had not yet started antibiotics) -may be partly contributing to clinical picture, but doubt is the entire answer -ID following -Echocardiogram: EF 60%, no gross vegetation -follow surveillance blood cultures--neg to date -TEE cannot be done at this time as patient is on airborne precautions -Continue cefazolin day 7 (for 14 days total)  QuantiFeron Positive/Exudative pleural effusion -03/17/15 repeat thoracocentesis WBC 797 (75% lymphs), glucose 150-- MTB PCR, ADA, glucose, cytology/ culture show no growth -given pt's clinical and epidemiologic hx, will still need to r/o pulmonary TB in setting of lymphocytic effusion -airborne isolation--await sputum although pt is barely coughing and unable to induce sputum -Spoke with Pulm regarding possible VATS (pleural bx)- however no longer available; Spoke with [facility], no longer done there either, may need  CT surg -General surgery consulted and appreciated, s/p Right axillary lymph node biopsy- cytology pending -Spoke with ID (quantiferon + in March 2016)- TB treatment started today- Rifampin, Isoniazid, Pyridoxine, ethambutol with Vitamin B6  Transaminasemia -Resolved, secondary to sepsis -No abdominal pain, no vomiting -Hepatitis B surface antigen, hepatitis C antibody--negative -HIV antibody is negative  Bilateral pleural effusion -03/03/2015  thoracocentesis--exudative -WBC 3000 (64% monos, 28% PMN, 8% eosinophils)  Arthralgias and myalgias/ Lower extremity edema and pain -Autoimmune workup has been negative including C3, C4, anti-CCP, ANA, RF, ENA, anti-dsDNA, Anti-SmAb -Lower ext doppler negative for DVT -improving with standard antibiotics -Continues to have swelling in upper ext and knees- improved -Consulted orthopedics- conducted aspiration of right knee at bedside -Right knee fluid culture: no organisms seen; no yeast or fungal elements  Normocytic Anemia -Likely chronic. Hb upon admission 10, remaining stable at 8 -Drop in hb likely dilutional as patient has been on IVF -Anemia panel pending  Code Status: Full  Family Communication: Husband at bedside  Disposition Plan: Admitted.  Will continue to monitor as patient continues to spike fevers.  Started on TB treatment.  On 03/19/2015, Spoke with [facility] and [facility]- had no recommendations and did not feel patient needed to transfer.   Time Spent in minutes   30 minutes  Procedures  Echocardiogram US Thoracentesis Right knee aspiration Right axillary lymph node biopsy  Consults   Pulmonology Interventional radiology Infectious disease Orthopedic surgery General surgery  DVT Prophylaxis  SCDs  Lab Results  Component Value Date   PLT 425* 03/21/2015    Medications  Scheduled Meds: .  ceFAZolin (ANCEF) IV  2 g Intravenous 3 times per day  . ethambutol  15 mg/kg Oral Daily  . isoniazid  300 mg Oral Daily  . pyrazinamide  1,000 mg Oral Daily  . vitamin B-6  50 mg Oral Daily  . rifampin  600 mg Oral Daily   Continuous Infusions: . sodium chloride 75 mL/hr at 03/20/15 1937  . lactated ringers 10 mL/hr at 03/20/15 1629   PRN Meds:.acetaminophen **OR** acetaminophen, HYDROmorphone (DILAUDID) injection, promethazine  Antibiotics    Anti-infectives    Start     Dose/Rate Route Frequency Ordered Stop   03/21/15 1300  pyrazinamide tablet 1,000 mg      1,000 mg Oral Daily 03/21/15 1200     03/21/15 1230  ethambutol (MYAMBUTOL) tablet 850 mg     15 mg/kg  57.2 kg Oral Daily 03/21/15 1200     03/21/15 1230  rifampin (RIFADIN) capsule 600 mg     600 mg Oral Daily 03/21/15 1200     03/21/15 1230  isoniazid (NYDRAZID) tablet 300 mg     300 mg Oral Daily 03/21/15 1200     03/14/15 1445  ceFAZolin (ANCEF) IVPB 2 g/50 mL premix     2 g 100 mL/hr over 30 Minutes Intravenous 3 times per day 03/14/15 1426     03/14/15 1400  piperacillin-tazobactam (ZOSYN) IVPB 3.375 g  Status:  Discontinued     3.375 g 12.5 mL/hr over 240 Minutes Intravenous 3 times per day 03/14/15 1319 03/14/15 1426   03/14/15 0830  piperacillin-tazobactam (ZOSYN) IVPB 3.375 g  Status:  Discontinued     3.375 g 12.5 mL/hr over 240 Minutes Intravenous Every 8 hours 03/14/15 0217 03/14/15 0651   03/14/15 0230  vancomycin (VANCOCIN) IVPB 750 mg/150 ml premix  Status:  Discontinued     750 mg 150 mL/hr over 60 Minutes Intravenous Every 12 hours 03/14/15 0217  03/16/15 1151   03/14/15 0215  piperacillin-tazobactam (ZOSYN) IVPB 3.375 g     3.375 g 100 mL/hr over 30 Minutes Intravenous  Once 03/14/15 0213 03/14/15 0323        Subjective:   [name] seen and examined today.  Patient states she is feeling better today.  Feels some pain in her knees, but improved from prior days. Denies shortness of breath or any episodes over night.  Per husband, appetite has been poor, however, patient states she feels hungry.  Denies current shortness of breath or chest pain.   Objective:   Filed Vitals:   03/21/15 0017 03/21/15 0500 03/21/15 0601 03/21/15 0750  BP: 100/66  104/66   Pulse: 87  92   Temp: 99.6 F (37.6 C)  100.3 F (37.9 C) 98.9 F (37.2 C)  TempSrc:    Oral  Resp: 17  17   Height:      Weight:  57.199 kg (126 lb 1.6 oz)    SpO2: 100%  100%     Wt Readings from Last 3 Encounters:  03/21/15 57.199 kg (126 lb 1.6 oz)  03/12/15 52.527 kg (115 lb 12.8 oz)   03/05/15 53.524 kg (118 lb)     Intake/Output Summary (Last 24 hours) at 03/21/15 1252 Last data filed at 03/21/15 1100  Gross per 24 hour  Intake 3566.67 ml  Output      4 ml  Net 3562.67 ml    Exam  General: Well developed, NAD  Cardiovascular: S1 S2 auscultated,RRR, no murmus  Respiratory: Diminished breath sounds, no wheezing or rhonchi  Abdomen: Soft, nontender, nondistended, + bowel sounds  Extremities: trace B/L ankle and hand edema- improved  Neuro: AAOx3, nonfocal  Psych: Pleasant, appropriate mood and affect  Data Review   Micro Results Recent Results (from the past 240 hour(s))  Culture, blood (single)     Status: None   Collection Time: 03/12/15  1:25 PM  Result Value Ref Range Status   Culture STAPHYLOCOCCUS SPECIES (COAGULASE NEGATIVE)  Final   Organism ID, Bacteria STAPHYLOCOCCUS SPECIES (COAGULASE NEGATIVE)  Final    Comment: Rifampin and Gentamicin should not be used as single drugs for treatment of Staph infections. This organism DOES NOT demonstrate inducible Clindamycin resistance in vitro. Gram Stain Report Called to,Read Back By and Verified With: DR. Reginia Forts 03/13/15 AT 2300 RIDK       Susceptibility   Staphylococcus species (coagulase negative) -  (no method available)    PENICILLIN 0.25 Resistant     OXACILLIN <=0.25 Sensitive     CEFAZOLIN  Sensitive     GENTAMICIN <=0.5 Sensitive     CIPROFLOXACIN <=0.5 Sensitive     LEVOFLOXACIN <=0.12 Sensitive     TRIMETH/SULFA <=10 Sensitive     VANCOMYCIN 1 Sensitive     CLINDAMYCIN <=0.25 Sensitive     ERYTHROMYCIN >=8 Resistant     RIFAMPIN <=0.5 Sensitive     TETRACYCLINE <=1 Sensitive   Culture, blood (single)     Status: None   Collection Time: 03/12/15  1:25 PM  Result Value Ref Range Status   Organism ID, Bacteria STAPHYLOCOCCUS SPECIES (COAGULASE NEGATIVE)  Final    Comment: Susceptibilities performed on previous culture within the last 5 days. Gram Stain Report Called  to,Read Back By and Verified With: DR. Reginia Forts 03/13/15 AT 2230 [place]   Blood Culture (routine x 2)     Status: None   Collection Time: 03/14/15  2:39 AM  Result Value Ref Range  Status   Specimen Description BLOOD RIGHT WRIST  Final   Special Requests BOTTLES DRAWN AEROBIC ONLY 4CC  Final   Culture   Final    NO GROWTH 5 DAYS Performed at [facility]    Report Status 03/20/2015 FINAL  Final  Blood Culture (routine x 2)     Status: None   Collection Time: 03/14/15  2:46 AM  Result Value Ref Range Status   Specimen Description BLOOD LEFT ARM  Final   Special Requests BOTTLES DRAWN AEROBIC AND ANAEROBIC 5CC  Final   Culture   Final    NO GROWTH 5 DAYS Performed at [facility]    Report Status 03/20/2015 FINAL  Final  Urine culture     Status: None   Collection Time: 03/14/15  8:43 AM  Result Value Ref Range Status   Specimen Description URINE, RANDOM  Final   Special Requests NONE  Final   Colony Count   Final    1,000 COLONIES/ML Performed at [facility]    Culture   Final    INSIGNIFICANT GROWTH Performed at [facility]    Report Status 03/16/2015 FINAL  Final  Culture, blood (routine x 2)     Status: None   Collection Time: 03/15/15  9:40 AM  Result Value Ref Range Status   Specimen Description BLOOD LEFT ARM  Final   Special Requests BOTTLES DRAWN AEROBIC ONLY 10 CC  Final   Culture   Final    NO GROWTH 5 DAYS Note: Culture results may be compromised due to an excessive volume of blood received in culture bottles. Performed at [facility]    Report Status 03/21/2015 FINAL  Final  Culture, blood (routine x 2)     Status: None   Collection Time: 03/15/15  9:45 AM  Result Value Ref Range Status   Specimen Description BLOOD LEFT WRIST  Final   Special Requests BOTTLES DRAWN AEROBIC ONLY 10 CC  Final   Culture   Final    NO GROWTH 5 DAYS Note: Culture results may be compromised due to an excessive volume of blood  received in culture bottles. Performed at [facility]    Report Status 03/21/2015 FINAL  Final  Body fluid culture     Status: None   Collection Time: 03/17/15  2:27 PM  Result Value Ref Range Status   Specimen Description FLUID RIGHT PLEURAL  Final   Special Requests NONE  Final   Gram Stain   Final    RARE WBC PRESENT,BOTH PMN AND MONONUCLEAR NO ORGANISMS SEEN Performed at [facility]    Culture   Final    NO GROWTH 3 DAYS Performed at [facility]    Report Status 03/21/2015 FINAL  Final  Fungus Culture with Smear     Status: None (Preliminary result)   Collection Time: 03/19/15 12:18 PM  Result Value Ref Range Status   Specimen Description FLUID SYNOVIAL RIGHT KNEE  Final   Special Requests Normal  Final   Fungal Smear   Final    NO YEAST OR FUNGAL ELEMENTS SEEN Performed at [facility]    Culture   Final    CULTURE IN PROGRESS FOR FOUR WEEKS Performed at [facility]    Report Status PENDING  Incomplete  Body fluid culture     Status: None (Preliminary result)   Collection Time: 03/19/15 12:19 PM  Result Value Ref Range Status   Specimen Description  FLUID SYNOVIAL RIGHT KNEE  Final   Special Requests Normal  Final   Gram Stain   Final    MODERATE WBC PRESENT, PREDOMINANTLY PMN NO ORGANISMS SEEN Performed at [facility]    Culture   Final    NO GROWTH 2 DAYS Performed at [facility]    Report Status PENDING  Incomplete  AFB culture with smear     Status: None (Preliminary result)   Collection Time: 03/19/15 12:19 PM  Result Value Ref Range Status   Specimen Description FLUID SYNOVIAL RIGHT KNEE  Final   Special Requests Normal  Final   Acid Fast Smear   Final    NO ACID FAST BACILLI SEEN Performed at [facility]    Culture   Final    CULTURE WILL BE EXAMINED FOR 6 WEEKS BEFORE ISSUING A FINAL REPORT Performed at [facility]    Report Status PENDING  Incomplete  Culture,  blood (routine x 2)     Status: None (Preliminary result)   Collection Time: 03/20/15  5:00 AM  Result Value Ref Range Status   Specimen Description BLOOD LEFT ARM  Final   Special Requests BOTTLES DRAWN AEROBIC AND ANAEROBIC 5ML  Final   Culture   Final           BLOOD CULTURE RECEIVED NO GROWTH TO DATE CULTURE WILL BE HELD FOR 5 DAYS BEFORE ISSUING A FINAL NEGATIVE REPORT Performed at [facility]    Report Status PENDING  Incomplete  Culture, blood (routine x 2)     Status: None (Preliminary result)   Collection Time: 03/20/15  5:10 AM  Result Value Ref Range Status   Specimen Description BLOOD RIGHT ARM  Final   Special Requests BOTTLES DRAWN AEROBIC AND ANAEROBIC 5ML  Final   Culture   Final           BLOOD CULTURE RECEIVED NO GROWTH TO DATE CULTURE WILL BE HELD FOR 5 DAYS BEFORE ISSUING A FINAL NEGATIVE REPORT Performed at [facility]    Report Status PENDING  Incomplete  Tissue culture     Status: None (Preliminary result)   Collection Time: 03/20/15  5:42 PM  Result Value Ref Range Status   Specimen Description TISSUE LYMPH NODE  Final   Special Requests NONE  Final   Gram Stain   Final    FEW WBC PRESENT, PREDOMINANTLY MONONUCLEAR NO ORGANISMS SEEN Performed at [facility]    Culture PENDING  Incomplete   Report Status PENDING  Incomplete    Radiology Reports Dg Chest 1 View  03/17/2015   CLINICAL DATA:  Right sided thoracentesis, status post removal of 580 cc of yellow fluid. Cough.  EXAM: CHEST  1 VIEW  COMPARISON:  03/14/2015  FINDINGS: No pneumothorax. Mild atelectasis medially at the right lung base and the right perihilar region. Prior blunting of the right costophrenic angle resolved. There is a moderate left pleural effusion silhouetting the left heart border.  Borderline enlargement of the cardiopericardial silhouette.  IMPRESSION: 1. Resolution of prior right pleural effusion, with some residual medial right-sided atelectasis. Increase  in left pleural effusion, currently moderate in size, with associated passive atelectasis.   Electronically Signed   By: Van Clines M.D.   On: 03/17/2015 14:56   Dg Chest 1 View  03/03/2015   CLINICAL DATA:  Post right thoracentesis  EXAM: CHEST  1 VIEW  COMPARISON:  Chest x-ray of 02/21/2015  FINDINGS: Some of the right  pleural effusion has been evacuated. No obvious pneumothorax is seen, but on the frontal view there is a fluid level at the right lung base and a small right hydro pneumothorax is suspected. Opacity at the left lung base persists consistent with left effusion, atelectasis, and possibly pneumonia. Heart size is stable.  IMPRESSION: 1. Suspect small right hydro pneumothorax after right thoracentesis. 2. Reduction of right pleural effusion.   Electronically Signed   By: Ivar Drape M.D.   On: 03/03/2015 16:17   Dg Chest 2 View  03/13/2015   CLINICAL DATA:  Cough  EXAM: CHEST  2 VIEW  COMPARISON:  March 03, 2015 chest radiograph and chest CT February 21, 2015  FINDINGS: There is a small pleural effusion on the right. No pneumothorax appreciable. There is mild bibasilar atelectasis. Lungs are otherwise clear. Heart size and pulmonary vascularity are normal. No adenopathy. There is evidence of sclerosis in several ribs consistent with prior rib trauma. No acute fracture apparent. Nipple shadows are noted bilaterally.  IMPRESSION: Small right pleural effusion. Mild bibasilar atelectasis. No airspace consolidation or edema.   Electronically Signed   By: Lowella Grip III M.D.   On: 03/13/2015 09:20   Dg Chest 2 View  02/21/2015   CLINICAL DATA:  Chest pain and shortness of breath. Negative pregnancy test.  EXAM: CHEST  2 VIEW  COMPARISON:  12/05/2014  FINDINGS: Lungs are adequately inflated and demonstrate small to moderate bilateral pleural effusions right greater than left which are new. Likely associated atelectasis in the lung bases as basilar infection is also possible person  cardiomediastinal silhouette and remainder of the exam is unchanged.  IMPRESSION: Small to moderate bilateral pleural effusions right greater than left. Likely associated bibasilar atelectasis versus infection.   Electronically Signed   By: Marin Olp M.D.   On: 02/21/2015 14:54   Ct Angio Chest Pe W/cm &/or Wo Cm  03/20/2015   CLINICAL DATA:  Shortness of breath. Tachycardia. Elevated D-dimer.  EXAM: CT ANGIOGRAPHY CHEST WITH CONTRAST  TECHNIQUE: Multidetector CT imaging of the chest was performed using the standard protocol during bolus administration of intravenous contrast. Multiplanar CT image reconstructions and MIPs were obtained to evaluate the vascular anatomy.  CONTRAST:  62mL OMNIPAQUE IOHEXOL 350 MG/ML SOLN  COMPARISON:  02/21/2015  FINDINGS: Technically adequate study with good opacification of the central and segmental pulmonary arteries. No focal filling defects. No evidence of significant pulmonary embolus.  Normal heart size. Normal caliber thoracic aorta without evidence of dissection. Great vessel origins are patent. Esophagus is decompressed. No significant lymphadenopathy in the mediastinum. Again demonstrated is lymphadenopathy in the right axilla with enlarged lymph nodes measuring up to about 15 mm short axis dimension. Large left thyroid gland nodule measuring up to 3.5 cm diameter. No change since prior study.  Moderate size bilateral pleural effusions with atelectasis or consolidation in both lung bases. This is similar to prior study. No pneumothorax.  Included portion of upper abdominal organs is unremarkable except for a small accessory spleen. Degenerative changes in the spine. No destructive bone lesions.  Review of the MIP images confirms the above findings.  IMPRESSION: No evidence of significant pulmonary embolus. Again demonstrated are moderate bilateral pleural effusions with atelectasis or consolidation in the lung bases. Axillary lymphadenopathy. Appearance is nonspecific  and follow-up suggested. Left thyroid gland nodule. Follow-up ultrasound has been suggested.   Electronically Signed   By: Lucienne Capers M.D.   On: 03/20/2015 04:28   Ct Angio Chest W/cm &/or Wo  Cm  02/21/2015   CLINICAL DATA:  3 day history of bilateral lower extremity edema, cough, and shortness of breath with intermittent stabbing chest pain radiating to the back.  EXAM: CT ANGIOGRAPHY CHEST WITH CONTRAST  TECHNIQUE: Multidetector CT imaging of the chest was performed using the standard protocol during bolus administration of intravenous contrast. Multiplanar CT image reconstructions and MIPs were obtained to evaluate the vascular anatomy.  CONTRAST:  162mL OMNIPAQUE IOHEXOL 350 MG/ML IV.  COMPARISON:  No prior CT. Two-view chest x-ray earlier same date and 12/05/2014.  FINDINGS: Contrast opacification of the pulmonary arteries is good respiratory motion blurred many of the images of the lung bases. Overall, the study is of good diagnostic quality.  No filling defects within either main pulmonary artery or their branches in either lung to suggest pulmonary embolism. Heart size upper normal. Small pericardial effusion. No visible atherosclerosis involving the thoracic or upper abdominal aorta or their visualized branches. No visible coronary artery atherosclerosis.  Large bilateral pleural effusions including of fluid in the major fissure on the left. Associated dense passive atelectasis in the lower lobes and right middle lobe. No confluent airspace consolidation otherwise. No pulmonary parenchymal nodules or masses. Central airways patent with marked bronchial wall thickening.  Enlarged level 1 and level 2 right axillary lymph nodes, the largest measuring approximately 2.5 x 1.4 cm. No significant mediastinal, hilar or left axillary lymphadenopathy. Mass involving the lower pole of the left lobe of the thyroid gland measuring approximately 4.0 x 3.1 x 4.2 cm.  Visualized upper abdomen unremarkable for the  early arterial phase of enhancement. Focus of accessory splenic tissue posterior to the mid spleen. Bone window images demonstrate a likely benign sclerotic bone island in the T6 vertebral body.  Review of the MIP images confirms the above findings.  IMPRESSION: 1. No evidence of pulmonary embolism. 2. Large bilateral pleural effusions including fluid in the major fissure on the left with associated passive atelectasis in the lower lobes and right middle lobe. 3. Marked central bronchial wall thickening consistent with severe bronchitis and/or asthma. 4. Small pericardial effusion. 5. Enlarged right axillary lymph nodes without evidence of lymphadenopathy elsewhere. Isolated unilateral axillary lymphadenopathy can be seen in breast cancer, so if not recently performed, diagnostic mammography may be helpful in further evaluation. 6. Approximate 4 cm mass involving the lower pole of the left lobe of the thyroid gland. Based on size, evaluation with ultrasound and likely biopsy is indicated. This follows ACR consensus guidelines: Managing Incidental Thyroid Nodules Detected on Imaging: White Paper of the ACR Incidental Thyroid Findings Committee. J Am Coll Radiol 2015; 12:143-150.   Electronically Signed   By: Evangeline Dakin M.D.   On: 02/21/2015 19:06   US Soft Tissue Head/neck  02/25/2015   CLINICAL DATA:  Thyroid nodule.  EXAM: THYROID ULTRASOUND  TECHNIQUE: Ultrasound examination of the thyroid gland and adjacent soft tissues was performed.  COMPARISON:  Chest CT 02/21/2015  FINDINGS: Right thyroid lobe  Measurements: 5.8 x 1.8 x 1.9 cm. There is a subtle heterogeneous nodule along the medial right thyroid lobe that measures 0.6 x 0.3 x 0.7 cm. There is a heterogeneous nodule in the inferior right thyroid lobe that measures 1.0 x 0.5 x 0.8 cm.  Left thyroid lobe  Measurements: 7.1 x 2.7 x 2.6 cm. There is a dominant nodule occupying the mid and lower aspect of the left thyroid lobe. This lesion corresponds  with the nodule seen on the recent CT. This dominant left thyroid nodule  measures 4.7 x 2.5 x 4.1 cm. Nodule is heterogeneous with small cystic areas. Cannot exclude small calcifications in this nodule.  Isthmus  Thickness: 0.4 cm.  No nodules visualized.  Lymphadenopathy  None visualized.  IMPRESSION: Bilateral thyroid nodules. There is a dominant left thyroid nodule that measures up to 4.7 cm. Findings meet consensus criteria for biopsy. Ultrasound-guided fine needle aspiration should be considered, as per the consensus statement: Management of Thyroid Nodules Detected at [place]: Society of Radiologists in [place]. Radiology 2005; [id number].   Electronically Signed   By: Markus Daft M.D.   On: 02/25/2015 16:21   US Thyroid Biopsy  03/10/2015   CLINICAL DATA:  44 year old female with a history of left-sided thyroid nodule. She has been referred for percutaneous biopsy.  EXAM: ULTRASOUND GUIDED NEEDLE ASPIRATE BIOPSY OF THE THYROID GLAND  COMPARISON:  02/25/2015  PROCEDURE: The procedure, risks, benefits, and alternatives were explained to the patient. Questions regarding the procedure were encouraged and answered. The patient understands and consents to the procedure.  Ultrasound survey was performed with images stored and sent to PACs.  The left neck was prepped with Betadine in a sterile fashion, and a sterile drape was applied covering the operative field. A sterile gown and sterile gloves were used for the procedure. Local anesthesia was provided with 1% Lidocaine.  Ultrasound guidance was used to infiltrate the region with 1% lidocaine for local anesthesia. Four separate 25 gauge fine needle biopsy were then acquired of the left thyroid nodule using ultrasound guidance. Images were stored.  Slide preparation was performed.  Final image was stored after biopsy.  Patient tolerated the procedure well and remained hemodynamically stable throughout.  No complications were  encountered and no significant blood loss was encounter  FINDINGS: Ultrasound survey of the left thyroid demonstrates heterogeneous nodule inferiorly.  Images during the case demonstrate needle tip within the nodule on each needle pass.  Final ultrasound image demonstrates no complicating features.  IMPRESSION: Status post ultrasound-guided biopsy of left inferior thyroid nodule. Specimen sent to pathology for complete histopathologic analysis.  Signed,  Dulcy Fanny. Earleen Newport, DO  [facility]  [facility]   Electronically Signed   By: Corrie Mckusick D.O.   On: 03/10/2015 14:44   Dg Chest Port 1 View  03/20/2015   CLINICAL DATA:  Walking Corpse syndrome  EXAM: PORTABLE CHEST - 1 VIEW  COMPARISON:  03/17/2015  FINDINGS: The cardiac enlargement with pulmonary vascular congestion and perihilar edema, increasing since prior study. Small to moderate left pleural effusion is also increasing. No pneumothorax.  IMPRESSION: Increasing pulmonary vascular congestion, edema, and left pleural effusion since prior study.   Electronically Signed   By: Lucienne Capers M.D.   On: 03/20/2015 00:11   Dg Chest Port 1 View  03/14/2015   CLINICAL DATA:  Abnormal labs, suspicious for infection.  EXAM: PORTABLE CHEST - 1 VIEW  COMPARISON:  03/12/2015  FINDINGS: There is persistent right pleural fluid or thickening in the lateral base. Mild linear basilar opacities persist and may represent scarring. Lungs are otherwise clear. Hilar, mediastinal and cardiac contours are unremarkable and unchanged. Pulmonary vasculature is normal.  IMPRESSION: Unchanged, would minimal linear basilar opacities and right lateral pleural fluid/thickening.   Electronically Signed   By: Andreas Newport M.D.   On: 03/14/2015 03:09   Dg Knee Complete 4 Views Right  03/05/2015   CLINICAL DATA:  Acute onset of right knee pain and swelling. Initial encounter.  EXAM: RIGHT  KNEE - COMPLETE 4+ VIEW  COMPARISON:  None.   FINDINGS: There is no evidence of fracture or dislocation. The joint spaces are preserved. No significant degenerative change is seen; the patellofemoral joint is grossly unremarkable in appearance.  A small knee joint effusion is noted. The visualized soft tissues are otherwise unremarkable.  IMPRESSION: 1. No evidence of fracture or dislocation. 2. Small knee joint effusion noted.   Electronically Signed   By: Garald Balding M.D.   On: 03/05/2015 22:31   Dg Hand Complete Right  03/05/2015   CLINICAL DATA:  Bilateral hand pain and swelling. Initial encounter.  EXAM: RIGHT HAND - COMPLETE 3+ VIEW  COMPARISON:  None.  FINDINGS: There is no evidence of fracture or dislocation. The joint spaces are preserved. The carpal rows are intact, and demonstrate normal alignment. Mild dorsal soft tissue swelling is noted about the wrist.  IMPRESSION: No evidence of fracture or dislocation.   Electronically Signed   By: Garald Balding M.D.   On: 03/05/2015 22:30   Dg Knee Ap/lat W/sunrise Left  03/19/2015   CLINICAL DATA:  Pain and swelling.  No appreciable trauma  EXAM: LEFT KNEE 3 VIEWS  COMPARISON:  None.  FINDINGS: Frontal, lateral, and sunrise patellar images were obtained. There is no fracture or dislocation. Joint spaces appear intact. No effusion. No erosive change.  IMPRESSION: No fracture or effusion.  No appreciable arthropathy.   Electronically Signed   By: Lowella Grip III M.D.   On: 03/19/2015 13:55   Dg Knee Ap/lat W/sunrise Right  03/19/2015   CLINICAL DATA:  Pain and swelling.  No history of trauma  EXAM: RIGHT KNEE 3 VIEWS  COMPARISON:  March 05, 2015  FINDINGS: Frontal, lateral, and sunrise patellar images were obtained. No fracture or dislocation. No appreciable joint effusion. Joint spaces appear intact. No erosive change.  IMPRESSION: No fracture or effusion.  No appreciable arthropathy.   Electronically Signed   By: Lowella Grip III M.D.   On: 03/19/2015 13:57   US Thoracentesis Asp  Pleural Space W/img Guide  03/17/2015   INDICATION: Symptomatic R sided pleural effusion  EXAM: US THORACENTESIS ASP PLEURAL SPACE W/IMG GUIDE  COMPARISON:  None.  MEDICATIONS: 10 cc 1% lidocaine  COMPLICATIONS: None immediate  TECHNIQUE: Informed written consent was obtained from the patient after a discussion of the risks, benefits and alternatives to treatment. A timeout was performed prior to the initiation of the procedure.  Initial ultrasound scanning demonstrates a right pleural effusion. The lower chest was prepped and draped in the usual sterile fashion. 1% lidocaine was used for local anesthesia.  Under direct ultrasound guidance, a 19 gauge, 7-cm, Yueh catheter was introduced. An ultrasound image was saved for documentation purposes. the thoracentesis was performed. The catheter was removed and a dressing was applied. The patient tolerated the procedure well without immediate post procedural complication. The patient was escorted to have an upright chest radiograph.  FINDINGS: A total of approximately 580 cc of yellow fluid was removed. Requested samples were sent to the laboratory.  IMPRESSION: Successful ultrasound-guided R sided thoracentesis yielding 580 cc of pleural fluid.  Read by:  Lavonia Drafts [facility]   Electronically Signed   By: Sandi Mariscal M.D.   On: 03/17/2015 15:13   US Thoracentesis Asp Pleural Space W/img Guide  03/03/2015   INDICATION: Dyspnea, bilateral pleural effusions; request is made for diagnostic and therapeutic right thoracentesis.  EXAM: ULTRASOUND GUIDED DIAGNOSTIC AND THERAPEUTIC RIGHT THORACENTESIS  COMPARISON:  None.  MEDICATIONS: None  COMPLICATIONS: None immediate  TECHNIQUE: Informed written consent was obtained from the patient via interpreter after a discussion of the risks, benefits and alternatives to treatment. A timeout was performed prior to the initiation of the procedure.  Initial ultrasound scanning demonstrates a pleural effusion. The lower chest was prepped  and draped in the usual sterile fashion. 1% lidocaine was used for local anesthesia.  An ultrasound image was saved for documentation purposes. A 6 Fr Safe-T-Centesis catheter was introduced. The thoracentesis was performed. The catheter was removed and a dressing was applied. The patient tolerated the procedure well without immediate post procedural complication. The patient was escorted to have an upright chest radiograph.  FINDINGS: A total of approximately 360 cc's of slightly turbid ,yellow fluid was removed. Requested samples were sent to the laboratory.  IMPRESSION: Successful ultrasound-guided diagnostic and therapeutic right sided thoracentesis yielding 360 cc's of pleural fluid.  Read by: Rowe Robert, PA-C   Electronically Signed   By: Markus Daft M.D.   On: 03/03/2015 16:41    CBC  Recent Labs Lab 03/15/15 1430 03/16/15 0745 03/19/15 0612 03/20/15 0509 03/21/15 0540  WBC 3.8* 4.2 4.2 4.7 3.8*  HGB 8.3* 8.3* 8.0* 8.2* 8.0*  HCT 24.9* 25.4* 24.5* 25.6* 25.1*  PLT 434* 423* 417* 395 425*  MCV 82.5 82.7 83.6 83.9 84.2  MCH 27.5 27.0 27.3 26.9 26.8  MCHC 33.3 32.7 32.7 32.0 31.9  RDW 13.2 13.4 13.8 14.0 14.0  LYMPHSABS 1.0  --   --   --   --   MONOABS 0.3  --   --   --   --   EOSABS 0.1  --   --   --   --   BASOSABS 0.0  --   --   --   --     Chemistries   Recent Labs Lab 03/15/15 0940 03/16/15 0745 03/19/15 0612 03/20/15 0509 03/21/15 0540  NA 134* 138 136 138 138  K 3.8 3.6 3.7 3.6 3.4*  CL 107 108 106 106 105  CO2 20 22 21 23 23   GLUCOSE 166* 111* 124* 114* 95  BUN <5* <5* <5* <5* <5*  CREATININE 0.66 0.55 0.50 0.46* 0.54  CALCIUM 8.1* 8.1* 7.9* 8.4 7.9*  AST 41* 37 24  --   --   ALT 28 24 14   --   --   ALKPHOS 71 68 63  --   --   BILITOT 0.5 0.2* 0.3  --   --    ------------------------------------------------------------------------------------------------------------------ estimated creatinine clearance is 71.7 mL/min (by C-G formula based on Cr of  0.54). ------------------------------------------------------------------------------------------------------------------ No results for input(s): HGBA1C in the last 72 hours. ------------------------------------------------------------------------------------------------------------------ No results for input(s): CHOL, HDL, LDLCALC, TRIG, CHOLHDL, LDLDIRECT in the last 72 hours. ------------------------------------------------------------------------------------------------------------------ No results for input(s): TSH, T4TOTAL, T3FREE, THYROIDAB in the last 72 hours.  Invalid input(s): FREET3 ------------------------------------------------------------------------------------------------------------------  Recent Labs  03/21/15 0540  RETICCTPCT 2.1    Coagulation profile No results for input(s): INR, PROTIME in the last 168 hours.   Recent Labs  03/20/15 0009  DDIMER 7.80*    Cardiac Enzymes No results for input(s): CKMB, TROPONINI, MYOGLOBIN in the last 168 hours.  Invalid input(s): CK ------------------------------------------------------------------------------------------------------------------ Invalid input(s): POCBNP    Iylah Dworkin D.O. on 03/21/2015 at 12:52 PM  Between 7am to 7pm - Pager - [phone]  After 7pm go to [email or web address] - password [email or web address]  And look for the night coverage person covering for me after hours  [facility]  Group Office  [phone]

## 2015-03-22 LAB — BODY FLUID CULTURE
Culture: NO GROWTH
SPECIAL REQUESTS: NORMAL

## 2015-03-22 LAB — VITAMIN B12: Vitamin B-12: 239 pg/mL (ref 211–911)

## 2015-03-22 LAB — FOLATE: FOLATE: 9.4 ng/mL

## 2015-03-22 LAB — FERRITIN: FERRITIN: 190 ng/mL (ref 10–291)

## 2015-03-22 MED ORDER — MEGESTROL ACETATE 40 MG PO TABS
40.0000 mg | ORAL_TABLET | Freq: Every day | ORAL | Status: DC
  Administered 2015-03-22 – 2015-03-26 (×5): 40 mg via ORAL
  Filled 2015-03-22 (×6): qty 1

## 2015-03-22 MED ORDER — ACETAMINOPHEN 650 MG RE SUPP: 650.0000 mg | RECTAL | Status: DC | PRN

## 2015-03-22 MED ORDER — ALBUMIN HUMAN 25 % IV SOLN
12.5000 g | Freq: Once | INTRAVENOUS | Status: AC
  Administered 2015-03-22: 12.5 g via INTRAVENOUS
  Filled 2015-03-22: qty 50

## 2015-03-22 MED ORDER — ACETAMINOPHEN 325 MG PO TABS
650.0000 mg | ORAL_TABLET | ORAL | Status: DC | PRN
  Administered 2015-03-22: 650 mg via ORAL
  Filled 2015-03-22: qty 2

## 2015-03-22 MED ORDER — ENSURE PUDDING PO PUDG
1.0000 | Freq: Three times a day (TID) | ORAL | Status: DC
  Administered 2015-03-22 – 2015-03-26 (×12): 1 via ORAL

## 2015-03-22 MED ORDER — CETYLPYRIDINIUM CHLORIDE 0.05 % MT LIQD
7.0000 mL | Freq: Two times a day (BID) | OROMUCOSAL | Status: DC
  Administered 2015-03-22 – 2015-03-26 (×7): 7 mL via OROMUCOSAL

## 2015-03-22 MED ORDER — FUROSEMIDE 10 MG/ML IJ SOLN
10.0000 mg | Freq: Once | INTRAMUSCULAR | Status: AC
Start: 2015-03-22 — End: 2015-03-22
  Administered 2015-03-22: 10 mg via INTRAVENOUS
  Filled 2015-03-22: qty 2

## 2015-03-22 MED ORDER — IBUPROFEN 200 MG PO TABS
600.0000 mg | ORAL_TABLET | ORAL | Status: DC | PRN
  Administered 2015-03-22 – 2015-03-26 (×9): 600 mg via ORAL
  Filled 2015-03-22 (×9): qty 3

## 2015-03-22 MED ORDER — OXYCODONE HCL 5 MG PO TABS
5.0000 mg | ORAL_TABLET | ORAL | Status: DC | PRN
  Administered 2015-03-22 (×2): 5 mg via ORAL
  Filled 2015-03-22 (×2): qty 1

## 2015-03-22 MED ORDER — CYANOCOBALAMIN 1000 MCG/ML IJ SOLN
1000.0000 ug | Freq: Once | INTRAMUSCULAR | Status: DC
  Filled 2015-03-22: qty 1

## 2015-03-22 MED ORDER — SENNOSIDES-DOCUSATE SODIUM 8.6-50 MG PO TABS
1.0000 | ORAL_TABLET | Freq: Every day | ORAL | Status: DC
  Administered 2015-03-22 – 2015-03-25 (×4): 1 via ORAL
  Filled 2015-03-22 (×4): qty 1

## 2015-03-22 MED ORDER — SODIUM CHLORIDE 0.9 % IV SOLN
510.0000 mg | Freq: Once | INTRAVENOUS | Status: AC
  Administered 2015-03-22: 510 mg via INTRAVENOUS
  Filled 2015-03-22: qty 17

## 2015-03-22 MED ORDER — CYANOCOBALAMIN 1000 MCG/ML IJ SOLN
1000.0000 ug | Freq: Once | INTRAMUSCULAR | Status: AC
  Administered 2015-03-22: 1000 ug via INTRAMUSCULAR
  Filled 2015-03-22: qty 1

## 2015-03-22 NOTE — Progress Notes (Addendum)
[facility]                                                                              Patient Demographics  Nichole Perry, is a 44 y.o. female, DOB - May 18, 1971, [id number]  Admit date - 03/14/2015   Admitting Physician Lavina Hamman, MD  Outpatient Primary MD for the patient is Maximino Greenland, MD  LOS - 8   Chief Complaint  Patient presents with  . Abnormal Lab      Brief history 44 year old female with a history of goiter and anemia presented to the hospital when she was called by her primary care provider with positive blood cultures. The patient has not been feeling well for nearly 3 weeks. The patient initially presented to her primary care provider because of bilateral knee pain and leg swelling on 02/21/2015. She was started on furosemide 20 mg daily. Chest x-ray at that time revealed small to moderate bilateral pleural effusions. CT angio of the chest was performed on 02/21/2015 which was negative for pulmonary embolus but revealed bilateral pleural effusions and central bronchial wall thickening and enlarged right axillary lymph node. She underwent thoracocentesis on 03/03/2015. Fluid chemistries revealed an exudative pleural effusion with WBC 3000 (64% monos, 28% PMN, 8% eosinophils). Routine Fluid culture and AFB culture are negative at this time. 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 The patient followed up on 03/07/2015, and an autoimmune workup was begun which was negative. Again, the patient represented to the primary care provider office on 03/12/2015 with 2 day history of fever up to 101.60F. Blood cultures were obtained at that time are growing CoNS. Notably, the patient also was noted to have a right thyroid nodule which has been biopsied on 03/10/2015. The biopsy revealed benign follicular nodule. Since admission, the patient has continued to have fevers. Workup continues in progress.  Assessment & Plan   Sepsis -Improving. Fever 102.26F over the past   24hours -Secondary to bacteremia ? Vs TB -Lactic acid 2.22 on 03/14/2015 -Continue IV cefazolin -flu is neg -Given TB meds, will not use tylenol for fever. Motrin ordered.   Dyspnea  -Resolved, patient became tachypneic with tachycardia overnight on 4/7-8 -D-dimer was noted to be elevated, CT chest was conducted showing no pulmonary embolism however moderate bilateral pleural effusions. -Patient was given a dose of Lasix, dyspnea has improved -Currently remains on 2 L oxygen via nasal cannula -IVF DISCONTINUED (not sure why IVF were restarted overnight)  Bacteremia-- Coag negative Staph  -unclear presently if true bacteremia vs contaminant as 4/2 repeat blood cultures remain negative to date (had not yet started antibiotics) -may be partly contributing to clinical picture, but doubt is the entire answer -ID following -Echocardiogram: EF 60%, no gross vegetation -follow surveillance blood cultures--neg to date -TEE cannot be done at this time as patient is on airborne precautions -Continue cefazolin day 8 (for 14 days total)  QuantiFeron Positive/Exudative pleural effusion -03/17/15 repeat thoracocentesis WBC 797 (75% lymphs), glucose 150-- MTB PCR, ADA, glucose, cytology/ culture show no growth -given pt's clinical and epidemiologic hx, will still need to r/o pulmonary TB in setting of lymphocytic effusion -airborne isolation--await sputum although pt is barely coughing and unable to induce sputum -Spoke  with Pulm regarding possible VATS (pleural bx)- however no longer available; Spoke with [facility], no longer done there either, may need CT surg -General surgery consulted and appreciated, s/p Right axillary lymph node biopsy- cytology pending -Spoke with ID (quantiferon + in March 2016)- TB treatment started 4/9- Rifampin, Isoniazid, Pyridoxine, ethambutol with Vitamin B6  Transaminasemia -Resolved, secondary to sepsis -No abdominal pain, no vomiting -Hepatitis B surface antigen,  hepatitis C antibody--negative -HIV antibody is negative  Bilateral pleural effusion -03/03/2015 thoracocentesis--exudative -WBC 3000 (64% monos, 28% PMN, 8% eosinophils)  Arthralgias and myalgias/ Lower extremity and hand edema and pain -Autoimmune workup has been negative including C3, C4, anti-CCP, ANA, RF, ENA, anti-dsDNA, Anti-SmAb -Lower ext doppler negative for DVT -improving with standard antibiotics -Continues to have swelling in upper ext and knees- improved -Consulted orthopedics- conducted aspiration of right knee at bedside -Right knee fluid culture: no organisms seen; no yeast or fungal elements -knee pain seems have to have improved, but complains of pain andw welling in hands  Normocytic Anemia -Likely chronic. Hb upon admission 10, remaining stable at 8 -Drop in hb likely dilutional as patient has been on IVF -Anemia panel: Iron 13, Ferritin 190, Vit B12 239 -Will supplement Iron and B12  Hypoalbuminemia/malnutrition -Nutrition consulted -will start low dose megace -Will give one dose of albumin  Code Status: Full  Family Communication: Husband at bedside  Disposition Plan: Admitted.  Will continue to monitor as patient continues to spike fevers,  Started on TB treatment.  On 03/19/2015, Spoke with [facility] and [facility]- had no recommendations and did not feel patient needed to transfer.   Time Spent in minutes   30 minutes  Procedures  Echocardiogram US Thoracentesis Right knee aspiration Right axillary lymph node biopsy  Consults   Pulmonology Interventional radiology Infectious disease Orthopedic surgery General surgery  DVT Prophylaxis  SCDs  Lab Results  Component Value Date   PLT 425* 03/21/2015    Medications  Scheduled Meds: . antiseptic oral rinse  7 mL Mouth Rinse BID  .  ceFAZolin (ANCEF) IV  2 g Intravenous 3 times per day  . ethambutol  15 mg/kg Oral Daily  . feeding supplement (RESOURCE BREEZE)  1 Container Oral TID BM  .  isoniazid  300 mg Oral Daily  . megestrol  40 mg Oral Daily  . multivitamin with minerals  1 tablet Oral Daily  . pyrazinamide  1,000 mg Oral Daily  . vitamin B-6  50 mg Oral Daily  . rifampin  600 mg Oral Daily   Continuous Infusions: . lactated ringers 10 mL/hr at 03/20/15 1629   PRN Meds:.HYDROcodone-acetaminophen, ibuprofen  Antibiotics    Anti-infectives    Start     Dose/Rate Route Frequency Ordered Stop   03/21/15 1300  pyrazinamide tablet 1,000 mg     1,000 mg Oral Daily 03/21/15 1200     03/21/15 1230  ethambutol (MYAMBUTOL) tablet 850 mg     15 mg/kg  57.2 kg Oral Daily 03/21/15 1200     03/21/15 1230  rifampin (RIFADIN) capsule 600 mg     600 mg Oral Daily 03/21/15 1200     03/21/15 1230  isoniazid (NYDRAZID) tablet 300 mg     300 mg Oral Daily 03/21/15 1200     03/14/15 1445  ceFAZolin (ANCEF) IVPB 2 g/50 mL premix     2 g 100 mL/hr over 30 Minutes Intravenous 3 times per day 03/14/15 1426     03/14/15 1400  piperacillin-tazobactam (ZOSYN) IVPB  3.375 g  Status:  Discontinued     3.375 g 12.5 mL/hr over 240 Minutes Intravenous 3 times per day 03/14/15 1319 03/14/15 1426   03/14/15 0830  piperacillin-tazobactam (ZOSYN) IVPB 3.375 g  Status:  Discontinued     3.375 g 12.5 mL/hr over 240 Minutes Intravenous Every 8 hours 03/14/15 0217 03/14/15 0651   03/14/15 0230  vancomycin (VANCOCIN) IVPB 750 mg/150 ml premix  Status:  Discontinued     750 mg 150 mL/hr over 60 Minutes Intravenous Every 12 hours 03/14/15 0217 03/16/15 1151   03/14/15 0215  piperacillin-tazobactam (ZOSYN) IVPB 3.375 g     3.375 g 100 mL/hr over 30 Minutes Intravenous  Once 03/14/15 0213 03/14/15 0323        Subjective:   [name] seen and examined today.  Patient feels better today, however, complains of swelling in her hands and the inability to make a fist.  She denies knee pain at this time.  Per husband, patient has always had a poor appetite and did not eat well prior to admission.   Denies chest pain, shortness of breath, dizziness, headache, abdominal pain, nausea, vomiting.    Objective:   Filed Vitals:   03/22/15 0300 03/22/15 0423 03/22/15 0646 03/22/15 1055  BP:      Pulse: 106 99 102   Temp:  99.6 F (37.6 C)  99 F (37.2 C)  TempSrc:  Oral  Oral  Resp: 20 20 20    Height:      Weight:  57.153 kg (126 lb)    SpO2: 99% 100% 100%     Wt Readings from Last 3 Encounters:  03/22/15 57.153 kg (126 lb)  03/12/15 52.527 kg (115 lb 12.8 oz)  03/05/15 53.524 kg (118 lb)     Intake/Output Summary (Last 24 hours) at 03/22/15 1120 Last data filed at 03/22/15 0600  Gross per 24 hour  Intake    900 ml  Output    300 ml  Net    600 ml    Exam  General: Well developed, NAD  Cardiovascular: S1 S2 auscultated,RRR, no murmus  Respiratory: Better air movement, but diminished breath sounds  Abdomen: Soft, nontender, nondistended, + bowel sounds  Extremities: trace B/L ankle.  Hand edema improved.   Neuro: AAOx3, nonfocal  Psych: Pleasant, appropriate mood and affect  Data Review   Micro Results Recent Results (from the past 240 hour(s))  Culture, blood (single)     Status: None   Collection Time: 03/12/15  1:25 PM  Result Value Ref Range Status   Culture STAPHYLOCOCCUS SPECIES (COAGULASE NEGATIVE)  Final   Organism ID, Bacteria STAPHYLOCOCCUS SPECIES (COAGULASE NEGATIVE)  Final    Comment: Rifampin and Gentamicin should not be used as single drugs for treatment of Staph infections. This organism DOES NOT demonstrate inducible Clindamycin resistance in vitro. Gram Stain Report Called to,Read Back By and Verified With: DR. Reginia Forts 03/13/15 AT 2300 RIDK       Susceptibility   Staphylococcus species (coagulase negative) -  (no method available)    PENICILLIN 0.25 Resistant     OXACILLIN <=0.25 Sensitive     CEFAZOLIN  Sensitive     GENTAMICIN <=0.5 Sensitive     CIPROFLOXACIN <=0.5 Sensitive     LEVOFLOXACIN <=0.12 Sensitive      TRIMETH/SULFA <=10 Sensitive     VANCOMYCIN 1 Sensitive     CLINDAMYCIN <=0.25 Sensitive     ERYTHROMYCIN >=8 Resistant     RIFAMPIN <=0.5 Sensitive  TETRACYCLINE <=1 Sensitive   Culture, blood (single)     Status: None   Collection Time: 03/12/15  1:25 PM  Result Value Ref Range Status   Organism ID, Bacteria STAPHYLOCOCCUS SPECIES (COAGULASE NEGATIVE)  Final    Comment: Susceptibilities performed on previous culture within the last 5 days. Gram Stain Report Called to,Read Back By and Verified With: DR. Reginia Forts 03/13/15 AT 2230 [place]   Blood Culture (routine x 2)     Status: None   Collection Time: 03/14/15  2:39 AM  Result Value Ref Range Status   Specimen Description BLOOD RIGHT WRIST  Final   Special Requests BOTTLES DRAWN AEROBIC ONLY 4CC  Final   Culture   Final    NO GROWTH 5 DAYS Performed at [facility]    Report Status 03/20/2015 FINAL  Final  Blood Culture (routine x 2)     Status: None   Collection Time: 03/14/15  2:46 AM  Result Value Ref Range Status   Specimen Description BLOOD LEFT ARM  Final   Special Requests BOTTLES DRAWN AEROBIC AND ANAEROBIC 5CC  Final   Culture   Final    NO GROWTH 5 DAYS Performed at [facility]    Report Status 03/20/2015 FINAL  Final  Urine culture     Status: None   Collection Time: 03/14/15  8:43 AM  Result Value Ref Range Status   Specimen Description URINE, RANDOM  Final   Special Requests NONE  Final   Colony Count   Final    1,000 COLONIES/ML Performed at [facility]    Culture   Final    INSIGNIFICANT GROWTH Performed at [facility]    Report Status 03/16/2015 FINAL  Final  Culture, blood (routine x 2)     Status: None   Collection Time: 03/15/15  9:40 AM  Result Value Ref Range Status   Specimen Description BLOOD LEFT ARM  Final   Special Requests BOTTLES DRAWN AEROBIC ONLY 10 CC  Final   Culture   Final    NO GROWTH 5 DAYS Note: Culture results may be compromised due  to an excessive volume of blood received in culture bottles. Performed at [facility]    Report Status 03/21/2015 FINAL  Final  Culture, blood (routine x 2)     Status: None   Collection Time: 03/15/15  9:45 AM  Result Value Ref Range Status   Specimen Description BLOOD LEFT WRIST  Final   Special Requests BOTTLES DRAWN AEROBIC ONLY 10 CC  Final   Culture   Final    NO GROWTH 5 DAYS Note: Culture results may be compromised due to an excessive volume of blood received in culture bottles. Performed at [facility]    Report Status 03/21/2015 FINAL  Final  Body fluid culture     Status: None   Collection Time: 03/17/15  2:27 PM  Result Value Ref Range Status   Specimen Description FLUID RIGHT PLEURAL  Final   Special Requests NONE  Final   Gram Stain   Final    RARE WBC PRESENT,BOTH PMN AND MONONUCLEAR NO ORGANISMS SEEN Performed at [facility]    Culture   Final    NO GROWTH 3 DAYS Performed at [facility]    Report Status 03/21/2015 FINAL  Final  Fungus Culture with Smear     Status: None (Preliminary result)   Collection Time: 03/19/15 12:18 PM  Result Value Ref Range Status  Specimen Description FLUID SYNOVIAL RIGHT KNEE  Final   Special Requests Normal  Final   Fungal Smear   Final    NO YEAST OR FUNGAL ELEMENTS SEEN Performed at [facility]    Culture   Final    CULTURE IN PROGRESS FOR FOUR WEEKS Performed at [facility]    Report Status PENDING  Incomplete  Body fluid culture     Status: None   Collection Time: 03/19/15 12:19 PM  Result Value Ref Range Status   Specimen Description FLUID SYNOVIAL RIGHT KNEE  Final   Special Requests Normal  Final   Gram Stain   Final    MODERATE WBC PRESENT, PREDOMINANTLY PMN NO ORGANISMS SEEN Performed at [facility]    Culture   Final    NO GROWTH 3 DAYS Performed at [facility]    Report Status 03/22/2015 FINAL  Final  AFB culture with smear      Status: None (Preliminary result)   Collection Time: 03/19/15 12:19 PM  Result Value Ref Range Status   Specimen Description FLUID SYNOVIAL RIGHT KNEE  Final   Special Requests Normal  Final   Acid Fast Smear   Final    NO ACID FAST BACILLI SEEN Performed at [facility]    Culture   Final    CULTURE WILL BE EXAMINED FOR 6 WEEKS BEFORE ISSUING A FINAL REPORT Performed at [facility]    Report Status PENDING  Incomplete  Culture, blood (routine x 2)     Status: None (Preliminary result)   Collection Time: 03/20/15  5:00 AM  Result Value Ref Range Status   Specimen Description BLOOD LEFT ARM  Final   Special Requests BOTTLES DRAWN AEROBIC AND ANAEROBIC 5ML  Final   Culture   Final           BLOOD CULTURE RECEIVED NO GROWTH TO DATE CULTURE WILL BE HELD FOR 5 DAYS BEFORE ISSUING A FINAL NEGATIVE REPORT Performed at [facility]    Report Status PENDING  Incomplete  Culture, blood (routine x 2)     Status: None (Preliminary result)   Collection Time: 03/20/15  5:10 AM  Result Value Ref Range Status   Specimen Description BLOOD RIGHT ARM  Final   Special Requests BOTTLES DRAWN AEROBIC AND ANAEROBIC 5ML  Final   Culture   Final           BLOOD CULTURE RECEIVED NO GROWTH TO DATE CULTURE WILL BE HELD FOR 5 DAYS BEFORE ISSUING A FINAL NEGATIVE REPORT Performed at [facility]    Report Status PENDING  Incomplete  Tissue culture     Status: None (Preliminary result)   Collection Time: 03/20/15  5:42 PM  Result Value Ref Range Status   Specimen Description TISSUE LYMPH NODE  Final   Special Requests NONE  Final   Gram Stain   Final    FEW WBC PRESENT, PREDOMINANTLY MONONUCLEAR NO ORGANISMS SEEN Performed at [facility]    Culture   Final    NO GROWTH 1 DAY Performed at [facility]    Report Status PENDING  Incomplete  Fungus Culture with Smear     Status: None (Preliminary result)   Collection Time: 03/20/15  5:42 PM  Result  Value Ref Range Status   Specimen Description TISSUE LYMPH NODE  Final   Special Requests NONE  Final   Fungal Smear   Final    NO YEAST OR  FUNGAL ELEMENTS SEEN Performed at [facility]    Culture   Final    CULTURE IN PROGRESS FOR FOUR WEEKS Performed at [facility]    Report Status PENDING  Incomplete  AFB culture with smear     Status: None (Preliminary result)   Collection Time: 03/20/15  5:42 PM  Result Value Ref Range Status   Specimen Description TISSUE LYMPH NODE  Final   Special Requests NONE  Final   Acid Fast Smear   Final    NO ACID FAST BACILLI SEEN Performed at [facility]    Culture   Final    CULTURE WILL BE EXAMINED FOR 6 WEEKS BEFORE ISSUING A FINAL REPORT Performed at [facility]    Report Status PENDING  Incomplete    Radiology Reports Dg Chest 1 View  03/17/2015   CLINICAL DATA:  Right sided thoracentesis, status post removal of 580 cc of yellow fluid. Cough.  EXAM: CHEST  1 VIEW  COMPARISON:  03/14/2015  FINDINGS: No pneumothorax. Mild atelectasis medially at the right lung base and the right perihilar region. Prior blunting of the right costophrenic angle resolved. There is a moderate left pleural effusion silhouetting the left heart border.  Borderline enlargement of the cardiopericardial silhouette.  IMPRESSION: 1. Resolution of prior right pleural effusion, with some residual medial right-sided atelectasis. Increase in left pleural effusion, currently moderate in size, with associated passive atelectasis.   Electronically Signed   By: Van Clines M.D.   On: 03/17/2015 14:56   Dg Chest 1 View  03/03/2015   CLINICAL DATA:  Post right thoracentesis  EXAM: CHEST  1 VIEW  COMPARISON:  Chest x-ray of 02/21/2015  FINDINGS: Some of the right pleural effusion has been evacuated. No obvious pneumothorax is seen, but on the frontal view there is a fluid level at the right lung base and a small right hydro pneumothorax is  suspected. Opacity at the left lung base persists consistent with left effusion, atelectasis, and possibly pneumonia. Heart size is stable.  IMPRESSION: 1. Suspect small right hydro pneumothorax after right thoracentesis. 2. Reduction of right pleural effusion.   Electronically Signed   By: Ivar Drape M.D.   On: 03/03/2015 16:17   Dg Chest 2 View  03/13/2015   CLINICAL DATA:  Cough  EXAM: CHEST  2 VIEW  COMPARISON:  March 03, 2015 chest radiograph and chest CT February 21, 2015  FINDINGS: There is a small pleural effusion on the right. No pneumothorax appreciable. There is mild bibasilar atelectasis. Lungs are otherwise clear. Heart size and pulmonary vascularity are normal. No adenopathy. There is evidence of sclerosis in several ribs consistent with prior rib trauma. No acute fracture apparent. Nipple shadows are noted bilaterally.  IMPRESSION: Small right pleural effusion. Mild bibasilar atelectasis. No airspace consolidation or edema.   Electronically Signed   By: Lowella Grip III M.D.   On: 03/13/2015 09:20   Dg Chest 2 View  02/21/2015   CLINICAL DATA:  Chest pain and shortness of breath. Negative pregnancy test.  EXAM: CHEST  2 VIEW  COMPARISON:  12/05/2014  FINDINGS: Lungs are adequately inflated and demonstrate small to moderate bilateral pleural effusions right greater than left which are new. Likely associated atelectasis in the lung bases as basilar infection is also possible person cardiomediastinal silhouette and remainder of the exam is unchanged.  IMPRESSION: Small to moderate bilateral pleural effusions right greater than left. Likely associated bibasilar atelectasis versus infection.   Electronically  Signed   By: Marin Olp M.D.   On: 02/21/2015 14:54   Ct Angio Chest Pe W/cm &/or Wo Cm  03/20/2015   CLINICAL DATA:  Shortness of breath. Tachycardia. Elevated D-dimer.  EXAM: CT ANGIOGRAPHY CHEST WITH CONTRAST  TECHNIQUE: Multidetector CT imaging of the chest was performed using the  standard protocol during bolus administration of intravenous contrast. Multiplanar CT image reconstructions and MIPs were obtained to evaluate the vascular anatomy.  CONTRAST:  13mL OMNIPAQUE IOHEXOL 350 MG/ML SOLN  COMPARISON:  02/21/2015  FINDINGS: Technically adequate study with good opacification of the central and segmental pulmonary arteries. No focal filling defects. No evidence of significant pulmonary embolus.  Normal heart size. Normal caliber thoracic aorta without evidence of dissection. Great vessel origins are patent. Esophagus is decompressed. No significant lymphadenopathy in the mediastinum. Again demonstrated is lymphadenopathy in the right axilla with enlarged lymph nodes measuring up to about 15 mm short axis dimension. Large left thyroid gland nodule measuring up to 3.5 cm diameter. No change since prior study.  Moderate size bilateral pleural effusions with atelectasis or consolidation in both lung bases. This is similar to prior study. No pneumothorax.  Included portion of upper abdominal organs is unremarkable except for a small accessory spleen. Degenerative changes in the spine. No destructive bone lesions.  Review of the MIP images confirms the above findings.  IMPRESSION: No evidence of significant pulmonary embolus. Again demonstrated are moderate bilateral pleural effusions with atelectasis or consolidation in the lung bases. Axillary lymphadenopathy. Appearance is nonspecific and follow-up suggested. Left thyroid gland nodule. Follow-up ultrasound has been suggested.   Electronically Signed   By: Lucienne Capers M.D.   On: 03/20/2015 04:28   Ct Angio Chest W/cm &/or Wo Cm  02/21/2015   CLINICAL DATA:  3 day history of bilateral lower extremity edema, cough, and shortness of breath with intermittent stabbing chest pain radiating to the back.  EXAM: CT ANGIOGRAPHY CHEST WITH CONTRAST  TECHNIQUE: Multidetector CT imaging of the chest was performed using the standard protocol during  bolus administration of intravenous contrast. Multiplanar CT image reconstructions and MIPs were obtained to evaluate the vascular anatomy.  CONTRAST:  16mL OMNIPAQUE IOHEXOL 350 MG/ML IV.  COMPARISON:  No prior CT. Two-view chest x-ray earlier same date and 12/05/2014.  FINDINGS: Contrast opacification of the pulmonary arteries is good respiratory motion blurred many of the images of the lung bases. Overall, the study is of good diagnostic quality.  No filling defects within either main pulmonary artery or their branches in either lung to suggest pulmonary embolism. Heart size upper normal. Small pericardial effusion. No visible atherosclerosis involving the thoracic or upper abdominal aorta or their visualized branches. No visible coronary artery atherosclerosis.  Large bilateral pleural effusions including of fluid in the major fissure on the left. Associated dense passive atelectasis in the lower lobes and right middle lobe. No confluent airspace consolidation otherwise. No pulmonary parenchymal nodules or masses. Central airways patent with marked bronchial wall thickening.  Enlarged level 1 and level 2 right axillary lymph nodes, the largest measuring approximately 2.5 x 1.4 cm. No significant mediastinal, hilar or left axillary lymphadenopathy. Mass involving the lower pole of the left lobe of the thyroid gland measuring approximately 4.0 x 3.1 x 4.2 cm.  Visualized upper abdomen unremarkable for the early arterial phase of enhancement. Focus of accessory splenic tissue posterior to the mid spleen. Bone window images demonstrate a likely benign sclerotic bone island in the T6 vertebral body.  Review of  the MIP images confirms the above findings.  IMPRESSION: 1. No evidence of pulmonary embolism. 2. Large bilateral pleural effusions including fluid in the major fissure on the left with associated passive atelectasis in the lower lobes and right middle lobe. 3. Marked central bronchial wall thickening  consistent with severe bronchitis and/or asthma. 4. Small pericardial effusion. 5. Enlarged right axillary lymph nodes without evidence of lymphadenopathy elsewhere. Isolated unilateral axillary lymphadenopathy can be seen in breast cancer, so if not recently performed, diagnostic mammography may be helpful in further evaluation. 6. Approximate 4 cm mass involving the lower pole of the left lobe of the thyroid gland. Based on size, evaluation with ultrasound and likely biopsy is indicated. This follows ACR consensus guidelines: Managing Incidental Thyroid Nodules Detected on Imaging: White Paper of the ACR Incidental Thyroid Findings Committee. J Am Coll Radiol 2015; 12:143-150.   Electronically Signed   By: Evangeline Dakin M.D.   On: 02/21/2015 19:06   US Soft Tissue Head/neck  02/25/2015   CLINICAL DATA:  Thyroid nodule.  EXAM: THYROID ULTRASOUND  TECHNIQUE: Ultrasound examination of the thyroid gland and adjacent soft tissues was performed.  COMPARISON:  Chest CT 02/21/2015  FINDINGS: Right thyroid lobe  Measurements: 5.8 x 1.8 x 1.9 cm. There is a subtle heterogeneous nodule along the medial right thyroid lobe that measures 0.6 x 0.3 x 0.7 cm. There is a heterogeneous nodule in the inferior right thyroid lobe that measures 1.0 x 0.5 x 0.8 cm.  Left thyroid lobe  Measurements: 7.1 x 2.7 x 2.6 cm. There is a dominant nodule occupying the mid and lower aspect of the left thyroid lobe. This lesion corresponds with the nodule seen on the recent CT. This dominant left thyroid nodule measures 4.7 x 2.5 x 4.1 cm. Nodule is heterogeneous with small cystic areas. Cannot exclude small calcifications in this nodule.  Isthmus  Thickness: 0.4 cm.  No nodules visualized.  Lymphadenopathy  None visualized.  IMPRESSION: Bilateral thyroid nodules. There is a dominant left thyroid nodule that measures up to 4.7 cm. Findings meet consensus criteria for biopsy. Ultrasound-guided fine needle aspiration should be considered, as  per the consensus statement: Management of Thyroid Nodules Detected at [place]: Society of Radiologists in [place]. Radiology 2005; [id number].   Electronically Signed   By: Markus Daft M.D.   On: 02/25/2015 16:21   US Thyroid Biopsy  03/10/2015   CLINICAL DATA:  44 year old female with a history of left-sided thyroid nodule. She has been referred for percutaneous biopsy.  EXAM: ULTRASOUND GUIDED NEEDLE ASPIRATE BIOPSY OF THE THYROID GLAND  COMPARISON:  02/25/2015  PROCEDURE: The procedure, risks, benefits, and alternatives were explained to the patient. Questions regarding the procedure were encouraged and answered. The patient understands and consents to the procedure.  Ultrasound survey was performed with images stored and sent to PACs.  The left neck was prepped with Betadine in a sterile fashion, and a sterile drape was applied covering the operative field. A sterile gown and sterile gloves were used for the procedure. Local anesthesia was provided with 1% Lidocaine.  Ultrasound guidance was used to infiltrate the region with 1% lidocaine for local anesthesia. Four separate 25 gauge fine needle biopsy were then acquired of the left thyroid nodule using ultrasound guidance. Images were stored.  Slide preparation was performed.  Final image was stored after biopsy.  Patient tolerated the procedure well and remained hemodynamically stable throughout.  No complications were encountered and no significant blood loss was encounter  FINDINGS: Ultrasound survey of the left thyroid demonstrates heterogeneous nodule inferiorly.  Images during the case demonstrate needle tip within the nodule on each needle pass.  Final ultrasound image demonstrates no complicating features.  IMPRESSION: Status post ultrasound-guided biopsy of left inferior thyroid nodule. Specimen sent to pathology for complete histopathologic analysis.  Signed,  Dulcy Fanny. Earleen Newport, DO  [facility]  [facility]   Electronically Signed   By: Corrie Mckusick D.O.   On: 03/10/2015 14:44   Dg Chest Port 1 View  03/20/2015   CLINICAL DATA:  Walking Corpse syndrome  EXAM: PORTABLE CHEST - 1 VIEW  COMPARISON:  03/17/2015  FINDINGS: The cardiac enlargement with pulmonary vascular congestion and perihilar edema, increasing since prior study. Small to moderate left pleural effusion is also increasing. No pneumothorax.  IMPRESSION: Increasing pulmonary vascular congestion, edema, and left pleural effusion since prior study.   Electronically Signed   By: Lucienne Capers M.D.   On: 03/20/2015 00:11   Dg Chest Port 1 View  03/14/2015   CLINICAL DATA:  Abnormal labs, suspicious for infection.  EXAM: PORTABLE CHEST - 1 VIEW  COMPARISON:  03/12/2015  FINDINGS: There is persistent right pleural fluid or thickening in the lateral base. Mild linear basilar opacities persist and may represent scarring. Lungs are otherwise clear. Hilar, mediastinal and cardiac contours are unremarkable and unchanged. Pulmonary vasculature is normal.  IMPRESSION: Unchanged, would minimal linear basilar opacities and right lateral pleural fluid/thickening.   Electronically Signed   By: Andreas Newport M.D.   On: 03/14/2015 03:09   Dg Knee Complete 4 Views Right  03/05/2015   CLINICAL DATA:  Acute onset of right knee pain and swelling. Initial encounter.  EXAM: RIGHT KNEE - COMPLETE 4+ VIEW  COMPARISON:  None.  FINDINGS: There is no evidence of fracture or dislocation. The joint spaces are preserved. No significant degenerative change is seen; the patellofemoral joint is grossly unremarkable in appearance.  A small knee joint effusion is noted. The visualized soft tissues are otherwise unremarkable.  IMPRESSION: 1. No evidence of fracture or dislocation. 2. Small knee joint effusion noted.   Electronically Signed   By: Garald Balding M.D.   On: 03/05/2015 22:31   Dg Hand Complete Right  03/05/2015   CLINICAL DATA:   Bilateral hand pain and swelling. Initial encounter.  EXAM: RIGHT HAND - COMPLETE 3+ VIEW  COMPARISON:  None.  FINDINGS: There is no evidence of fracture or dislocation. The joint spaces are preserved. The carpal rows are intact, and demonstrate normal alignment. Mild dorsal soft tissue swelling is noted about the wrist.  IMPRESSION: No evidence of fracture or dislocation.   Electronically Signed   By: Garald Balding M.D.   On: 03/05/2015 22:30   Dg Knee Ap/lat W/sunrise Left  03/19/2015   CLINICAL DATA:  Pain and swelling.  No appreciable trauma  EXAM: LEFT KNEE 3 VIEWS  COMPARISON:  None.  FINDINGS: Frontal, lateral, and sunrise patellar images were obtained. There is no fracture or dislocation. Joint spaces appear intact. No effusion. No erosive change.  IMPRESSION: No fracture or effusion.  No appreciable arthropathy.   Electronically Signed   By: Lowella Grip III M.D.   On: 03/19/2015 13:55   Dg Knee Ap/lat W/sunrise Right  03/19/2015   CLINICAL DATA:  Pain and swelling.  No history of trauma  EXAM: RIGHT KNEE 3 VIEWS  COMPARISON:  March 05, 2015  FINDINGS: Frontal, lateral, and sunrise patellar images were obtained.  No fracture or dislocation. No appreciable joint effusion. Joint spaces appear intact. No erosive change.  IMPRESSION: No fracture or effusion.  No appreciable arthropathy.   Electronically Signed   By: Lowella Grip III M.D.   On: 03/19/2015 13:57   US Thoracentesis Asp Pleural Space W/img Guide  03/17/2015   INDICATION: Symptomatic R sided pleural effusion  EXAM: US THORACENTESIS ASP PLEURAL SPACE W/IMG GUIDE  COMPARISON:  None.  MEDICATIONS: 10 cc 1% lidocaine  COMPLICATIONS: None immediate  TECHNIQUE: Informed written consent was obtained from the patient after a discussion of the risks, benefits and alternatives to treatment. A timeout was performed prior to the initiation of the procedure.  Initial ultrasound scanning demonstrates a right pleural effusion. The lower chest was  prepped and draped in the usual sterile fashion. 1% lidocaine was used for local anesthesia.  Under direct ultrasound guidance, a 19 gauge, 7-cm, Yueh catheter was introduced. An ultrasound image was saved for documentation purposes. the thoracentesis was performed. The catheter was removed and a dressing was applied. The patient tolerated the procedure well without immediate post procedural complication. The patient was escorted to have an upright chest radiograph.  FINDINGS: A total of approximately 580 cc of yellow fluid was removed. Requested samples were sent to the laboratory.  IMPRESSION: Successful ultrasound-guided R sided thoracentesis yielding 580 cc of pleural fluid.  Read by:  Lavonia Drafts [facility]   Electronically Signed   By: Sandi Mariscal M.D.   On: 03/17/2015 15:13   US Thoracentesis Asp Pleural Space W/img Guide  03/03/2015   INDICATION: Dyspnea, bilateral pleural effusions; request is made for diagnostic and therapeutic right thoracentesis.  EXAM: ULTRASOUND GUIDED DIAGNOSTIC AND THERAPEUTIC RIGHT THORACENTESIS  COMPARISON:  None.  MEDICATIONS: None  COMPLICATIONS: None immediate  TECHNIQUE: Informed written consent was obtained from the patient via interpreter after a discussion of the risks, benefits and alternatives to treatment. A timeout was performed prior to the initiation of the procedure.  Initial ultrasound scanning demonstrates a pleural effusion. The lower chest was prepped and draped in the usual sterile fashion. 1% lidocaine was used for local anesthesia.  An ultrasound image was saved for documentation purposes. A 6 Fr Safe-T-Centesis catheter was introduced. The thoracentesis was performed. The catheter was removed and a dressing was applied. The patient tolerated the procedure well without immediate post procedural complication. The patient was escorted to have an upright chest radiograph.  FINDINGS: A total of approximately 360 cc's of slightly turbid ,yellow fluid was removed.  Requested samples were sent to the laboratory.  IMPRESSION: Successful ultrasound-guided diagnostic and therapeutic right sided thoracentesis yielding 360 cc's of pleural fluid.  Read by: Rowe Robert, PA-C   Electronically Signed   By: Markus Daft M.D.   On: 03/03/2015 16:41    CBC  Recent Labs Lab 03/15/15 1430 03/16/15 0745 03/19/15 0612 03/20/15 0509 03/21/15 0540  WBC 3.8* 4.2 4.2 4.7 3.8*  HGB 8.3* 8.3* 8.0* 8.2* 8.0*  HCT 24.9* 25.4* 24.5* 25.6* 25.1*  PLT 434* 423* 417* 395 425*  MCV 82.5 82.7 83.6 83.9 84.2  MCH 27.5 27.0 27.3 26.9 26.8  MCHC 33.3 32.7 32.7 32.0 31.9  RDW 13.2 13.4 13.8 14.0 14.0  LYMPHSABS 1.0  --   --   --   --   MONOABS 0.3  --   --   --   --   EOSABS 0.1  --   --   --   --   BASOSABS 0.0  --   --   --   --  Chemistries   Recent Labs Lab 03/16/15 0745 03/19/15 0612 03/20/15 0509 03/21/15 0540  NA 138 136 138 138  K 3.6 3.7 3.6 3.4*  CL 108 106 106 105  CO2 22 21 23 23   GLUCOSE 111* 124* 114* 95  BUN <5* <5* <5* <5*  CREATININE 0.55 0.50 0.46* 0.54  CALCIUM 8.1* 7.9* 8.4 7.9*  AST 37 24  --   --   ALT 24 14  --   --   ALKPHOS 68 63  --   --   BILITOT 0.2* 0.3  --   --    ------------------------------------------------------------------------------------------------------------------ estimated creatinine clearance is 71.7 mL/min (by C-G formula based on Cr of 0.54). ------------------------------------------------------------------------------------------------------------------ No results for input(s): HGBA1C in the last 72 hours. ------------------------------------------------------------------------------------------------------------------ No results for input(s): CHOL, HDL, LDLCALC, TRIG, CHOLHDL, LDLDIRECT in the last 72 hours. ------------------------------------------------------------------------------------------------------------------ No results for input(s): TSH, T4TOTAL, T3FREE, THYROIDAB in the last 72  hours.  Invalid input(s): FREET3 ------------------------------------------------------------------------------------------------------------------  Recent Labs  03/21/15 0540  VITAMINB12 239  FOLATE 9.4  FERRITIN 190  TIBC 137*  IRON 13*  RETICCTPCT 2.1    Coagulation profile No results for input(s): INR, PROTIME in the last 168 hours.   Recent Labs  03/20/15 0009  DDIMER 7.80*    Cardiac Enzymes No results for input(s): CKMB, TROPONINI, MYOGLOBIN in the last 168 hours.  Invalid input(s): CK ------------------------------------------------------------------------------------------------------------------ Invalid input(s): POCBNP    Kandis Henry D.O. on 03/22/2015 at 11:20 AM  Between 7am to 7pm - Pager - [phone]  After 7pm go to [email or web address] - password [email or web address]  And look for the night coverage person covering for me after hours  [facility] Office  [phone]

## 2015-03-22 NOTE — Progress Notes (Signed)
Pt remains febrile. Temp at 0100 was 102.3 oral. Fredirick Maudlin, NP notified. Order received to give tylenol q 4 hour. Pt resting well. No dyspnea with O2 at 2 liters. Sats 99%. States her hands are sore and she is unable to make a fist with either hands. Fingers remain swollen.

## 2015-03-23 ENCOUNTER — Inpatient Hospital Stay (HOSPITAL_COMMUNITY): Payer: 59

## 2015-03-23 ENCOUNTER — Ambulatory Visit: Payer: 59 | Admitting: Infectious Diseases

## 2015-03-23 DIAGNOSIS — N912 Amenorrhea, unspecified: Secondary | ICD-10-CM

## 2015-03-23 DIAGNOSIS — A1889 Tuberculosis of other sites: Secondary | ICD-10-CM | POA: Insufficient documentation

## 2015-03-23 DIAGNOSIS — M2540 Effusion, unspecified joint: Secondary | ICD-10-CM

## 2015-03-23 DIAGNOSIS — B9689 Other specified bacterial agents as the cause of diseases classified elsewhere: Secondary | ICD-10-CM

## 2015-03-23 LAB — COMPREHENSIVE METABOLIC PANEL
ALBUMIN: 2.2 g/dL — AB (ref 3.5–5.2)
ALT: 7 U/L (ref 0–35)
ANION GAP: 13 (ref 5–15)
AST: 23 U/L (ref 0–37)
Alkaline Phosphatase: 68 U/L (ref 39–117)
BILIRUBIN TOTAL: 0.7 mg/dL (ref 0.3–1.2)
CO2: 20 mmol/L (ref 19–32)
CREATININE: 0.62 mg/dL (ref 0.50–1.10)
Calcium: 8.8 mg/dL (ref 8.4–10.5)
Chloride: 105 mmol/L (ref 96–112)
GFR calc Af Amer: >90 mL/min (ref >90)
GFR calc non Af Amer: >90 mL/min (ref >90)
Glucose, Bld: 168 mg/dL — ABNORMAL HIGH (ref 70–99)
Potassium: 4.1 mmol/L (ref 3.5–5.1)
Sodium: 138 mmol/L (ref 135–145)
TOTAL PROTEIN: 6.4 g/dL (ref 6.0–8.3)

## 2015-03-23 LAB — CBC WITH DIFFERENTIAL/PLATELET
Basophils Absolute: 0.1 10*3/uL (ref 0.0–0.1)
Basophils Relative: 1 % (ref 0–1)
Eosinophils Absolute: 0.4 10*3/uL (ref 0.0–0.7)
Eosinophils Relative: 7 % — ABNORMAL HIGH (ref 0–5)
HEMATOCRIT: 29.7 % — AB (ref 36.0–46.0)
HEMOGLOBIN: 9.5 g/dL — AB (ref 12.0–15.0)
Lymphocytes Relative: 25 % (ref 12–46)
Lymphs Abs: 1.6 10*3/uL (ref 0.7–4.0)
MCH: 27 pg (ref 26.0–34.0)
MCHC: 32 g/dL (ref 30.0–36.0)
MCV: 84.4 fL (ref 78.0–100.0)
Monocytes Absolute: 0.7 10*3/uL (ref 0.1–1.0)
Monocytes Relative: 11 % (ref 3–12)
Neutro Abs: 3.4 10*3/uL (ref 1.7–7.7)
Neutrophils Relative %: 56 % (ref 43–77)
Platelets: 458 10*3/uL — ABNORMAL HIGH (ref 150–400)
RBC: 3.52 MIL/uL — ABNORMAL LOW (ref 3.87–5.11)
RDW: 14.3 % (ref 11.5–15.5)
WBC: 6.2 10*3/uL (ref 4.0–10.5)

## 2015-03-23 MED ORDER — FUROSEMIDE 20 MG PO TABS
20.0000 mg | ORAL_TABLET | Freq: Every day | ORAL | Status: DC
  Administered 2015-03-23 – 2015-03-26 (×3): 20 mg via ORAL
  Filled 2015-03-23 (×3): qty 1

## 2015-03-23 NOTE — Progress Notes (Signed)
[facility]    Subjective: Suffering from orthopnea  Antibiotics:  Anti-infectives    Start     Dose/Rate Route Frequency Ordered Stop   03/21/15 1300  pyrazinamide tablet 1,000 mg     1,000 mg Oral Daily 03/21/15 1200     03/21/15 1230  ethambutol (MYAMBUTOL) tablet 850 mg     15 mg/kg  57.2 kg Oral Daily 03/21/15 1200     03/21/15 1230  rifampin (RIFADIN) capsule 600 mg     600 mg Oral Daily 03/21/15 1200     03/21/15 1230  isoniazid (NYDRAZID) tablet 300 mg     300 mg Oral Daily 03/21/15 1200     03/14/15 1445  ceFAZolin (ANCEF) IVPB 2 g/50 mL premix     2 g 100 mL/hr over 30 Minutes Intravenous 3 times per day 03/14/15 1426     03/14/15 1400  piperacillin-tazobactam (ZOSYN) IVPB 3.375 g  Status:  Discontinued     3.375 g 12.5 mL/hr over 240 Minutes Intravenous 3 times per day 03/14/15 1319 03/14/15 1426   03/14/15 0830  piperacillin-tazobactam (ZOSYN) IVPB 3.375 g  Status:  Discontinued     3.375 g 12.5 mL/hr over 240 Minutes Intravenous Every 8 hours 03/14/15 0217 03/14/15 0651   03/14/15 0230  vancomycin (VANCOCIN) IVPB 750 mg/150 ml premix  Status:  Discontinued     750 mg 150 mL/hr over 60 Minutes Intravenous Every 12 hours 03/14/15 0217 03/16/15 1151   03/14/15 0215  piperacillin-tazobactam (ZOSYN) IVPB 3.375 g     3.375 g 100 mL/hr over 30 Minutes Intravenous  Once 03/14/15 0213 03/14/15 0323      Medications: Scheduled Meds: . antiseptic oral rinse  7 mL Mouth Rinse BID  .  ceFAZolin (ANCEF) IV  2 g Intravenous 3 times per day  . cyanocobalamin  1,000 mcg Intramuscular Once  . ethambutol  15 mg/kg Oral Daily  . feeding supplement (ENSURE)  1 Container Oral TID BM  . furosemide  20 mg Oral Daily  . isoniazid  300 mg Oral Daily  . megestrol  40 mg Oral Daily  . multivitamin with minerals  1 tablet Oral Daily  . pyrazinamide  1,000 mg Oral Daily  . vitamin B-6  50 mg Oral Daily  . rifampin  600 mg Oral Daily  . senna-docusate   1 tablet Oral QHS   Continuous Infusions: . lactated ringers 10 mL/hr at 03/20/15 1629   PRN Meds:.ibuprofen, oxyCODONE    Objective: Weight change: -3 lb (-1.361 kg)  Intake/Output Summary (Last 24 hours) at 03/23/15 1704 Last data filed at 03/23/15 1300  Gross per 24 hour  Intake    660 ml  Output      0 ml  Net    660 ml   Blood pressure 101/58, pulse 117, temperature 100.3 F (37.9 C), temperature source Oral, resp. rate 18, height 5\' 2"  (1.575 m), weight 123 lb (55.792 kg), last menstrual period 11/19/2014, SpO2 94 %. Temp:  [98.4 F (36.9 C)-100.3 F (37.9 C)] 100.3 F (37.9 C) (04/11 1357) Pulse Rate:  [98-120] 117 (04/11 1357) Resp:  [18-20] 18 (04/11 1357) BP: (101-125)/(58-78) 101/58 mmHg (04/11 1357) SpO2:  [84 %-100 %] 94 % (04/11 1357) Weight:  [123 lb (55.792 kg)] 123 lb (55.792 kg) (04/11 0500)  Physical Exam: General: Alert and awake, oriented x3, feeling out of breath HEENT: anicteric sclera, pupils reactive to light and accommodation, EOMI CVS regular rate, normal r,  no  murmur rubs or gallops Chest: diminished breath sounds at the bases bilaterally  Abdomen: soft nontender, nondistended, normal bowel sounds, Extremities: Both knees and both ankles are tender to palpation R>> left and ankles have some marked edema Skin: no rashes Lymph: Right axillary lymph node surgical site is healing well Neuro: nonfocal  CBC: CBC Latest Ref Rng 03/23/2015 03/21/2015 03/20/2015  WBC 4.0 - 10.5 K/uL 6.2 3.8(L) 4.7  Hemoglobin 12.0 - 15.0 g/dL 9.5(L) 8.0(L) 8.2(L)  Hematocrit 36.0 - 46.0 % 29.7(L) 25.1(L) 25.6(L)  Platelets 150 - 400 K/uL 458(H) 425(H) 395       BMET  Recent Labs  03/21/15 0540 03/23/15 0839  NA 138 138  K 3.4* 4.1  CL 105 105  CO2 23 20  GLUCOSE 95 168*  BUN <5* <5*  CREATININE 0.54 0.62  CALCIUM 7.9* 8.8     Liver Panel   Recent Labs  03/23/15 0839  PROT 6.4  ALBUMIN 2.2*  AST 23  ALT 7  ALKPHOS 68  BILITOT 0.7        Sedimentation Rate No results for input(s): ESRSEDRATE in the last 72 hours. C-Reactive Protein No results for input(s): CRP in the last 72 hours.  Micro Results: Recent Results (from the past 720 hour(s))  Body fluid culture     Status: None   Collection Time: 03/03/15  4:41 PM  Result Value Ref Range Status   Specimen Description PLEURAL  Final   Special Requests PLEURAL  Final   Gram Stain   Final    RARE WBC PRESENT, PREDOMINANTLY PMN NO ORGANISMS SEEN Performed at [facility]    Culture   Final    NO GROWTH 3 DAYS Performed at [facility]    Report Status 03/07/2015 FINAL  Final  AFB culture with smear     Status: None (Preliminary result)   Collection Time: 03/03/15  4:41 PM  Result Value Ref Range Status   Specimen Description PLEURAL  Final   Special Requests NONE  Final   Acid Fast Smear   Final    NO ACID FAST BACILLI SEEN Performed at [facility]    Culture   Final    CULTURE WILL BE EXAMINED FOR 6 WEEKS BEFORE ISSUING A FINAL REPORT Performed at [facility]    Report Status PENDING  Incomplete  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 tuberculosis, Direct CANCELED      Comment: Specimen already disgarded from referring [facility]. Will need to recollect patient.  Result canceled by the ancillary    Source (MTBPCR) CANCELED      Comment: Result canceled by the ancillary  Culture, blood (single)     Status: None   Collection Time: 03/12/15  1:25 PM  Result Value Ref Range Status   Culture STAPHYLOCOCCUS SPECIES (COAGULASE NEGATIVE)  Final   Organism ID, Bacteria STAPHYLOCOCCUS SPECIES (COAGULASE NEGATIVE)  Final    Comment: Rifampin and Gentamicin should not be used as single drugs for treatment of Staph infections. This organism DOES NOT demonstrate inducible Clindamycin resistance in vitro. Gram Stain Report Called to,Read  Back By and Verified With: DR. Reginia Forts 03/13/15 AT 2300 RIDK       Susceptibility   Staphylococcus species (coagulase negative) -  (no method available)    PENICILLIN 0.25 Resistant     OXACILLIN <=0.25 Sensitive  CEFAZOLIN  Sensitive     GENTAMICIN <=0.5 Sensitive     CIPROFLOXACIN <=0.5 Sensitive     LEVOFLOXACIN <=0.12 Sensitive     TRIMETH/SULFA <=10 Sensitive     VANCOMYCIN 1 Sensitive     CLINDAMYCIN <=0.25 Sensitive     ERYTHROMYCIN >=8 Resistant     RIFAMPIN <=0.5 Sensitive     TETRACYCLINE <=1 Sensitive   Culture, blood (single)     Status: None   Collection Time: 03/12/15  1:25 PM  Result Value Ref Range Status   Organism ID, Bacteria STAPHYLOCOCCUS SPECIES (COAGULASE NEGATIVE)  Final    Comment: Susceptibilities performed on previous culture within the last 5 days. Gram Stain Report Called to,Read Back By and Verified With: DR. Reginia Forts 03/13/15 AT 2230 [place]   Blood Culture (routine x 2)     Status: None   Collection Time: 03/14/15  2:39 AM  Result Value Ref Range Status   Specimen Description BLOOD RIGHT WRIST  Final   Special Requests BOTTLES DRAWN AEROBIC ONLY 4CC  Final   Culture   Final    NO GROWTH 5 DAYS Performed at [facility]    Report Status 03/20/2015 FINAL  Final  Blood Culture (routine x 2)     Status: None   Collection Time: 03/14/15  2:46 AM  Result Value Ref Range Status   Specimen Description BLOOD LEFT ARM  Final   Special Requests BOTTLES DRAWN AEROBIC AND ANAEROBIC 5CC  Final   Culture   Final    NO GROWTH 5 DAYS Performed at [facility]    Report Status 03/20/2015 FINAL  Final  Urine culture     Status: None   Collection Time: 03/14/15  8:43 AM  Result Value Ref Range Status   Specimen Description URINE, RANDOM  Final   Special Requests NONE  Final   Colony Count   Final    1,000 COLONIES/ML Performed at [facility]    Culture   Final    INSIGNIFICANT GROWTH Performed at [facility]    Report Status 03/16/2015 FINAL  Final  Culture, blood (routine x 2)     Status: None   Collection Time: 03/15/15  9:40 AM  Result Value Ref Range Status   Specimen Description BLOOD LEFT ARM  Final   Special Requests BOTTLES DRAWN AEROBIC ONLY 10 CC  Final   Culture   Final    NO GROWTH 5 DAYS Note: Culture results may be compromised due to an excessive volume of blood received in culture bottles. Performed at [facility]    Report Status 03/21/2015 FINAL  Final  Culture, blood (routine x 2)     Status: None   Collection Time: 03/15/15  9:45 AM  Result Value Ref Range Status   Specimen Description BLOOD LEFT WRIST  Final   Special Requests BOTTLES DRAWN AEROBIC ONLY 10 CC  Final   Culture   Final    NO GROWTH 5 DAYS Note: Culture results may be compromised due to an excessive volume of blood received in culture bottles. Performed at [facility]    Report Status 03/21/2015 FINAL  Final  Body fluid culture     Status: None   Collection Time: 03/17/15  2:27 PM  Result Value Ref Range Status   Specimen Description FLUID RIGHT PLEURAL  Final   Special Requests NONE  Final   Gram Stain   Final    RARE WBC PRESENT,BOTH PMN AND  MONONUCLEAR NO ORGANISMS SEEN Performed at [facility]    Culture   Final    NO GROWTH 3 DAYS Performed at [facility]    Report Status 03/21/2015 FINAL  Final  Fungus Culture with Smear     Status: None (Preliminary result)   Collection Time: 03/19/15 12:18 PM  Result Value Ref Range Status   Specimen Description FLUID SYNOVIAL RIGHT KNEE  Final   Special Requests Normal  Final   Fungal Smear   Final    NO YEAST OR FUNGAL ELEMENTS SEEN Performed at [facility]    Culture   Final    CULTURE IN PROGRESS FOR FOUR WEEKS Performed at [facility]    Report Status PENDING  Incomplete  Body fluid culture     Status: None   Collection Time: 03/19/15 12:19 PM  Result Value Ref Range Status    Specimen Description FLUID SYNOVIAL RIGHT KNEE  Final   Special Requests Normal  Final   Gram Stain   Final    MODERATE WBC PRESENT, PREDOMINANTLY PMN NO ORGANISMS SEEN Performed at [facility]    Culture   Final    NO GROWTH 3 DAYS Performed at [facility]    Report Status 03/22/2015 FINAL  Final  AFB culture with smear     Status: None (Preliminary result)   Collection Time: 03/19/15 12:19 PM  Result Value Ref Range Status   Specimen Description FLUID SYNOVIAL RIGHT KNEE  Final   Special Requests Normal  Final   Acid Fast Smear   Final    NO ACID FAST BACILLI SEEN Performed at [facility]    Culture   Final    CULTURE WILL BE EXAMINED FOR 6 WEEKS BEFORE ISSUING A FINAL REPORT Performed at [facility]    Report Status PENDING  Incomplete  Culture, blood (routine x 2)     Status: None (Preliminary result)   Collection Time: 03/20/15  5:00 AM  Result Value Ref Range Status   Specimen Description BLOOD LEFT ARM  Final   Special Requests BOTTLES DRAWN AEROBIC AND ANAEROBIC 5ML  Final   Culture   Final           BLOOD CULTURE RECEIVED NO GROWTH TO DATE CULTURE WILL BE HELD FOR 5 DAYS BEFORE ISSUING A FINAL NEGATIVE REPORT Performed at [facility]    Report Status PENDING  Incomplete  Culture, blood (routine x 2)     Status: None (Preliminary result)   Collection Time: 03/20/15  5:10 AM  Result Value Ref Range Status   Specimen Description BLOOD RIGHT ARM  Final   Special Requests BOTTLES DRAWN AEROBIC AND ANAEROBIC 5ML  Final   Culture   Final           BLOOD CULTURE RECEIVED NO GROWTH TO DATE CULTURE WILL BE HELD FOR 5 DAYS BEFORE ISSUING A FINAL NEGATIVE REPORT Performed at [facility]    Report Status PENDING  Incomplete  Tissue culture     Status: None (Preliminary result)   Collection Time: 03/20/15  5:42 PM  Result Value Ref Range Status   Specimen Description TISSUE LYMPH NODE  Final   Special Requests NONE   Final   Gram Stain   Final    FEW WBC PRESENT, PREDOMINANTLY MONONUCLEAR NO ORGANISMS SEEN Performed at [facility]    Culture   Final    NO GROWTH 2 DAYS Performed at [facility]    Report Status PENDING  Incomplete  Fungus Culture with Smear     Status: None (Preliminary result)   Collection Time: 03/20/15  5:42 PM  Result Value Ref Range Status   Specimen Description TISSUE LYMPH NODE  Final   Special Requests NONE  Final   Fungal Smear   Final    NO YEAST OR FUNGAL ELEMENTS SEEN Performed at [facility]    Culture   Final    CULTURE IN PROGRESS FOR FOUR WEEKS Performed at [facility]    Report Status PENDING  Incomplete  AFB culture with smear     Status: None (Preliminary result)   Collection Time: 03/20/15  5:42 PM  Result Value Ref Range Status   Specimen Description TISSUE LYMPH NODE  Final   Special Requests NONE  Final   Acid Fast Smear   Final    NO ACID FAST BACILLI SEEN Performed at [facility]    Culture   Final    CULTURE WILL BE EXAMINED FOR 6 WEEKS BEFORE ISSUING A FINAL REPORT Performed at [facility]    Report Status PENDING  Incomplete    Studies/Results: No results found.    Assessment/Plan:  Principal Problem:   Coagulase negative Staphylococcus bacteremia Active Problems:   Goiter   Bilateral pleural effusion   Arthralgia   Amenorrhea   LAD (lymphadenopathy), axillary   Bilateral edema of lower extremity   Fever   Sepsis   Transaminasemia   Latent tuberculosis by blood test   Bacteremia   Knee effusion   Pleural effusion   Tuberculosis   Elevated d-dimer   SOB (shortness of breath)    Nichole Perry is a 44 y.o. female from [place] who began having fevers, chills and sweats a little over 3 weeks ago. She is also noted migratory joint swelling and pain.She  first began in both feet and both knees. The swelling and pain in her feet has improved but now she has swelling in  her left wrist. She is also had shortness of breath and was found to have bilateral pleural effusions on chest x-ray and CT scan. There was some atelectasis in the bases but no underlying infiltrates were noted. She underwent thoracentesis revealing exudative effusions. Gram stain and AFB stain were negative. Routine cultures were negative and AFB cultures are negative to date.Her Quantiferon TB assay was also positive. Pleural fluid cytology showed reactive mesothelial cells. She also underwent a biopsy of her thyroid gland that was benign as well on the 22nd.   She was then seen by her primary care physician in follow-up and found to still be suffering from malaise and to be febrile. Blood cultures were obtained on03/31/2016 in both are growing Methicillin Sensitive regulations negative staphylococcal species.  She continued to have pain in both knees as well as her ankles. She has a dry nonproductive cough.  She has several nonspecific inflammatory markers are elevated including a sedimentation rate above 100. 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  If we move forward to this then the health department is to be contacted to they can arrange for her to  receive therapy for extrapulmonary tuberculosis.  #3 exudative pleural effusions see prior discussions not clear to me that the adenosine deaminase or MTB PCR's were performed but cultures have been taken  She seems more uncomfortable today and what if she needs another thoracentesis Primary team going to diurese her in the interim  #4  Inguinal LA: agree that excisional lymph node biopsy and pathology is pending    #5 Knee effusions, ankle effusions: could consider aspirate of right knee for cell count differential, crystals, culture dont seem to be acting up as much today      LOS: 9 days   Alcide Evener 03/23/2015, 5:04 PM

## 2015-03-23 NOTE — Progress Notes (Signed)
[facility]                                                                              Patient Demographics  Nichole Perry, is a 44 y.o. female, DOB - 07/12/1971, TIW:[id number]  Admit date - 03/14/2015   Admitting Physician Lavina Hamman, MD  Outpatient Primary MD for the patient is Maximino Greenland, MD  LOS - 9   Chief Complaint  Patient presents with  . Abnormal Lab      Brief history 44 year old female with a history of goiter and anemia presented to the hospital when she was called by her primary care provider with positive blood cultures. The patient has not been feeling well for nearly 3 weeks. The patient initially presented to her primary care provider because of bilateral knee pain and leg swelling on 02/21/2015. She was started on furosemide 20 mg daily. Chest x-ray at that time revealed small to moderate bilateral pleural effusions. CT angio of the chest was performed on 02/21/2015 which was negative for pulmonary embolus but revealed bilateral pleural effusions and central bronchial wall thickening and enlarged right axillary lymph node. She underwent thoracocentesis on 03/03/2015. Fluid chemistries revealed an exudative pleural effusion with WBC 3000 (64% monos, 28% PMN, 8% eosinophils). Routine Fluid culture and AFB culture are negative at this time. 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 The patient followed up on 03/07/2015, and an autoimmune workup was begun which was negative. Again, the patient represented to the primary care provider office on 03/12/2015 with 2 day history of fever up to 101.5F. Blood cultures were obtained at that time are growing CoNS. Notably, the patient also was noted to have a right thyroid nodule which has been biopsied on 03/10/2015. The biopsy revealed benign follicular nodule. Since admission, the patient has continued to have fevers. Workup continues in progress.  Assessment & Plan   Sepsis -Improving. Fever 100F over the past  24hours -Secondary to bacteremia ? Vs TB -Lactic acid 2.22 on 03/14/2015 -Continue IV cefazolin -flu is neg -Given TB meds, will not use tylenol for fever. Motrin ordered.   Dyspnea  -Resolved, patient became tachypneic with tachycardia overnight on 4/7-8 -D-dimer was noted to be elevated, CT chest was conducted showing no pulmonary embolism however moderate bilateral pleural effusions. -Patient was given a dose of Lasix, dyspnea has improved -Currently remains on 2 L oxygen via nasal cannula -IVF DISCONTINUED (not sure why IVF were restarted overnight)  Bacteremia-- Coag negative Staph  -unclear presently if true bacteremia vs contaminant as 4/2 repeat blood cultures remain negative to date (had not yet started antibiotics) -may be partly contributing to clinical picture, but doubt is the entire answer -ID following -Echocardiogram: EF 60%, no gross vegetation -follow surveillance blood cultures--neg to date -TEE cannot be done at this time as patient is on airborne precautions -Continue cefazolin day 9 (for 14 days total)  QuantiFeron Positive/Exudative pleural effusion -03/17/15 repeat thoracocentesis WBC 797 (75% lymphs), glucose 150-- MTB PCR, ADA, glucose, cytology/ culture show no growth -given pt's clinical and epidemiologic hx, will still need to r/o pulmonary TB in setting of lymphocytic effusion -airborne isolation--await sputum although pt is barely coughing and unable to induce sputum -Spoke with  Pulm regarding possible VATS (pleural bx)- however no longer available; Spoke with [facility], no longer done there either, may need CT surg -General surgery consulted and appreciated, s/p Right axillary lymph node biopsy -Tissue sample: no AFB, no yeast or fungal elements -Spoke with ID (quantiferon + in March 2016)- TB treatment started 4/9- Rifampin, Isoniazid, Pyridoxine, ethambutol with Vitamin B6 -Will speak to ID regarding length of  antibiotics  Transaminasemia -Resolved, secondary to sepsis -No abdominal pain, no vomiting -Hepatitis B surface antigen, hepatitis C antibody--negative -HIV antibody is negative  Bilateral pleural effusion -03/03/2015 thoracocentesis--exudative -WBC 3000 (64% monos, 28% PMN, 8% eosinophils)  Arthralgias and myalgias/ Lower extremity and hand edema and pain -Autoimmune workup has been negative including C3, C4, anti-CCP, ANA, RF, ENA, anti-dsDNA, Anti-SmAb -Lower ext doppler negative for DVT -improving with standard antibiotics -Continues to have swelling in upper ext and knees- improved -Consulted orthopedics- conducted aspiration of right knee at bedside -Right knee fluid culture: no organisms seen; no yeast or fungal elements -knee pain seems have to have improved, but complains of pain andw welling in hands -PT consulted  Normocytic Anemia -Likely chronic. Hb upon admission 10, improved, hb 9.5 -Drop in hb likely dilutional as patient has been on IVF -Anemia panel: Iron 13, Ferritin 190, Vit B12 239 -Patient given dose of ferahem and B12  Hypoalbuminemia/malnutrition -Nutrition consulted -Continue megace and nutritional supplements -Given dose of albumin, increased from 1.6 to 2.2 -Encouraged patient to eat  Constipation  -Will add on bowel regimen  Code Status: Full  Family Communication: Husband at bedside  Disposition Plan: Admitted.  Will continue to monitor as patient continues to spike fevers- however, improving.     On 03/19/2015, Spoke with [facility] and [facility]- had no recommendations and did not feel patient needed to transfer.   Time Spent in minutes   30 minutes  Procedures  Echocardiogram US Thoracentesis Right knee aspiration Right axillary lymph node biopsy  Consults   Pulmonology Interventional radiology Infectious disease Orthopedic surgery General surgery  DVT Prophylaxis  SCDs  Lab Results  Component Value Date   PLT 458* 03/23/2015     Medications  Scheduled Meds: . antiseptic oral rinse  7 mL Mouth Rinse BID  .  ceFAZolin (ANCEF) IV  2 g Intravenous 3 times per day  . cyanocobalamin  1,000 mcg Intramuscular Once  . ethambutol  15 mg/kg Oral Daily  . feeding supplement (ENSURE)  1 Container Oral TID BM  . isoniazid  300 mg Oral Daily  . megestrol  40 mg Oral Daily  . multivitamin with minerals  1 tablet Oral Daily  . pyrazinamide  1,000 mg Oral Daily  . vitamin B-6  50 mg Oral Daily  . rifampin  600 mg Oral Daily  . senna-docusate  1 tablet Oral QHS   Continuous Infusions: . lactated ringers 10 mL/hr at 03/20/15 1629   PRN Meds:.ibuprofen, oxyCODONE  Antibiotics    Anti-infectives    Start     Dose/Rate Route Frequency Ordered Stop   03/21/15 1300  pyrazinamide tablet 1,000 mg     1,000 mg Oral Daily 03/21/15 1200     03/21/15 1230  ethambutol (MYAMBUTOL) tablet 850 mg     15 mg/kg  57.2 kg Oral Daily 03/21/15 1200     03/21/15 1230  rifampin (RIFADIN) capsule 600 mg     600 mg Oral Daily 03/21/15 1200     03/21/15 1230  isoniazid (NYDRAZID) tablet 300 mg     300  mg Oral Daily 03/21/15 1200     03/14/15 1445  ceFAZolin (ANCEF) IVPB 2 g/50 mL premix     2 g 100 mL/hr over 30 Minutes Intravenous 3 times per day 03/14/15 1426     03/14/15 1400  piperacillin-tazobactam (ZOSYN) IVPB 3.375 g  Status:  Discontinued     3.375 g 12.5 mL/hr over 240 Minutes Intravenous 3 times per day 03/14/15 1319 03/14/15 1426   03/14/15 0830  piperacillin-tazobactam (ZOSYN) IVPB 3.375 g  Status:  Discontinued     3.375 g 12.5 mL/hr over 240 Minutes Intravenous Every 8 hours 03/14/15 0217 03/14/15 0651   03/14/15 0230  vancomycin (VANCOCIN) IVPB 750 mg/150 ml premix  Status:  Discontinued     750 mg 150 mL/hr over 60 Minutes Intravenous Every 12 hours 03/14/15 0217 03/16/15 1151   03/14/15 0215  piperacillin-tazobactam (ZOSYN) IVPB 3.375 g     3.375 g 100 mL/hr over 30 Minutes Intravenous  Once 03/14/15 0213 03/14/15  0323        Subjective:   [name] seen and examined today.  Patient feels better today but continues to complain of the inability of making a fist. States it has improved since yesterday. Denies any further knee pain. Denies chest pain or shortness of breath, abdominal pain. Does complain of some constipation. Patient states she's not had a bowel movement however does not go on a regular basis.   Objective:   Filed Vitals:   03/23/15 0437 03/23/15 0500 03/23/15 0628 03/23/15 0834  BP:   103/58   Pulse: 98  102 102  Temp:   99.1 F (37.3 C) 98.4 F (36.9 C)  TempSrc:   Oral   Resp: 18  20 20   Height:      Weight:  55.792 kg (123 lb)    SpO2: 95%  96% 100%    Wt Readings from Last 3 Encounters:  03/23/15 55.792 kg (123 lb)  03/12/15 52.527 kg (115 lb 12.8 oz)  03/05/15 53.524 kg (118 lb)     Intake/Output Summary (Last 24 hours) at 03/23/15 1202 Last data filed at 03/23/15 0230  Gross per 24 hour  Intake    627 ml  Output      0 ml  Net    627 ml    Exam  General: Well developed, NAD  Cardiovascular: S1 S2 auscultated,RRR, no murmurs  Respiratory: Clear but slightly diminished  Abdomen: Soft, nontender, nondistended, + bowel sounds  Extremities: trace B/L ankle/foot and hand edema, improving   Neuro: AAOx3, nonfocal  Psych: Pleasant   Data Review   Micro Results Recent Results (from the past 240 hour(s))  Blood Culture (routine x 2)     Status: None   Collection Time: 03/14/15  2:39 AM  Result Value Ref Range Status   Specimen Description BLOOD RIGHT WRIST  Final   Special Requests BOTTLES DRAWN AEROBIC ONLY 4CC  Final   Culture   Final    NO GROWTH 5 DAYS Performed at [facility]    Report Status 03/20/2015 FINAL  Final  Blood Culture (routine x 2)     Status: None   Collection Time: 03/14/15  2:46 AM  Result Value Ref Range Status   Specimen Description BLOOD LEFT ARM  Final   Special Requests BOTTLES DRAWN AEROBIC AND  ANAEROBIC 5CC  Final   Culture   Final    NO GROWTH 5 DAYS Performed at [facility]    Report Status 03/20/2015 FINAL  Final  Urine culture     Status: None   Collection Time: 03/14/15  8:43 AM  Result Value Ref Range Status   Specimen Description URINE, RANDOM  Final   Special Requests NONE  Final   Colony Count   Final    1,000 COLONIES/ML Performed at [facility]    Culture   Final    INSIGNIFICANT GROWTH Performed at [facility]    Report Status 03/16/2015 FINAL  Final  Culture, blood (routine x 2)     Status: None   Collection Time: 03/15/15  9:40 AM  Result Value Ref Range Status   Specimen Description BLOOD LEFT ARM  Final   Special Requests BOTTLES DRAWN AEROBIC ONLY 10 CC  Final   Culture   Final    NO GROWTH 5 DAYS Note: Culture results may be compromised due to an excessive volume of blood received in culture bottles. Performed at [facility]    Report Status 03/21/2015 FINAL  Final  Culture, blood (routine x 2)     Status: None   Collection Time: 03/15/15  9:45 AM  Result Value Ref Range Status   Specimen Description BLOOD LEFT WRIST  Final   Special Requests BOTTLES DRAWN AEROBIC ONLY 10 CC  Final   Culture   Final    NO GROWTH 5 DAYS Note: Culture results may be compromised due to an excessive volume of blood received in culture bottles. Performed at [facility]    Report Status 03/21/2015 FINAL  Final  Body fluid culture     Status: None   Collection Time: 03/17/15  2:27 PM  Result Value Ref Range Status   Specimen Description FLUID RIGHT PLEURAL  Final   Special Requests NONE  Final   Gram Stain   Final    RARE WBC PRESENT,BOTH PMN AND MONONUCLEAR NO ORGANISMS SEEN Performed at [facility]    Culture   Final    NO GROWTH 3 DAYS Performed at [facility]    Report Status 03/21/2015 FINAL  Final  Fungus Culture with Smear     Status: None (Preliminary result)   Collection Time:  03/19/15 12:18 PM  Result Value Ref Range Status   Specimen Description FLUID SYNOVIAL RIGHT KNEE  Final   Special Requests Normal  Final   Fungal Smear   Final    NO YEAST OR FUNGAL ELEMENTS SEEN Performed at [facility]    Culture   Final    CULTURE IN PROGRESS FOR FOUR WEEKS Performed at [facility]    Report Status PENDING  Incomplete  Body fluid culture     Status: None   Collection Time: 03/19/15 12:19 PM  Result Value Ref Range Status   Specimen Description FLUID SYNOVIAL RIGHT KNEE  Final   Special Requests Normal  Final   Gram Stain   Final    MODERATE WBC PRESENT, PREDOMINANTLY PMN NO ORGANISMS SEEN Performed at [facility]    Culture   Final    NO GROWTH 3 DAYS Performed at [facility]    Report Status 03/22/2015 FINAL  Final  AFB culture with smear     Status: None (Preliminary result)   Collection Time: 03/19/15 12:19 PM  Result Value Ref Range Status   Specimen Description FLUID SYNOVIAL RIGHT KNEE  Final   Special Requests Normal  Final   Acid Fast Smear   Final    NO ACID FAST BACILLI SEEN  Performed at [facility]   Final    CULTURE WILL BE EXAMINED FOR 6 WEEKS BEFORE ISSUING A FINAL REPORT Performed at [facility]    Report Status PENDING  Incomplete  Culture, blood (routine x 2)     Status: None (Preliminary result)   Collection Time: 03/20/15  5:00 AM  Result Value Ref Range Status   Specimen Description BLOOD LEFT ARM  Final   Special Requests BOTTLES DRAWN AEROBIC AND ANAEROBIC 5ML  Final   Culture   Final           BLOOD CULTURE RECEIVED NO GROWTH TO DATE CULTURE WILL BE HELD FOR 5 DAYS BEFORE ISSUING A FINAL NEGATIVE REPORT Performed at [facility]    Report Status PENDING  Incomplete  Culture, blood (routine x 2)     Status: None (Preliminary result)   Collection Time: 03/20/15  5:10 AM  Result Value Ref Range Status   Specimen Description BLOOD RIGHT ARM  Final    Special Requests BOTTLES DRAWN AEROBIC AND ANAEROBIC 5ML  Final   Culture   Final           BLOOD CULTURE RECEIVED NO GROWTH TO DATE CULTURE WILL BE HELD FOR 5 DAYS BEFORE ISSUING A FINAL NEGATIVE REPORT Performed at [facility]    Report Status PENDING  Incomplete  Tissue culture     Status: None (Preliminary result)   Collection Time: 03/20/15  5:42 PM  Result Value Ref Range Status   Specimen Description TISSUE LYMPH NODE  Final   Special Requests NONE  Final   Gram Stain   Final    FEW WBC PRESENT, PREDOMINANTLY MONONUCLEAR NO ORGANISMS SEEN Performed at [facility]    Culture   Final    NO GROWTH 2 DAYS Performed at [facility]    Report Status PENDING  Incomplete  Fungus Culture with Smear     Status: None (Preliminary result)   Collection Time: 03/20/15  5:42 PM  Result Value Ref Range Status   Specimen Description TISSUE LYMPH NODE  Final   Special Requests NONE  Final   Fungal Smear   Final    NO YEAST OR FUNGAL ELEMENTS SEEN Performed at [facility]    Culture   Final    CULTURE IN PROGRESS FOR FOUR WEEKS Performed at [facility]    Report Status PENDING  Incomplete  AFB culture with smear     Status: None (Preliminary result)   Collection Time: 03/20/15  5:42 PM  Result Value Ref Range Status   Specimen Description TISSUE LYMPH NODE  Final   Special Requests NONE  Final   Acid Fast Smear   Final    NO ACID FAST BACILLI SEEN Performed at [facility]    Culture   Final    CULTURE WILL BE EXAMINED FOR 6 WEEKS BEFORE ISSUING A FINAL REPORT Performed at [facility]    Report Status PENDING  Incomplete    Radiology Reports Dg Chest 1 View  03/17/2015   CLINICAL DATA:  Right sided thoracentesis, status post removal of 580 cc of yellow fluid. Cough.  EXAM: CHEST  1 VIEW  COMPARISON:  03/14/2015  FINDINGS: No pneumothorax. Mild atelectasis medially at the right lung base and the right perihilar  region. Prior blunting of the right costophrenic angle resolved. There is a moderate left pleural effusion silhouetting the left heart border.  Borderline enlargement  of the cardiopericardial silhouette.  IMPRESSION: 1. Resolution of prior right pleural effusion, with some residual medial right-sided atelectasis. Increase in left pleural effusion, currently moderate in size, with associated passive atelectasis.   Electronically Signed   By: Van Clines M.D.   On: 03/17/2015 14:56   Dg Chest 1 View  03/03/2015   CLINICAL DATA:  Post right thoracentesis  EXAM: CHEST  1 VIEW  COMPARISON:  Chest x-ray of 02/21/2015  FINDINGS: Some of the right pleural effusion has been evacuated. No obvious pneumothorax is seen, but on the frontal view there is a fluid level at the right lung base and a small right hydro pneumothorax is suspected. Opacity at the left lung base persists consistent with left effusion, atelectasis, and possibly pneumonia. Heart size is stable.  IMPRESSION: 1. Suspect small right hydro pneumothorax after right thoracentesis. 2. Reduction of right pleural effusion.   Electronically Signed   By: Ivar Drape M.D.   On: 03/03/2015 16:17   Dg Chest 2 View  03/13/2015   CLINICAL DATA:  Cough  EXAM: CHEST  2 VIEW  COMPARISON:  March 03, 2015 chest radiograph and chest CT February 21, 2015  FINDINGS: There is a small pleural effusion on the right. No pneumothorax appreciable. There is mild bibasilar atelectasis. Lungs are otherwise clear. Heart size and pulmonary vascularity are normal. No adenopathy. There is evidence of sclerosis in several ribs consistent with prior rib trauma. No acute fracture apparent. Nipple shadows are noted bilaterally.  IMPRESSION: Small right pleural effusion. Mild bibasilar atelectasis. No airspace consolidation or edema.   Electronically Signed   By: Lowella Grip III M.D.   On: 03/13/2015 09:20   Dg Chest 2 View  02/21/2015   CLINICAL DATA:  Chest pain and shortness of  breath. Negative pregnancy test.  EXAM: CHEST  2 VIEW  COMPARISON:  12/05/2014  FINDINGS: Lungs are adequately inflated and demonstrate small to moderate bilateral pleural effusions right greater than left which are new. Likely associated atelectasis in the lung bases as basilar infection is also possible person cardiomediastinal silhouette and remainder of the exam is unchanged.  IMPRESSION: Small to moderate bilateral pleural effusions right greater than left. Likely associated bibasilar atelectasis versus infection.   Electronically Signed   By: Marin Olp M.D.   On: 02/21/2015 14:54   Ct Angio Chest Pe W/cm &/or Wo Cm  03/20/2015   CLINICAL DATA:  Shortness of breath. Tachycardia. Elevated D-dimer.  EXAM: CT ANGIOGRAPHY CHEST WITH CONTRAST  TECHNIQUE: Multidetector CT imaging of the chest was performed using the standard protocol during bolus administration of intravenous contrast. Multiplanar CT image reconstructions and MIPs were obtained to evaluate the vascular anatomy.  CONTRAST:  62mL OMNIPAQUE IOHEXOL 350 MG/ML SOLN  COMPARISON:  02/21/2015  FINDINGS: Technically adequate study with good opacification of the central and segmental pulmonary arteries. No focal filling defects. No evidence of significant pulmonary embolus.  Normal heart size. Normal caliber thoracic aorta without evidence of dissection. Great vessel origins are patent. Esophagus is decompressed. No significant lymphadenopathy in the mediastinum. Again demonstrated is lymphadenopathy in the right axilla with enlarged lymph nodes measuring up to about 15 mm short axis dimension. Large left thyroid gland nodule measuring up to 3.5 cm diameter. No change since prior study.  Moderate size bilateral pleural effusions with atelectasis or consolidation in both lung bases. This is similar to prior study. No pneumothorax.  Included portion of upper abdominal organs is unremarkable except for a small accessory spleen. Degenerative  changes in the  spine. No destructive bone lesions.  Review of the MIP images confirms the above findings.  IMPRESSION: No evidence of significant pulmonary embolus. Again demonstrated are moderate bilateral pleural effusions with atelectasis or consolidation in the lung bases. Axillary lymphadenopathy. Appearance is nonspecific and follow-up suggested. Left thyroid gland nodule. Follow-up ultrasound has been suggested.   Electronically Signed   By: Lucienne Capers M.D.   On: 03/20/2015 04:28   Ct Angio Chest W/cm &/or Wo Cm  02/21/2015   CLINICAL DATA:  3 day history of bilateral lower extremity edema, cough, and shortness of breath with intermittent stabbing chest pain radiating to the back.  EXAM: CT ANGIOGRAPHY CHEST WITH CONTRAST  TECHNIQUE: Multidetector CT imaging of the chest was performed using the standard protocol during bolus administration of intravenous contrast. Multiplanar CT image reconstructions and MIPs were obtained to evaluate the vascular anatomy.  CONTRAST:  158mL OMNIPAQUE IOHEXOL 350 MG/ML IV.  COMPARISON:  No prior CT. Two-view chest x-ray earlier same date and 12/05/2014.  FINDINGS: Contrast opacification of the pulmonary arteries is good respiratory motion blurred many of the images of the lung bases. Overall, the study is of good diagnostic quality.  No filling defects within either main pulmonary artery or their branches in either lung to suggest pulmonary embolism. Heart size upper normal. Small pericardial effusion. No visible atherosclerosis involving the thoracic or upper abdominal aorta or their visualized branches. No visible coronary artery atherosclerosis.  Large bilateral pleural effusions including of fluid in the major fissure on the left. Associated dense passive atelectasis in the lower lobes and right middle lobe. No confluent airspace consolidation otherwise. No pulmonary parenchymal nodules or masses. Central airways patent with marked bronchial wall thickening.  Enlarged level 1  and level 2 right axillary lymph nodes, the largest measuring approximately 2.5 x 1.4 cm. No significant mediastinal, hilar or left axillary lymphadenopathy. Mass involving the lower pole of the left lobe of the thyroid gland measuring approximately 4.0 x 3.1 x 4.2 cm.  Visualized upper abdomen unremarkable for the early arterial phase of enhancement. Focus of accessory splenic tissue posterior to the mid spleen. Bone window images demonstrate a likely benign sclerotic bone island in the T6 vertebral body.  Review of the MIP images confirms the above findings.  IMPRESSION: 1. No evidence of pulmonary embolism. 2. Large bilateral pleural effusions including fluid in the major fissure on the left with associated passive atelectasis in the lower lobes and right middle lobe. 3. Marked central bronchial wall thickening consistent with severe bronchitis and/or asthma. 4. Small pericardial effusion. 5. Enlarged right axillary lymph nodes without evidence of lymphadenopathy elsewhere. Isolated unilateral axillary lymphadenopathy can be seen in breast cancer, so if not recently performed, diagnostic mammography may be helpful in further evaluation. 6. Approximate 4 cm mass involving the lower pole of the left lobe of the thyroid gland. Based on size, evaluation with ultrasound and likely biopsy is indicated. This follows ACR consensus guidelines: Managing Incidental Thyroid Nodules Detected on Imaging: White Paper of the ACR Incidental Thyroid Findings Committee. J Am Coll Radiol 2015; 12:143-150.   Electronically Signed   By: Evangeline Dakin M.D.   On: 02/21/2015 19:06   US Soft Tissue Head/neck  02/25/2015   CLINICAL DATA:  Thyroid nodule.  EXAM: THYROID ULTRASOUND  TECHNIQUE: Ultrasound examination of the thyroid gland and adjacent soft tissues was performed.  COMPARISON:  Chest CT 02/21/2015  FINDINGS: Right thyroid lobe  Measurements: 5.8 x 1.8 x 1.9 cm. There  is a subtle heterogeneous nodule along the medial right  thyroid lobe that measures 0.6 x 0.3 x 0.7 cm. There is a heterogeneous nodule in the inferior right thyroid lobe that measures 1.0 x 0.5 x 0.8 cm.  Left thyroid lobe  Measurements: 7.1 x 2.7 x 2.6 cm. There is a dominant nodule occupying the mid and lower aspect of the left thyroid lobe. This lesion corresponds with the nodule seen on the recent CT. This dominant left thyroid nodule measures 4.7 x 2.5 x 4.1 cm. Nodule is heterogeneous with small cystic areas. Cannot exclude small calcifications in this nodule.  Isthmus  Thickness: 0.4 cm.  No nodules visualized.  Lymphadenopathy  None visualized.  IMPRESSION: Bilateral thyroid nodules. There is a dominant left thyroid nodule that measures up to 4.7 cm. Findings meet consensus criteria for biopsy. Ultrasound-guided fine needle aspiration should be considered, as per the consensus statement: Management of Thyroid Nodules Detected at [place]: Society of Radiologists in [place]. Radiology 2005; [id number].   Electronically Signed   By: Markus Daft M.D.   On: 02/25/2015 16:21   US Thyroid Biopsy  03/10/2015   CLINICAL DATA:  44 year old female with a history of left-sided thyroid nodule. She has been referred for percutaneous biopsy.  EXAM: ULTRASOUND GUIDED NEEDLE ASPIRATE BIOPSY OF THE THYROID GLAND  COMPARISON:  02/25/2015  PROCEDURE: The procedure, risks, benefits, and alternatives were explained to the patient. Questions regarding the procedure were encouraged and answered. The patient understands and consents to the procedure.  Ultrasound survey was performed with images stored and sent to PACs.  The left neck was prepped with Betadine in a sterile fashion, and a sterile drape was applied covering the operative field. A sterile gown and sterile gloves were used for the procedure. Local anesthesia was provided with 1% Lidocaine.  Ultrasound guidance was used to infiltrate the region with 1% lidocaine for local anesthesia. Four  separate 25 gauge fine needle biopsy were then acquired of the left thyroid nodule using ultrasound guidance. Images were stored.  Slide preparation was performed.  Final image was stored after biopsy.  Patient tolerated the procedure well and remained hemodynamically stable throughout.  No complications were encountered and no significant blood loss was encounter  FINDINGS: Ultrasound survey of the left thyroid demonstrates heterogeneous nodule inferiorly.  Images during the case demonstrate needle tip within the nodule on each needle pass.  Final ultrasound image demonstrates no complicating features.  IMPRESSION: Status post ultrasound-guided biopsy of left inferior thyroid nodule. Specimen sent to pathology for complete histopathologic analysis.  Signed,  Dulcy Fanny. Earleen Newport, DO  [facility]  [facility]   Electronically Signed   By: Corrie Mckusick D.O.   On: 03/10/2015 14:44   Dg Chest Port 1 View  03/20/2015   CLINICAL DATA:  Walking Corpse syndrome  EXAM: PORTABLE CHEST - 1 VIEW  COMPARISON:  03/17/2015  FINDINGS: The cardiac enlargement with pulmonary vascular congestion and perihilar edema, increasing since prior study. Small to moderate left pleural effusion is also increasing. No pneumothorax.  IMPRESSION: Increasing pulmonary vascular congestion, edema, and left pleural effusion since prior study.   Electronically Signed   By: Lucienne Capers M.D.   On: 03/20/2015 00:11   Dg Chest Port 1 View  03/14/2015   CLINICAL DATA:  Abnormal labs, suspicious for infection.  EXAM: PORTABLE CHEST - 1 VIEW  COMPARISON:  03/12/2015  FINDINGS: There is persistent right pleural fluid or thickening in the lateral base.  Mild linear basilar opacities persist and may represent scarring. Lungs are otherwise clear. Hilar, mediastinal and cardiac contours are unremarkable and unchanged. Pulmonary vasculature is normal.  IMPRESSION: Unchanged, would minimal linear basilar opacities  and right lateral pleural fluid/thickening.   Electronically Signed   By: Andreas Newport M.D.   On: 03/14/2015 03:09   Dg Knee Complete 4 Views Right  03/05/2015   CLINICAL DATA:  Acute onset of right knee pain and swelling. Initial encounter.  EXAM: RIGHT KNEE - COMPLETE 4+ VIEW  COMPARISON:  None.  FINDINGS: There is no evidence of fracture or dislocation. The joint spaces are preserved. No significant degenerative change is seen; the patellofemoral joint is grossly unremarkable in appearance.  A small knee joint effusion is noted. The visualized soft tissues are otherwise unremarkable.  IMPRESSION: 1. No evidence of fracture or dislocation. 2. Small knee joint effusion noted.   Electronically Signed   By: Garald Balding M.D.   On: 03/05/2015 22:31   Dg Hand Complete Right  03/05/2015   CLINICAL DATA:  Bilateral hand pain and swelling. Initial encounter.  EXAM: RIGHT HAND - COMPLETE 3+ VIEW  COMPARISON:  None.  FINDINGS: There is no evidence of fracture or dislocation. The joint spaces are preserved. The carpal rows are intact, and demonstrate normal alignment. Mild dorsal soft tissue swelling is noted about the wrist.  IMPRESSION: No evidence of fracture or dislocation.   Electronically Signed   By: Garald Balding M.D.   On: 03/05/2015 22:30   Dg Knee Ap/lat W/sunrise Left  03/19/2015   CLINICAL DATA:  Pain and swelling.  No appreciable trauma  EXAM: LEFT KNEE 3 VIEWS  COMPARISON:  None.  FINDINGS: Frontal, lateral, and sunrise patellar images were obtained. There is no fracture or dislocation. Joint spaces appear intact. No effusion. No erosive change.  IMPRESSION: No fracture or effusion.  No appreciable arthropathy.   Electronically Signed   By: Lowella Grip III M.D.   On: 03/19/2015 13:55   Dg Knee Ap/lat W/sunrise Right  03/19/2015   CLINICAL DATA:  Pain and swelling.  No history of trauma  EXAM: RIGHT KNEE 3 VIEWS  COMPARISON:  March 05, 2015  FINDINGS: Frontal, lateral, and sunrise  patellar images were obtained. No fracture or dislocation. No appreciable joint effusion. Joint spaces appear intact. No erosive change.  IMPRESSION: No fracture or effusion.  No appreciable arthropathy.   Electronically Signed   By: Lowella Grip III M.D.   On: 03/19/2015 13:57   US Thoracentesis Asp Pleural Space W/img Guide  03/17/2015   INDICATION: Symptomatic R sided pleural effusion  EXAM: US THORACENTESIS ASP PLEURAL SPACE W/IMG GUIDE  COMPARISON:  None.  MEDICATIONS: 10 cc 1% lidocaine  COMPLICATIONS: None immediate  TECHNIQUE: Informed written consent was obtained from the patient after a discussion of the risks, benefits and alternatives to treatment. A timeout was performed prior to the initiation of the procedure.  Initial ultrasound scanning demonstrates a right pleural effusion. The lower chest was prepped and draped in the usual sterile fashion. 1% lidocaine was used for local anesthesia.  Under direct ultrasound guidance, a 19 gauge, 7-cm, Yueh catheter was introduced. An ultrasound image was saved for documentation purposes. the thoracentesis was performed. The catheter was removed and a dressing was applied. The patient tolerated the procedure well without immediate post procedural complication. The patient was escorted to have an upright chest radiograph.  FINDINGS: A total of approximately 580 cc of yellow fluid was removed. Requested samples  were sent to the laboratory.  IMPRESSION: Successful ultrasound-guided R sided thoracentesis yielding 580 cc of pleural fluid.  Read by:  Lavonia Drafts [facility]   Electronically Signed   By: Sandi Mariscal M.D.   On: 03/17/2015 15:13   US Thoracentesis Asp Pleural Space W/img Guide  03/03/2015   INDICATION: Dyspnea, bilateral pleural effusions; request is made for diagnostic and therapeutic right thoracentesis.  EXAM: ULTRASOUND GUIDED DIAGNOSTIC AND THERAPEUTIC RIGHT THORACENTESIS  COMPARISON:  None.  MEDICATIONS: None  COMPLICATIONS: None immediate   TECHNIQUE: Informed written consent was obtained from the patient via interpreter after a discussion of the risks, benefits and alternatives to treatment. A timeout was performed prior to the initiation of the procedure.  Initial ultrasound scanning demonstrates a pleural effusion. The lower chest was prepped and draped in the usual sterile fashion. 1% lidocaine was used for local anesthesia.  An ultrasound image was saved for documentation purposes. A 6 Fr Safe-T-Centesis catheter was introduced. The thoracentesis was performed. The catheter was removed and a dressing was applied. The patient tolerated the procedure well without immediate post procedural complication. The patient was escorted to have an upright chest radiograph.  FINDINGS: A total of approximately 360 cc's of slightly turbid ,yellow fluid was removed. Requested samples were sent to the laboratory.  IMPRESSION: Successful ultrasound-guided diagnostic and therapeutic right sided thoracentesis yielding 360 cc's of pleural fluid.  Read by: Rowe Robert, PA-C   Electronically Signed   By: Markus Daft M.D.   On: 03/03/2015 16:41    CBC  Recent Labs Lab 03/19/15 0612 03/20/15 0509 03/21/15 0540 03/23/15 0839  WBC 4.2 4.7 3.8* 6.2  HGB 8.0* 8.2* 8.0* 9.5*  HCT 24.5* 25.6* 25.1* 29.7*  PLT 417* 395 425* 458*  MCV 83.6 83.9 84.2 84.4  MCH 27.3 26.9 26.8 27.0  MCHC 32.7 32.0 31.9 32.0  RDW 13.8 14.0 14.0 14.3  LYMPHSABS  --   --   --  1.6  MONOABS  --   --   --  0.7  EOSABS  --   --   --  0.4  BASOSABS  --   --   --  0.1    Chemistries   Recent Labs Lab 03/19/15 0612 03/20/15 0509 03/21/15 0540 03/23/15 0839  NA 136 138 138 138  K 3.7 3.6 3.4* 4.1  CL 106 106 105 105  CO2 21 23 23 20   GLUCOSE 124* 114* 95 168*  BUN <5* <5* <5* <5*  CREATININE 0.50 0.46* 0.54 0.62  CALCIUM 7.9* 8.4 7.9* 8.8  AST 24  --   --  23  ALT 14  --   --  7  ALKPHOS 63  --   --  68  BILITOT 0.3  --   --  0.7    ------------------------------------------------------------------------------------------------------------------ estimated creatinine clearance is 71.7 mL/min (by C-G formula based on Cr of 0.62). ------------------------------------------------------------------------------------------------------------------ No results for input(s): HGBA1C in the last 72 hours. ------------------------------------------------------------------------------------------------------------------ No results for input(s): CHOL, HDL, LDLCALC, TRIG, CHOLHDL, LDLDIRECT in the last 72 hours. ------------------------------------------------------------------------------------------------------------------ No results for input(s): TSH, T4TOTAL, T3FREE, THYROIDAB in the last 72 hours.  Invalid input(s): FREET3 ------------------------------------------------------------------------------------------------------------------  Recent Labs  03/21/15 0540  VITAMINB12 239  FOLATE 9.4  FERRITIN 190  TIBC 137*  IRON 13*  RETICCTPCT 2.1    Coagulation profile No results for input(s): INR, PROTIME in the last 168 hours.  No results for input(s): DDIMER in the last 72 hours.  Cardiac Enzymes No results for input(s):  CKMB, TROPONINI, MYOGLOBIN in the last 168 hours.  Invalid input(s): CK ------------------------------------------------------------------------------------------------------------------ Invalid input(s): POCBNP    Vinay Ertl D.O. on 03/23/2015 at 12:02 PM  Between 7am to 7pm - Pager - [phone]  After 7pm go to [email or web address] - password [email or web address]  And look for the night coverage person covering for me after hours  [facility] Office  [phone]

## 2015-03-24 ENCOUNTER — Encounter (HOSPITAL_COMMUNITY): Payer: Self-pay | Admitting: Surgery

## 2015-03-24 LAB — BASIC METABOLIC PANEL
Anion gap: 7 (ref 5–15)
CO2: 27 mmol/L (ref 19–32)
Calcium: 8.4 mg/dL (ref 8.4–10.5)
Chloride: 101 mmol/L (ref 96–112)
Creatinine, Ser: 0.54 mg/dL (ref 0.50–1.10)
GFR calc non Af Amer: >90 mL/min (ref >90)
Glucose, Bld: 113 mg/dL — ABNORMAL HIGH (ref 70–99)
POTASSIUM: 4 mmol/L (ref 3.5–5.1)
Sodium: 135 mmol/L (ref 135–145)

## 2015-03-24 LAB — TISSUE CULTURE: Culture: NO GROWTH

## 2015-03-24 LAB — CBC
HCT: 25.6 % — ABNORMAL LOW (ref 36.0–46.0)
Hemoglobin: 8.2 g/dL — ABNORMAL LOW (ref 12.0–15.0)
MCH: 26.5 pg (ref 26.0–34.0)
MCHC: 32 g/dL (ref 30.0–36.0)
MCV: 82.6 fL (ref 78.0–100.0)
Platelets: 530 K/uL — ABNORMAL HIGH (ref 150–400)
RBC: 3.1 MIL/uL — ABNORMAL LOW (ref 3.87–5.11)
RDW: 14.4 % (ref 11.5–15.5)
WBC: 6.8 K/uL (ref 4.0–10.5)

## 2015-03-24 NOTE — Evaluation (Signed)
Physical Therapy Evaluation Patient Details Name: Nichole Perry MRN: [id number] DOB: 1971-01-08 Today's Date: 03/24/2015   History of Present Illness  Nichole Perry is a 44 y.o. female from [place] who began having fevers, chills and sweats a little over 3 weeks ago. She is also noted migratory joint swelling and pain.She first began in both feet and both knees. The swelling and pain in her feet has improved but now she has swelling in her left wrist. She is also had shortness of breath and was found to have bilateral pleural effusions on chest x-ray and CT scan. There was some atelectasis in the bases but no underlying infiltrates were noted. She underwent thoracentesis revealing exudative effusions. Gram stain and AFB stain were negative. Routine cultures were negative and AFB cultures are negative to date.Her Quantiferon TB assay was also positive. Pleural fluid cytology showed reactive mesothelial cells.   Clinical Impression  Pt admitted with above diagnosis. Pt currently with functional limitations due to the deficits listed below (see PT Problem List).  Pt will benefit from skilled PT to increase their independence and safety with mobility to allow discharge to the venue listed below.  Pt able to ambulate in room with MIN/guard with no AD.  As gait progressed, her L knee did begin to bother her more, but no LOB and did not require any increased A.  Pt reports no pain with open chained activities, only as gait progressed, but that it is much better than it had been.    Pt with decreased B hand grip and difficulty with thumb to fingertip opposition. She also reports difficulty opening containers. Pt would benefit from OT consult.    Follow Up Recommendations No PT follow up    Equipment Recommendations  None recommended by PT    Recommendations for Other Services OT consult     Precautions / Restrictions Precautions Precautions: Other (comment) Precaution Comments: Airborne with N-95 mask       Mobility  Bed Mobility Overal bed mobility: Modified Independent                Transfers Overall transfer level: Modified independent                  Ambulation/Gait Ambulation/Gait assistance: Min guard Ambulation Distance (Feet): 120 Feet (in room) Assistive device: None Gait Pattern/deviations: Decreased stance time - left;Antalgic Gait velocity: decreased and guarded   General Gait Details: Amb in room only due to Airborne precautions.  Pt able to ambulate without UE assist, but as gait progressed L LE soreness became more evident with increase in antalgic gait.  Pt reports no soreness with open chained activites, only with WB.  Offered RW for pt to try and she declined, plus due to decreased grip think it would have been difficult for pt to use anyway.  Stairs            Wheelchair Mobility    Modified Rankin (Stroke Patients Only)       Balance Overall balance assessment: Needs assistance   Sitting balance-Leahy Scale: Good       Standing balance-Leahy Scale: Fair                               Pertinent Vitals/Pain Pain Assessment: Faces Faces Pain Scale: Hurts little more Pain Location: L knee Pain Intervention(s): Limited activity within patient's tolerance;Monitored during session;Repositioned    Home Living Family/patient expects to be discharged to::  Private residence Living Arrangements: Spouse/significant other Available Help at Discharge: Available PRN/intermittently;Family;Friend(s) Type of Home: Apartment Home Access: Stairs to enter   Entrance Stairs-Number of Steps: 1 Home Layout: One level Home Equipment: None      Prior Function Level of Independence: Independent               Hand Dominance   Dominant Hand: Right    Extremity/Trunk Assessment   Upper Extremity Assessment: RUE deficits/detail;LUE deficits/detail RUE Deficits / Details: decreased grip and difficulty with opposition       LUE Deficits / Details: decreased grip and difficulty with opposition    Lower Extremity Assessment: Generalized weakness;Overall [facility] for tasks assessed      Cervical / Trunk Assessment: Normal  Communication   Communication: Prefers language other than [place];Other (comment) (Pt understands most English)  Cognition Arousal/Alertness: Awake/alert Behavior During Therapy: WFL for tasks assessed/performed Overall Cognitive Status: Within Functional Limits for tasks assessed                      General Comments General comments (skin integrity, edema, etc.): Pt understands most English and no translator needed.    Exercises        Assessment/Plan    PT Assessment Patient needs continued PT services  PT Diagnosis Generalized weakness;Difficulty walking   PT Problem List Decreased activity tolerance;Decreased balance;Decreased mobility;Pain;Decreased knowledge of use of DME  PT Treatment Interventions Gait training;Functional mobility training;Therapeutic activities;Balance training   PT Goals (Current goals can be found in the Care Plan section) Acute Rehab PT Goals Patient Stated Goal: None stated PT Goal Formulation: With patient Time For Goal Achievement: 04/07/15 Potential to Achieve Goals: Good    Frequency Min 3X/week   Barriers to discharge        Co-evaluation               End of Session   Activity Tolerance: Patient tolerated treatment well Patient left: in chair;with call bell/phone within reach Nurse Communication: Mobility status         Time: 1108-1130 PT Time Calculation (min) (ACUTE ONLY): 22 min   Charges:   PT Evaluation $Initial PT Evaluation Tier I: 1 Procedure     PT G Codes:        Fritzi Scripter LUBECK 03/24/2015, 12:32 PM

## 2015-03-24 NOTE — Progress Notes (Addendum)
[facility]                                                                              Patient Demographics  Nichole Perry, is a 44 y.o. female, DOB - January 04, 1971, WHQ:[id number]  Admit date - 03/14/2015   Admitting Physician Lavina Hamman, MD  Outpatient Primary MD for the patient is Maximino Greenland, MD  LOS - 10   Chief Complaint  Patient presents with  . Abnormal Lab      Brief history 44 year old female with a history of goiter and anemia presented to the hospital when she was called by her primary care provider with positive blood cultures. The patient has not been feeling well for nearly 3 weeks. The patient initially presented to her primary care provider because of bilateral knee pain and leg swelling on 02/21/2015. She was started on furosemide 20 mg daily. Chest x-ray at that time revealed small to moderate bilateral pleural effusions. CT angio of the chest was performed on 02/21/2015 which was negative for pulmonary embolus but revealed bilateral pleural effusions and central bronchial wall thickening and enlarged right axillary lymph node. She underwent thoracocentesis on 03/03/2015. Fluid chemistries revealed an exudative pleural effusion with WBC 3000 (64% monos, 28% PMN, 8% eosinophils). Routine Fluid culture and AFB culture are negative at this time. 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 The patient followed up on 03/07/2015, and an autoimmune workup was begun which was negative. Again, the patient represented to the primary care provider office on 03/12/2015 with 2 day history of fever up to 101.62F. Blood cultures were obtained at that time are growing CoNS. Notably, the patient also was noted to have a right thyroid nodule which has been biopsied on 03/10/2015. The biopsy revealed benign follicular nodule. Since admission, the patient has continued to have fevers. Workup continues in progress. Patient has had R knee aspiration, axillary lymph node biopsy.  No fungal or  yeast has grown out of any culture.  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   Assessment & Plan   Sepsis -Continues to be febrile over the past 24 hours, Tmax 100.38F  -Secondary to bacteremia ? Vs TB -Lactic acid 2.22 on 03/14/2015 -Continue IV cefazolin -flu is neg -Given TB meds, will not use tylenol for fever. Motrin ordered.   Dyspnea  -Resolved, patient became tachypneic with tachycardia overnight on 4/7-8 -D-dimer was noted to be elevated, CT chest was conducted showing no pulmonary embolism however moderate bilateral pleural effusions. -IVF DISCONTINUED (not sure why IVF were restarted overnight) -Repeat CXR on 03/23/2015: Decreased in B/L pleural effusion, now small volume  -Continue low dose lasix -Weaned off of nasal canula  Bacteremia-- Coag negative Staph  -unclear presently if true bacteremia vs contaminant as 4/2 repeat blood cultures remain negative to date (had not yet started antibiotics) -may be partly contributing to clinical picture, but doubt is the entire answer -ID following -Echocardiogram: EF 60%, no gross vegetation -follow surveillance blood cultures--neg to date -TEE cannot be done at this time as patient is on airborne precautions -Continue cefazolin, needs 14 days total- through 03/27/2015  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 -03/17/15 repeat  thoracocentesis WBC 797 (75% lymphs), glucose 150-- MTB PCR, ADA, glucose, cytology/ culture show no growth -given pt's clinical and epidemiologic hx, will still need to r/o pulmonary TB in setting of lymphocytic effusion -airborne isolation--await sputum although pt is barely coughing and unable to induce sputum -Spoke with Pulm regarding possible VATS (pleural bx)- however no longer available; Spoke with [facility], no longer done there either -General surgery consulted and appreciated, s/p Right axillary lymph node  biopsy -Tissue sample: no AFB, no yeast or fungal elements -Spoke with ID (quantiferon + in March 2016)- TB treatment started 4/9- Rifampin, Isoniazid, Pyridoxine, ethambutol with Vitamin B6 -Patient will need to follow up with the Health Department, spoke with Dr. Patience Musca [phone]); CM will contact TB RN on day of discharge.   Transaminasemia -Resolved, secondary to sepsis -No abdominal pain, no vomiting -Hepatitis B surface antigen, hepatitis C antibody--negative -HIV antibody is negative  Arthralgias and myalgias/ Lower extremity and hand edema and pain -Autoimmune workup has been negative including C3, C4, anti-CCP, ANA, RF, ENA, anti-dsDNA, Anti-SmAb -Lower ext doppler negative for DVT -improving with standard antibiotics -Continues to have swelling in upper ext and knees- improved -Consulted orthopedics- conducted aspiration of right knee at bedside -Right knee fluid culture: no organisms seen; no yeast or fungal elements -knee pain seems have to have improved, but complains of pain and swelling in hands -PT consulted  Normocytic Anemia -Likely chronic. Hb upon admission 10, fluctuates Hb between 8-9 (drop possibly dilutional) -Anemia panel: Iron 13, Ferritin 190, Vit B12 239 -Patient given dose of ferahem and B12  Hypoalbuminemia/malnutrition -Nutrition consulted -Continue megace and nutritional supplements -Given dose of albumin, increased from 1.6 to 2.2 -Encouraged patient to eat  Constipation  -Improved, continue bowel regimen  Code Status: Full  Family Communication: Husband at bedside  Disposition Plan: Admitted.  Will continue to monitor as patient continues to spike fevers.     On 03/19/2015, Spoke with [facility] and [facility]- had no recommendations and did not feel patient needed to transfer.   Time Spent in minutes   30 minutes  Procedures  Echocardiogram US Thoracentesis Right knee aspiration Right axillary lymph node biopsy  Consults    Pulmonology Interventional radiology Infectious disease Orthopedic surgery General surgery  DVT Prophylaxis  SCDs  Lab Results  Component Value Date   PLT 530* 03/24/2015    Medications  Scheduled Meds: . antiseptic oral rinse  7 mL Mouth Rinse BID  .  ceFAZolin (ANCEF) IV  2 g Intravenous 3 times per day  . cyanocobalamin  1,000 mcg Intramuscular Once  . ethambutol  15 mg/kg Oral Daily  . feeding supplement (ENSURE)  1 Container Oral TID BM  . furosemide  20 mg Oral Daily  . isoniazid  300 mg Oral Daily  . megestrol  40 mg Oral Daily  . multivitamin with minerals  1 tablet Oral Daily  . pyrazinamide  1,000 mg Oral Daily  . vitamin B-6  50 mg Oral Daily  . rifampin  600 mg Oral Daily  . senna-docusate  1 tablet Oral QHS   Continuous Infusions: . lactated ringers 10 mL/hr at 03/20/15 1629   PRN Meds:.ibuprofen, oxyCODONE  Antibiotics    Anti-infectives    Start     Dose/Rate Route Frequency Ordered Stop   03/21/15 1300  pyrazinamide tablet 1,000 mg     1,000 mg Oral Daily 03/21/15 1200     03/21/15 1230  ethambutol (MYAMBUTOL) tablet 850 mg     15 mg/kg  57.2 kg Oral Daily 03/21/15 1200     03/21/15 1230  rifampin (RIFADIN) capsule 600 mg     600 mg Oral Daily 03/21/15 1200     03/21/15 1230  isoniazid (NYDRAZID) tablet 300 mg     300 mg Oral Daily 03/21/15 1200     03/14/15 1445  ceFAZolin (ANCEF) IVPB 2 g/50 mL premix     2 g 100 mL/hr over 30 Minutes Intravenous 3 times per day 03/14/15 1426     03/14/15 1400  piperacillin-tazobactam (ZOSYN) IVPB 3.375 g  Status:  Discontinued     3.375 g 12.5 mL/hr over 240 Minutes Intravenous 3 times per day 03/14/15 1319 03/14/15 1426   03/14/15 0830  piperacillin-tazobactam (ZOSYN) IVPB 3.375 g  Status:  Discontinued     3.375 g 12.5 mL/hr over 240 Minutes Intravenous Every 8 hours 03/14/15 0217 03/14/15 0651   03/14/15 0230  vancomycin (VANCOCIN) IVPB 750 mg/150 ml premix  Status:  Discontinued     750 mg 150 mL/hr  over 60 Minutes Intravenous Every 12 hours 03/14/15 0217 03/16/15 1151   03/14/15 0215  piperacillin-tazobactam (ZOSYN) IVPB 3.375 g     3.375 g 100 mL/hr over 30 Minutes Intravenous  Once 03/14/15 0213 03/14/15 0323        Subjective:   [name] seen and examined today.  Patient feels better today and feels that she is has a better grip.  Denies chest pain or shortness of breath, abdominal pain.  She was able to have a bowel movement overnight.     Objective:   Filed Vitals:   03/23/15 0834 03/23/15 1357 03/23/15 2050 03/24/15 0606  BP:  101/58 103/50 106/58  Pulse: 102 117 102 107  Temp: 98.4 F (36.9 C) 100.3 F (37.9 C) 98.2 F (36.8 C) 100 F (37.8 C)  TempSrc:  Oral Oral Oral  Resp: 20 18    Height:      Weight:    56.11 kg (123 lb 11.2 oz)  SpO2: 100% 94% 96% 96%    Wt Readings from Last 3 Encounters:  03/24/15 56.11 kg (123 lb 11.2 oz)  03/12/15 52.527 kg (115 lb 12.8 oz)  03/05/15 53.524 kg (118 lb)     Intake/Output Summary (Last 24 hours) at 03/24/15 1137 Last data filed at 03/23/15 1700  Gross per 24 hour  Intake    220 ml  Output      0 ml  Net    220 ml    Exam  General: Well developed, NAD  Cardiovascular: S1 S2 auscultated,RRR, no murmurs  Respiratory: Clear but slightly diminished at the bases  Abdomen: Soft, nontender, nondistended, + bowel sounds  Extremities:minimal B/L hand/wrist/ankle/foot edema  Neuro: AAOx3, nonfocal, able to move all extremities  Psych: Pleasant, appropriate mood and affect   Data Review   Micro Results Recent Results (from the past 240 hour(s))  Culture, blood (routine x 2)     Status: None   Collection Time: 03/15/15  9:40 AM  Result Value Ref Range Status   Specimen Description BLOOD LEFT ARM  Final   Special Requests BOTTLES DRAWN AEROBIC ONLY 10 CC  Final   Culture   Final    NO GROWTH 5 DAYS Note: Culture results may be compromised due to an excessive volume of blood received in culture  bottles. Performed at [facility]    Report Status 03/21/2015 FINAL  Final  Culture, blood (routine x 2)     Status: None  Collection Time: 03/15/15  9:45 AM  Result Value Ref Range Status   Specimen Description BLOOD LEFT WRIST  Final   Special Requests BOTTLES DRAWN AEROBIC ONLY 10 CC  Final   Culture   Final    NO GROWTH 5 DAYS Note: Culture results may be compromised due to an excessive volume of blood received in culture bottles. Performed at [facility]    Report Status 03/21/2015 FINAL  Final  Body fluid culture     Status: None   Collection Time: 03/17/15  2:27 PM  Result Value Ref Range Status   Specimen Description FLUID RIGHT PLEURAL  Final   Special Requests NONE  Final   Gram Stain   Final    RARE WBC PRESENT,BOTH PMN AND MONONUCLEAR NO ORGANISMS SEEN Performed at [facility]    Culture   Final    NO GROWTH 3 DAYS Performed at [facility]    Report Status 03/21/2015 FINAL  Final  Fungus Culture with Smear     Status: None (Preliminary result)   Collection Time: 03/19/15 12:18 PM  Result Value Ref Range Status   Specimen Description FLUID SYNOVIAL RIGHT KNEE  Final   Special Requests Normal  Final   Fungal Smear   Final    NO YEAST OR FUNGAL ELEMENTS SEEN Performed at [facility]    Culture   Final    CULTURE IN PROGRESS FOR FOUR WEEKS Performed at [facility]    Report Status PENDING  Incomplete  Body fluid culture     Status: None   Collection Time: 03/19/15 12:19 PM  Result Value Ref Range Status   Specimen Description FLUID SYNOVIAL RIGHT KNEE  Final   Special Requests Normal  Final   Gram Stain   Final    MODERATE WBC PRESENT, PREDOMINANTLY PMN NO ORGANISMS SEEN Performed at [facility]    Culture   Final    NO GROWTH 3 DAYS Performed at [facility]    Report Status 03/22/2015 FINAL  Final  AFB culture with smear     Status: None (Preliminary result)   Collection Time:  03/19/15 12:19 PM  Result Value Ref Range Status   Specimen Description FLUID SYNOVIAL RIGHT KNEE  Final   Special Requests Normal  Final   Acid Fast Smear   Final    NO ACID FAST BACILLI SEEN Performed at [facility]    Culture   Final    CULTURE WILL BE EXAMINED FOR 6 WEEKS BEFORE ISSUING A FINAL REPORT Performed at [facility]    Report Status PENDING  Incomplete  Culture, blood (routine x 2)     Status: None (Preliminary result)   Collection Time: 03/20/15  5:00 AM  Result Value Ref Range Status   Specimen Description BLOOD LEFT ARM  Final   Special Requests BOTTLES DRAWN AEROBIC AND ANAEROBIC 5ML  Final   Culture   Final           BLOOD CULTURE RECEIVED NO GROWTH TO DATE CULTURE WILL BE HELD FOR 5 DAYS BEFORE ISSUING A FINAL NEGATIVE REPORT Performed at [facility]    Report Status PENDING  Incomplete  Culture, blood (routine x 2)     Status: None (Preliminary result)   Collection Time: 03/20/15  5:10 AM  Result Value Ref Range Status   Specimen Description BLOOD RIGHT ARM  Final   Special Requests BOTTLES DRAWN AEROBIC AND ANAEROBIC 5ML  Final  Culture   Final           BLOOD CULTURE RECEIVED NO GROWTH TO DATE CULTURE WILL BE HELD FOR 5 DAYS BEFORE ISSUING A FINAL NEGATIVE REPORT Performed at [facility]    Report Status PENDING  Incomplete  Tissue culture     Status: None   Collection Time: 03/20/15  5:42 PM  Result Value Ref Range Status   Specimen Description TISSUE LYMPH NODE  Final   Special Requests NONE  Final   Gram Stain   Final    FEW WBC PRESENT, PREDOMINANTLY MONONUCLEAR NO ORGANISMS SEEN Performed at [facility]    Culture   Final    NO GROWTH 3 DAYS Performed at [facility]    Report Status 03/24/2015 FINAL  Final  Fungus Culture with Smear     Status: None (Preliminary result)   Collection Time: 03/20/15  5:42 PM  Result Value Ref Range Status   Specimen Description TISSUE LYMPH NODE  Final    Special Requests NONE  Final   Fungal Smear   Final    NO YEAST OR FUNGAL ELEMENTS SEEN Performed at [facility]    Culture   Final    CULTURE IN PROGRESS FOR FOUR WEEKS Performed at [facility]    Report Status PENDING  Incomplete  AFB culture with smear     Status: None (Preliminary result)   Collection Time: 03/20/15  5:42 PM  Result Value Ref Range Status   Specimen Description TISSUE LYMPH NODE  Final   Special Requests NONE  Final   Acid Fast Smear   Final    NO ACID FAST BACILLI SEEN Performed at [facility]    Culture   Final    CULTURE WILL BE EXAMINED FOR 6 WEEKS BEFORE ISSUING A FINAL REPORT Performed at [facility]    Report Status PENDING  Incomplete    Radiology Reports Dg Chest 1 View  03/17/2015   CLINICAL DATA:  Right sided thoracentesis, status post removal of 580 cc of yellow fluid. Cough.  EXAM: CHEST  1 VIEW  COMPARISON:  03/14/2015  FINDINGS: No pneumothorax. Mild atelectasis medially at the right lung base and the right perihilar region. Prior blunting of the right costophrenic angle resolved. There is a moderate left pleural effusion silhouetting the left heart border.  Borderline enlargement of the cardiopericardial silhouette.  IMPRESSION: 1. Resolution of prior right pleural effusion, with some residual medial right-sided atelectasis. Increase in left pleural effusion, currently moderate in size, with associated passive atelectasis.   Electronically Signed   By: Van Clines M.D.   On: 03/17/2015 14:56   Dg Chest 1 View  03/03/2015   CLINICAL DATA:  Post right thoracentesis  EXAM: CHEST  1 VIEW  COMPARISON:  Chest x-ray of 02/21/2015  FINDINGS: Some of the right pleural effusion has been evacuated. No obvious pneumothorax is seen, but on the frontal view there is a fluid level at the right lung base and a small right hydro pneumothorax is suspected. Opacity at the left lung base persists consistent with left  effusion, atelectasis, and possibly pneumonia. Heart size is stable.  IMPRESSION: 1. Suspect small right hydro pneumothorax after right thoracentesis. 2. Reduction of right pleural effusion.   Electronically Signed   By: Ivar Drape M.D.   On: 03/03/2015 16:17   Dg Chest 2 View  03/13/2015   CLINICAL DATA:  Cough  EXAM: CHEST  2 VIEW  COMPARISON:  March 03, 2015 chest radiograph and chest CT February 21, 2015  FINDINGS: There is a small pleural effusion on the right. No pneumothorax appreciable. There is mild bibasilar atelectasis. Lungs are otherwise clear. Heart size and pulmonary vascularity are normal. No adenopathy. There is evidence of sclerosis in several ribs consistent with prior rib trauma. No acute fracture apparent. Nipple shadows are noted bilaterally.  IMPRESSION: Small right pleural effusion. Mild bibasilar atelectasis. No airspace consolidation or edema.   Electronically Signed   By: Lowella Grip III M.D.   On: 03/13/2015 09:20   Ct Angio Chest Pe W/cm &/or Wo Cm  03/20/2015   CLINICAL DATA:  Shortness of breath. Tachycardia. Elevated D-dimer.  EXAM: CT ANGIOGRAPHY CHEST WITH CONTRAST  TECHNIQUE: Multidetector CT imaging of the chest was performed using the standard protocol during bolus administration of intravenous contrast. Multiplanar CT image reconstructions and MIPs were obtained to evaluate the vascular anatomy.  CONTRAST:  53mL OMNIPAQUE IOHEXOL 350 MG/ML SOLN  COMPARISON:  02/21/2015  FINDINGS: Technically adequate study with good opacification of the central and segmental pulmonary arteries. No focal filling defects. No evidence of significant pulmonary embolus.  Normal heart size. Normal caliber thoracic aorta without evidence of dissection. Great vessel origins are patent. Esophagus is decompressed. No significant lymphadenopathy in the mediastinum. Again demonstrated is lymphadenopathy in the right axilla with enlarged lymph nodes measuring up to about 15 mm short axis dimension.  Large left thyroid gland nodule measuring up to 3.5 cm diameter. No change since prior study.  Moderate size bilateral pleural effusions with atelectasis or consolidation in both lung bases. This is similar to prior study. No pneumothorax.  Included portion of upper abdominal organs is unremarkable except for a small accessory spleen. Degenerative changes in the spine. No destructive bone lesions.  Review of the MIP images confirms the above findings.  IMPRESSION: No evidence of significant pulmonary embolus. Again demonstrated are moderate bilateral pleural effusions with atelectasis or consolidation in the lung bases. Axillary lymphadenopathy. Appearance is nonspecific and follow-up suggested. Left thyroid gland nodule. Follow-up ultrasound has been suggested.   Electronically Signed   By: Lucienne Capers M.D.   On: 03/20/2015 04:28   US Soft Tissue Head/neck  02/25/2015   CLINICAL DATA:  Thyroid nodule.  EXAM: THYROID ULTRASOUND  TECHNIQUE: Ultrasound examination of the thyroid gland and adjacent soft tissues was performed.  COMPARISON:  Chest CT 02/21/2015  FINDINGS: Right thyroid lobe  Measurements: 5.8 x 1.8 x 1.9 cm. There is a subtle heterogeneous nodule along the medial right thyroid lobe that measures 0.6 x 0.3 x 0.7 cm. There is a heterogeneous nodule in the inferior right thyroid lobe that measures 1.0 x 0.5 x 0.8 cm.  Left thyroid lobe  Measurements: 7.1 x 2.7 x 2.6 cm. There is a dominant nodule occupying the mid and lower aspect of the left thyroid lobe. This lesion corresponds with the nodule seen on the recent CT. This dominant left thyroid nodule measures 4.7 x 2.5 x 4.1 cm. Nodule is heterogeneous with small cystic areas. Cannot exclude small calcifications in this nodule.  Isthmus  Thickness: 0.4 cm.  No nodules visualized.  Lymphadenopathy  None visualized.  IMPRESSION: Bilateral thyroid nodules. There is a dominant left thyroid nodule that measures up to 4.7 cm. Findings meet consensus  criteria for biopsy. Ultrasound-guided fine needle aspiration should be considered, as per the consensus statement: Management of Thyroid Nodules Detected at [place]: Society of Radiologists in [place]. Radiology 2005; [id number].  Electronically Signed   By: Markus Daft M.D.   On: 02/25/2015 16:21   US Thyroid Biopsy  03/10/2015   CLINICAL DATA:  44 year old female with a history of left-sided thyroid nodule. She has been referred for percutaneous biopsy.  EXAM: ULTRASOUND GUIDED NEEDLE ASPIRATE BIOPSY OF THE THYROID GLAND  COMPARISON:  02/25/2015  PROCEDURE: The procedure, risks, benefits, and alternatives were explained to the patient. Questions regarding the procedure were encouraged and answered. The patient understands and consents to the procedure.  Ultrasound survey was performed with images stored and sent to PACs.  The left neck was prepped with Betadine in a sterile fashion, and a sterile drape was applied covering the operative field. A sterile gown and sterile gloves were used for the procedure. Local anesthesia was provided with 1% Lidocaine.  Ultrasound guidance was used to infiltrate the region with 1% lidocaine for local anesthesia. Four separate 25 gauge fine needle biopsy were then acquired of the left thyroid nodule using ultrasound guidance. Images were stored.  Slide preparation was performed.  Final image was stored after biopsy.  Patient tolerated the procedure well and remained hemodynamically stable throughout.  No complications were encountered and no significant blood loss was encounter  FINDINGS: Ultrasound survey of the left thyroid demonstrates heterogeneous nodule inferiorly.  Images during the case demonstrate needle tip within the nodule on each needle pass.  Final ultrasound image demonstrates no complicating features.  IMPRESSION: Status post ultrasound-guided biopsy of left inferior thyroid nodule. Specimen sent to pathology for complete  histopathologic analysis.  Signed,  Dulcy Fanny. Earleen Newport, DO  [facility]  [facility]   Electronically Signed   By: Corrie Mckusick D.O.   On: 03/10/2015 14:44   Dg Chest Port 1 View  03/24/2015   CLINICAL DATA:  Shortness of breath.  TB.  EXAM: PORTABLE CHEST - 1 VIEW  COMPARISON:  03/19/2015  FINDINGS: Decreasing bilateral pleural effusions and atelectasis, left more than right. No evidence of edema, new pneumonia, or pneumothorax.  Normal heart size and stable mediastinal contours.  IMPRESSION: Decreased bilateral pleural effusion, now small volume.   Electronically Signed   By: Monte Fantasia M.D.   On: 03/24/2015 02:21   Dg Chest Port 1 View  03/20/2015   CLINICAL DATA:  Walking Corpse syndrome  EXAM: PORTABLE CHEST - 1 VIEW  COMPARISON:  03/17/2015  FINDINGS: The cardiac enlargement with pulmonary vascular congestion and perihilar edema, increasing since prior study. Small to moderate left pleural effusion is also increasing. No pneumothorax.  IMPRESSION: Increasing pulmonary vascular congestion, edema, and left pleural effusion since prior study.   Electronically Signed   By: Lucienne Capers M.D.   On: 03/20/2015 00:11   Dg Chest Port 1 View  03/14/2015   CLINICAL DATA:  Abnormal labs, suspicious for infection.  EXAM: PORTABLE CHEST - 1 VIEW  COMPARISON:  03/12/2015  FINDINGS: There is persistent right pleural fluid or thickening in the lateral base. Mild linear basilar opacities persist and may represent scarring. Lungs are otherwise clear. Hilar, mediastinal and cardiac contours are unremarkable and unchanged. Pulmonary vasculature is normal.  IMPRESSION: Unchanged, would minimal linear basilar opacities and right lateral pleural fluid/thickening.   Electronically Signed   By: Andreas Newport M.D.   On: 03/14/2015 03:09   Dg Knee Complete 4 Views Right  03/05/2015   CLINICAL DATA:  Acute onset of right knee pain and swelling. Initial encounter.  EXAM:  RIGHT KNEE - COMPLETE 4+ VIEW  COMPARISON:  None.  FINDINGS: There is no evidence of fracture or dislocation. The joint spaces are preserved. No significant degenerative change is seen; the patellofemoral joint is grossly unremarkable in appearance.  A small knee joint effusion is noted. The visualized soft tissues are otherwise unremarkable.  IMPRESSION: 1. No evidence of fracture or dislocation. 2. Small knee joint effusion noted.   Electronically Signed   By: Garald Balding M.D.   On: 03/05/2015 22:31   Dg Hand Complete Right  03/05/2015   CLINICAL DATA:  Bilateral hand pain and swelling. Initial encounter.  EXAM: RIGHT HAND - COMPLETE 3+ VIEW  COMPARISON:  None.  FINDINGS: There is no evidence of fracture or dislocation. The joint spaces are preserved. The carpal rows are intact, and demonstrate normal alignment. Mild dorsal soft tissue swelling is noted about the wrist.  IMPRESSION: No evidence of fracture or dislocation.   Electronically Signed   By: Garald Balding M.D.   On: 03/05/2015 22:30   Dg Knee Ap/lat W/sunrise Left  03/19/2015   CLINICAL DATA:  Pain and swelling.  No appreciable trauma  EXAM: LEFT KNEE 3 VIEWS  COMPARISON:  None.  FINDINGS: Frontal, lateral, and sunrise patellar images were obtained. There is no fracture or dislocation. Joint spaces appear intact. No effusion. No erosive change.  IMPRESSION: No fracture or effusion.  No appreciable arthropathy.   Electronically Signed   By: Lowella Grip III M.D.   On: 03/19/2015 13:55   Dg Knee Ap/lat W/sunrise Right  03/19/2015   CLINICAL DATA:  Pain and swelling.  No history of trauma  EXAM: RIGHT KNEE 3 VIEWS  COMPARISON:  March 05, 2015  FINDINGS: Frontal, lateral, and sunrise patellar images were obtained. No fracture or dislocation. No appreciable joint effusion. Joint spaces appear intact. No erosive change.  IMPRESSION: No fracture or effusion.  No appreciable arthropathy.   Electronically Signed   By: Lowella Grip III M.D.    On: 03/19/2015 13:57   US Thoracentesis Asp Pleural Space W/img Guide  03/17/2015   INDICATION: Symptomatic R sided pleural effusion  EXAM: US THORACENTESIS ASP PLEURAL SPACE W/IMG GUIDE  COMPARISON:  None.  MEDICATIONS: 10 cc 1% lidocaine  COMPLICATIONS: None immediate  TECHNIQUE: Informed written consent was obtained from the patient after a discussion of the risks, benefits and alternatives to treatment. A timeout was performed prior to the initiation of the procedure.  Initial ultrasound scanning demonstrates a right pleural effusion. The lower chest was prepped and draped in the usual sterile fashion. 1% lidocaine was used for local anesthesia.  Under direct ultrasound guidance, a 19 gauge, 7-cm, Yueh catheter was introduced. An ultrasound image was saved for documentation purposes. the thoracentesis was performed. The catheter was removed and a dressing was applied. The patient tolerated the procedure well without immediate post procedural complication. The patient was escorted to have an upright chest radiograph.  FINDINGS: A total of approximately 580 cc of yellow fluid was removed. Requested samples were sent to the laboratory.  IMPRESSION: Successful ultrasound-guided R sided thoracentesis yielding 580 cc of pleural fluid.  Read by:  Lavonia Drafts [facility]   Electronically Signed   By: Sandi Mariscal M.D.   On: 03/17/2015 15:13   US Thoracentesis Asp Pleural Space W/img Guide  03/03/2015   INDICATION: Dyspnea, bilateral pleural effusions; request is made for diagnostic and therapeutic right thoracentesis.  EXAM: ULTRASOUND GUIDED DIAGNOSTIC AND THERAPEUTIC RIGHT THORACENTESIS  COMPARISON:  None.  MEDICATIONS: None  COMPLICATIONS: None immediate  TECHNIQUE: Informed  written consent was obtained from the patient via interpreter after a discussion of the risks, benefits and alternatives to treatment. A timeout was performed prior to the initiation of the procedure.  Initial ultrasound scanning demonstrates a  pleural effusion. The lower chest was prepped and draped in the usual sterile fashion. 1% lidocaine was used for local anesthesia.  An ultrasound image was saved for documentation purposes. A 6 Fr Safe-T-Centesis catheter was introduced. The thoracentesis was performed. The catheter was removed and a dressing was applied. The patient tolerated the procedure well without immediate post procedural complication. The patient was escorted to have an upright chest radiograph.  FINDINGS: A total of approximately 360 cc's of slightly turbid ,yellow fluid was removed. Requested samples were sent to the laboratory.  IMPRESSION: Successful ultrasound-guided diagnostic and therapeutic right sided thoracentesis yielding 360 cc's of pleural fluid.  Read by: Rowe Robert, PA-C   Electronically Signed   By: Markus Daft M.D.   On: 03/03/2015 16:41    CBC  Recent Labs Lab 03/19/15 0612 03/20/15 0509 03/21/15 0540 03/23/15 0839 03/24/15 0516  WBC 4.2 4.7 3.8* 6.2 6.8  HGB 8.0* 8.2* 8.0* 9.5* 8.2*  HCT 24.5* 25.6* 25.1* 29.7* 25.6*  PLT 417* 395 425* 458* 530*  MCV 83.6 83.9 84.2 84.4 82.6  MCH 27.3 26.9 26.8 27.0 26.5  MCHC 32.7 32.0 31.9 32.0 32.0  RDW 13.8 14.0 14.0 14.3 14.4  LYMPHSABS  --   --   --  1.6  --   MONOABS  --   --   --  0.7  --   EOSABS  --   --   --  0.4  --   BASOSABS  --   --   --  0.1  --     Chemistries   Recent Labs Lab 03/19/15 0612 03/20/15 0509 03/21/15 0540 03/23/15 0839 03/24/15 0516  NA 136 138 138 138 135  K 3.7 3.6 3.4* 4.1 4.0  CL 106 106 105 105 101  CO2 21 23 23 20 27   GLUCOSE 124* 114* 95 168* 113*  BUN <5* <5* <5* <5* <5*  CREATININE 0.50 0.46* 0.54 0.62 0.54  CALCIUM 7.9* 8.4 7.9* 8.8 8.4  AST 24  --   --  23  --   ALT 14  --   --  7  --   ALKPHOS 63  --   --  68  --   BILITOT 0.3  --   --  0.7  --    ------------------------------------------------------------------------------------------------------------------ estimated creatinine clearance is 71.7  mL/min (by C-G formula based on Cr of 0.54). ------------------------------------------------------------------------------------------------------------------ No results for input(s): HGBA1C in the last 72 hours. ------------------------------------------------------------------------------------------------------------------ No results for input(s): CHOL, HDL, LDLCALC, TRIG, CHOLHDL, LDLDIRECT in the last 72 hours. ------------------------------------------------------------------------------------------------------------------ No results for input(s): TSH, T4TOTAL, T3FREE, THYROIDAB in the last 72 hours.  Invalid input(s): FREET3 ------------------------------------------------------------------------------------------------------------------ No results for input(s): VITAMINB12, FOLATE, FERRITIN, TIBC, IRON, RETICCTPCT in the last 72 hours.  Coagulation profile No results for input(s): INR, PROTIME in the last 168 hours.  No results for input(s): DDIMER in the last 72 hours.  Cardiac Enzymes No results for input(s): CKMB, TROPONINI, MYOGLOBIN in the last 168 hours.  Invalid input(s): CK ------------------------------------------------------------------------------------------------------------------ Invalid input(s): POCBNP    Ann-Marie Kluge D.O. on 03/24/2015 at 11:37 AM  Between 7am to 7pm - Pager - [phone]  After 7pm go to [email or web address] - password [email or web address]  And look for the night coverage person covering for me after hours  [place]  [phone]

## 2015-03-24 NOTE — Progress Notes (Signed)
[facility]    Subjective: Feels better today Antibiotics:  Anti-infectives    Start     Dose/Rate Route Frequency Ordered Stop   03/21/15 1300  pyrazinamide tablet 1,000 mg     1,000 mg Oral Daily 03/21/15 1200     03/21/15 1230  ethambutol (MYAMBUTOL) tablet 850 mg     15 mg/kg  57.2 kg Oral Daily 03/21/15 1200     03/21/15 1230  rifampin (RIFADIN) capsule 600 mg     600 mg Oral Daily 03/21/15 1200     03/21/15 1230  isoniazid (NYDRAZID) tablet 300 mg     300 mg Oral Daily 03/21/15 1200     03/14/15 1445  ceFAZolin (ANCEF) IVPB 2 g/50 mL premix     2 g 100 mL/hr over 30 Minutes Intravenous 3 times per day 03/14/15 1426     03/14/15 1400  piperacillin-tazobactam (ZOSYN) IVPB 3.375 g  Status:  Discontinued     3.375 g 12.5 mL/hr over 240 Minutes Intravenous 3 times per day 03/14/15 1319 03/14/15 1426   03/14/15 0830  piperacillin-tazobactam (ZOSYN) IVPB 3.375 g  Status:  Discontinued     3.375 g 12.5 mL/hr over 240 Minutes Intravenous Every 8 hours 03/14/15 0217 03/14/15 0651   03/14/15 0230  vancomycin (VANCOCIN) IVPB 750 mg/150 ml premix  Status:  Discontinued     750 mg 150 mL/hr over 60 Minutes Intravenous Every 12 hours 03/14/15 0217 03/16/15 1151   03/14/15 0215  piperacillin-tazobactam (ZOSYN) IVPB 3.375 g     3.375 g 100 mL/hr over 30 Minutes Intravenous  Once 03/14/15 0213 03/14/15 0323      Medications: Scheduled Meds: . antiseptic oral rinse  7 mL Mouth Rinse BID  .  ceFAZolin (ANCEF) IV  2 g Intravenous 3 times per day  . cyanocobalamin  1,000 mcg Intramuscular Once  . ethambutol  15 mg/kg Oral Daily  . feeding supplement (ENSURE)  1 Container Oral TID BM  . furosemide  20 mg Oral Daily  . isoniazid  300 mg Oral Daily  . megestrol  40 mg Oral Daily  . multivitamin with minerals  1 tablet Oral Daily  . pyrazinamide  1,000 mg Oral Daily  . vitamin B-6  50 mg Oral Daily  . rifampin  600 mg Oral Daily  . senna-docusate  1 tablet  Oral QHS   Continuous Infusions: . lactated ringers 10 mL/hr at 03/20/15 1629   PRN Meds:.ibuprofen, oxyCODONE    Objective: Weight change: 11.2 oz (0.318 kg)  Intake/Output Summary (Last 24 hours) at 03/24/15 1101 Last data filed at 03/23/15 1700  Gross per 24 hour  Intake    220 ml  Output      0 ml  Net    220 ml   Blood pressure 106/58, pulse 107, temperature 100 F (37.8 C), temperature source Oral, resp. rate 18, height 5\' 2"  (1.575 m), weight 123 lb 11.2 oz (56.11 kg), last menstrual period 11/19/2014, SpO2 96 %. Temp:  [98.2 F (36.8 C)-100.3 F (37.9 C)] 100 F (37.8 C) (04/12 0606) Pulse Rate:  [102-117] 107 (04/12 0606) Resp:  [18] 18 (04/11 1357) BP: (101-106)/(50-58) 106/58 mmHg (04/12 0606) SpO2:  [94 %-96 %] 96 % (04/12 0606) Weight:  [123 lb 11.2 oz (56.11 kg)] 123 lb 11.2 oz (56.11 kg) (04/12 0606)  Physical Exam: General: Alert and awake, oriented x3, feeling out of breath HEENT: anicteric sclera, pupils reactive to light and accommodation, EOMI CVS regular  rate, normal r,  no murmur rubs or gallops Chest: diminished breath sounds at the bases bilaterally  Abdomen: soft nontender, nondistended, normal bowel sounds, Extremities: less tenderness in bilateral knees and ankles, still warm but less tender Skin: no rashes Lymph: Right axillary lymph node surgical site is healing well Neuro: nonfocal  CBC: CBC Latest Ref Rng 03/24/2015 03/23/2015 03/21/2015  WBC 4.0 - 10.5 K/uL 6.8 6.2 3.8(L)  Hemoglobin 12.0 - 15.0 g/dL 8.2(L) 9.5(L) 8.0(L)  Hematocrit 36.0 - 46.0 % 25.6(L) 29.7(L) 25.1(L)  Platelets 150 - 400 K/uL 530(H) 458(H) 425(H)       BMET  Recent Labs  03/23/15 0839 03/24/15 0516  NA 138 135  K 4.1 4.0  CL 105 101  CO2 20 27  GLUCOSE 168* 113*  BUN <5* <5*  CREATININE 0.62 0.54  CALCIUM 8.8 8.4     Liver Panel   Recent Labs  03/23/15 0839  PROT 6.4  ALBUMIN 2.2*  AST 23  ALT 7  ALKPHOS 68  BILITOT 0.7        Sedimentation Rate No results for input(s): ESRSEDRATE in the last 72 hours. C-Reactive Protein No results for input(s): CRP in the last 72 hours.  Micro Results: Recent Results (from the past 720 hour(s))  Body fluid culture     Status: None   Collection Time: 03/03/15  4:41 PM  Result Value Ref Range Status   Specimen Description PLEURAL  Final   Special Requests PLEURAL  Final   Gram Stain   Final    RARE WBC PRESENT, PREDOMINANTLY PMN NO ORGANISMS SEEN Performed at [facility]    Culture   Final    NO GROWTH 3 DAYS Performed at [facility]    Report Status 03/07/2015 FINAL  Final  AFB culture with smear     Status: None (Preliminary result)   Collection Time: 03/03/15  4:41 PM  Result Value Ref Range Status   Specimen Description PLEURAL  Final   Special Requests NONE  Final   Acid Fast Smear   Final    NO ACID FAST BACILLI SEEN Performed at [facility]    Culture   Final    CULTURE WILL BE EXAMINED FOR 6 WEEKS BEFORE ISSUING A FINAL REPORT Performed at [facility]    Report Status PENDING  Incomplete  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 tuberculosis, Direct CANCELED      Comment: Specimen already disgarded from referring Accession. Will need to recollect patient.  Result canceled by the ancillary    Source (MTBPCR) CANCELED      Comment: Result canceled by the ancillary  Culture, blood (single)     Status: None   Collection Time: 03/12/15  1:25 PM  Result Value Ref Range Status   Culture STAPHYLOCOCCUS SPECIES (COAGULASE NEGATIVE)  Final   Organism ID, Bacteria STAPHYLOCOCCUS SPECIES (COAGULASE NEGATIVE)  Final    Comment: Rifampin and Gentamicin should not be used as single drugs for treatment of Staph infections. This organism DOES NOT demonstrate inducible Clindamycin resistance in vitro. Gram Stain Report Called to,Read  Back By and Verified With: DR. Reginia Forts 03/13/15 AT 2300 RIDK       Susceptibility   Staphylococcus species (coagulase negative) -  (no method available)    PENICILLIN 0.25 Resistant     OXACILLIN <=0.25 Sensitive  CEFAZOLIN  Sensitive     GENTAMICIN <=0.5 Sensitive     CIPROFLOXACIN <=0.5 Sensitive     LEVOFLOXACIN <=0.12 Sensitive     TRIMETH/SULFA <=10 Sensitive     VANCOMYCIN 1 Sensitive     CLINDAMYCIN <=0.25 Sensitive     ERYTHROMYCIN >=8 Resistant     RIFAMPIN <=0.5 Sensitive     TETRACYCLINE <=1 Sensitive   Culture, blood (single)     Status: None   Collection Time: 03/12/15  1:25 PM  Result Value Ref Range Status   Organism ID, Bacteria STAPHYLOCOCCUS SPECIES (COAGULASE NEGATIVE)  Final    Comment: Susceptibilities performed on previous culture within the last 5 days. Gram Stain Report Called to,Read Back By and Verified With: DR. Reginia Forts 03/13/15 AT 2230 [place]   Blood Culture (routine x 2)     Status: None   Collection Time: 03/14/15  2:39 AM  Result Value Ref Range Status   Specimen Description BLOOD RIGHT WRIST  Final   Special Requests BOTTLES DRAWN AEROBIC ONLY 4CC  Final   Culture   Final    NO GROWTH 5 DAYS Performed at [facility]    Report Status 03/20/2015 FINAL  Final  Blood Culture (routine x 2)     Status: None   Collection Time: 03/14/15  2:46 AM  Result Value Ref Range Status   Specimen Description BLOOD LEFT ARM  Final   Special Requests BOTTLES DRAWN AEROBIC AND ANAEROBIC 5CC  Final   Culture   Final    NO GROWTH 5 DAYS Performed at [facility]    Report Status 03/20/2015 FINAL  Final  Urine culture     Status: None   Collection Time: 03/14/15  8:43 AM  Result Value Ref Range Status   Specimen Description URINE, RANDOM  Final   Special Requests NONE  Final   Colony Count   Final    1,000 COLONIES/ML Performed at [facility]    Culture   Final    INSIGNIFICANT GROWTH Performed at [facility]    Report Status 03/16/2015 FINAL  Final  Culture, blood (routine x 2)     Status: None   Collection Time: 03/15/15  9:40 AM  Result Value Ref Range Status   Specimen Description BLOOD LEFT ARM  Final   Special Requests BOTTLES DRAWN AEROBIC ONLY 10 CC  Final   Culture   Final    NO GROWTH 5 DAYS Note: Culture results may be compromised due to an excessive volume of blood received in culture bottles. Performed at [facility]    Report Status 03/21/2015 FINAL  Final  Culture, blood (routine x 2)     Status: None   Collection Time: 03/15/15  9:45 AM  Result Value Ref Range Status   Specimen Description BLOOD LEFT WRIST  Final   Special Requests BOTTLES DRAWN AEROBIC ONLY 10 CC  Final   Culture   Final    NO GROWTH 5 DAYS Note: Culture results may be compromised due to an excessive volume of blood received in culture bottles. Performed at [facility]    Report Status 03/21/2015 FINAL  Final  Body fluid culture     Status: None   Collection Time: 03/17/15  2:27 PM  Result Value Ref Range Status   Specimen Description FLUID RIGHT PLEURAL  Final   Special Requests NONE  Final   Gram Stain   Final    RARE WBC PRESENT,BOTH PMN AND  MONONUCLEAR NO ORGANISMS SEEN Performed at [facility]    Culture   Final    NO GROWTH 3 DAYS Performed at [facility]    Report Status 03/21/2015 FINAL  Final  Fungus Culture with Smear     Status: None (Preliminary result)   Collection Time: 03/19/15 12:18 PM  Result Value Ref Range Status   Specimen Description FLUID SYNOVIAL RIGHT KNEE  Final   Special Requests Normal  Final   Fungal Smear   Final    NO YEAST OR FUNGAL ELEMENTS SEEN Performed at [facility]    Culture   Final    CULTURE IN PROGRESS FOR FOUR WEEKS Performed at [facility]    Report Status PENDING  Incomplete  Body fluid culture     Status: None   Collection Time: 03/19/15 12:19 PM  Result Value Ref Range Status    Specimen Description FLUID SYNOVIAL RIGHT KNEE  Final   Special Requests Normal  Final   Gram Stain   Final    MODERATE WBC PRESENT, PREDOMINANTLY PMN NO ORGANISMS SEEN Performed at [facility]    Culture   Final    NO GROWTH 3 DAYS Performed at [facility]    Report Status 03/22/2015 FINAL  Final  AFB culture with smear     Status: None (Preliminary result)   Collection Time: 03/19/15 12:19 PM  Result Value Ref Range Status   Specimen Description FLUID SYNOVIAL RIGHT KNEE  Final   Special Requests Normal  Final   Acid Fast Smear   Final    NO ACID FAST BACILLI SEEN Performed at [facility]    Culture   Final    CULTURE WILL BE EXAMINED FOR 6 WEEKS BEFORE ISSUING A FINAL REPORT Performed at [facility]    Report Status PENDING  Incomplete  Culture, blood (routine x 2)     Status: None (Preliminary result)   Collection Time: 03/20/15  5:00 AM  Result Value Ref Range Status   Specimen Description BLOOD LEFT ARM  Final   Special Requests BOTTLES DRAWN AEROBIC AND ANAEROBIC 5ML  Final   Culture   Final           BLOOD CULTURE RECEIVED NO GROWTH TO DATE CULTURE WILL BE HELD FOR 5 DAYS BEFORE ISSUING A FINAL NEGATIVE REPORT Performed at [facility]    Report Status PENDING  Incomplete  Culture, blood (routine x 2)     Status: None (Preliminary result)   Collection Time: 03/20/15  5:10 AM  Result Value Ref Range Status   Specimen Description BLOOD RIGHT ARM  Final   Special Requests BOTTLES DRAWN AEROBIC AND ANAEROBIC 5ML  Final   Culture   Final           BLOOD CULTURE RECEIVED NO GROWTH TO DATE CULTURE WILL BE HELD FOR 5 DAYS BEFORE ISSUING A FINAL NEGATIVE REPORT Performed at [facility]    Report Status PENDING  Incomplete  Tissue culture     Status: None   Collection Time: 03/20/15  5:42 PM  Result Value Ref Range Status   Specimen Description TISSUE LYMPH NODE  Final   Special Requests NONE  Final   Gram Stain    Final    FEW WBC PRESENT, PREDOMINANTLY MONONUCLEAR NO ORGANISMS SEEN Performed at [facility]    Culture   Final    NO GROWTH 3 DAYS Performed at [facility]  Report Status 03/24/2015 FINAL  Final  Fungus Culture with Smear     Status: None (Preliminary result)   Collection Time: 03/20/15  5:42 PM  Result Value Ref Range Status   Specimen Description TISSUE LYMPH NODE  Final   Special Requests NONE  Final   Fungal Smear   Final    NO YEAST OR FUNGAL ELEMENTS SEEN Performed at [facility]    Culture   Final    CULTURE IN PROGRESS FOR FOUR WEEKS Performed at [facility]    Report Status PENDING  Incomplete  AFB culture with smear     Status: None (Preliminary result)   Collection Time: 03/20/15  5:42 PM  Result Value Ref Range Status   Specimen Description TISSUE LYMPH NODE  Final   Special Requests NONE  Final   Acid Fast Smear   Final    NO ACID FAST BACILLI SEEN Performed at [facility]    Culture   Final    CULTURE WILL BE EXAMINED FOR 6 WEEKS BEFORE ISSUING A FINAL REPORT Performed at [facility]    Report Status PENDING  Incomplete    Studies/Results: Dg Chest Port 1 View  03/24/2015   CLINICAL DATA:  Shortness of breath.  TB.  EXAM: PORTABLE CHEST - 1 VIEW  COMPARISON:  03/19/2015  FINDINGS: Decreasing bilateral pleural effusions and atelectasis, left more than right. No evidence of edema, new pneumonia, or pneumothorax.  Normal heart size and stable mediastinal contours.  IMPRESSION: Decreased bilateral pleural effusion, now small volume.   Electronically Signed   By: Monte Fantasia M.D.   On: 03/24/2015 02:21      Assessment/Plan:  Principal Problem:   Coagulase negative Staphylococcus bacteremia Active Problems:   Goiter   Bilateral pleural effusion   Arthralgia   Amenorrhea   LAD (lymphadenopathy), axillary   Bilateral edema of lower extremity   Fever   Sepsis   Transaminasemia   Latent  tuberculosis by blood test   Bacteremia   Knee effusion   Pleural effusion   Tuberculosis   Elevated d-dimer   SOB (shortness of breath)   Effusion into joint   Extrapulmonary tuberculosis    Nichole Perry is a 44 y.o. female from [place] who began having fevers, chills and sweats a little over 3 weeks ago. She is also noted migratory joint swelling and pain.She  first began in both feet and both knees. The swelling and pain in her feet has improved but now she has swelling in her left wrist. She is also had shortness of breath and was found to have bilateral pleural effusions on chest x-ray and CT scan. There was some atelectasis in the bases but no underlying infiltrates were noted. She underwent thoracentesis revealing exudative effusions. Gram stain and AFB stain were negative. Routine cultures were negative and AFB cultures are negative to date.Her Quantiferon TB assay was also positive. Pleural fluid cytology showed reactive mesothelial cells. She also underwent a biopsy of her thyroid gland that was benign as well on the 22nd.   She was then seen by her primary care physician in follow-up and found to still be suffering from malaise and to be febrile. Blood cultures were obtained on03/31/2016 in both are growing Methicillin Sensitive regulations negative staphylococcal species.  She continued to have pain in both knees as well as her ankles. She has a dry nonproductive cough.  She has several nonspecific inflammatory markers are elevated including a sedimentation rate above 100.  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  If we move forward to this then the health department is to be contacted to they can arrange for her to receive therapy for extrapulmonary tuberculosis.  #3 exudative pleural effusions see prior discussions the adenosine deaminase or MTB PCR's ETC appear to have been CANCELLED yet again, reflecting problem with our lab provider interface. There were performed but cultures have been taken   #4  Inguinal LA: agree that excisional lymph node biopsy and pathology is pending    #5 Knee effusions, ankle effusions: these WERE indeed aspirated with WBC of 8k, no crystals and PMN predominance  Suspect also related to TB or whatever is the primary process driving her mx complaints.      LOS: 10 days   Alcide Evener 03/24/2015, 11:01 AM

## 2015-03-25 ENCOUNTER — Other Ambulatory Visit: Payer: 59

## 2015-03-25 LAB — CBC
HCT: 27.4 % — ABNORMAL LOW (ref 36.0–46.0)
Hemoglobin: 8.8 g/dL — ABNORMAL LOW (ref 12.0–15.0)
MCH: 26.5 pg (ref 26.0–34.0)
MCHC: 32.1 g/dL (ref 30.0–36.0)
MCV: 82.5 fL (ref 78.0–100.0)
PLATELETS: 540 10*3/uL — AB (ref 150–400)
RBC: 3.32 MIL/uL — ABNORMAL LOW (ref 3.87–5.11)
RDW: 14.7 % (ref 11.5–15.5)
WBC: 6.9 10*3/uL (ref 4.0–10.5)

## 2015-03-25 NOTE — Progress Notes (Signed)
[facility]                                                                              Patient Demographics  Nichole Perry, is a 44 y.o. female, DOB - [birth year], OAC:[id number]  Admit date - 03/14/2015   Admitting Physician Lavina Hamman, MD  Outpatient Primary MD for the patient is Maximino Greenland, MD  LOS - 11   Chief Complaint  Patient presents with  . Abnormal Lab      Brief history 44 year old female with a history of goiter and anemia presented to the hospital when she was called by her primary care provider with positive blood cultures. The patient has not been feeling well for nearly 3 weeks. The patient initially presented to her primary care provider because of bilateral knee pain and leg swelling on 02/21/2015. She was started on furosemide 20 mg daily. Chest x-ray at that time revealed small to moderate bilateral pleural effusions. CT angio of the chest was performed on 02/21/2015 which was negative for pulmonary embolus but revealed bilateral pleural effusions and central bronchial wall thickening and enlarged right axillary lymph node. She underwent thoracocentesis on 03/03/2015. Fluid chemistries revealed an exudative pleural effusion with WBC 3000 (64% monos, 28% PMN, 8% eosinophils). Routine Fluid culture and AFB culture are negative at this time. 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 The patient followed up on 03/07/2015, and an autoimmune workup was begun which was negative. Again, the patient represented to the primary care provider office on 03/12/2015 with 2 day history of fever up to 101.19F. Blood cultures were obtained at that time are growing CoNS. Notably, the patient also was noted to have a right thyroid nodule which has been biopsied on 03/10/2015. The biopsy revealed benign follicular nodule. Since admission, the patient has continued to have fevers. Workup continues in progress. Patient has had R knee aspiration, axillary lymph node biopsy.  No fungal or  yeast has grown out of any culture.  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   Assessment & Plan   Sepsis -Continues to be febrile over the past 24 hours, Tmax 101.2 -Secondary to bacteremia ? Vs TB -Lactic acid 2.22 on 03/14/2015 -Continue IV cefazolin -flu is neg -Given TB meds, will not use tylenol for fever. Motrin ordered.  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 -IVF DISCONTINUED (not sure why IVF were restarted overnight) -Repeat CXR on 03/23/2015: Decreased in B/L pleural effusion, now small volume  -Continue low dose lasix -Weaned off of nasal canula  Bacteremia-- Coag negative Staph  -unclear presently if true bacteremia vs contaminant as 4/2 repeat blood cultures remain negative to date (had not yet started antibiotics) -may be partly contributing to clinical picture, but doubt is the entire answer -ID following -Echocardiogram: EF 60%, no gross vegetation -follow surveillance blood cultures--neg to date -TEE cannot be done at  this time as patient is on airborne precautions -Continue cefazolin, needs 14 days total- She is on day 12/14 of IV ancef  Per  ID   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 -03/17/15 repeat thoracocentesis WBC 797 (75% lymphs), glucose 150-- MTB PCR, ADA, glucose, cytology/ culture show no growth -given pt's clinical and epidemiologic hx, will still need to r/o pulmonary TB in setting of lymphocytic effusion -airborne isolation--await sputum although pt is barely coughing and unable to induce sputum -Spoke with Pulm  regarding possible VATS (pleural bx)- however no longer available; Spoke with [facility], no longer done there either -General surgery consulted and appreciated, s/p Right axillary lymph node biopsy -Tissue sample: no AFB, no yeast or fungal elements -Spoke with ID (quantiferon + in March 2016)- TB treatment started 4/9- Rifampin, Isoniazid, Pyridoxine, ethambutol with Vitamin B6 -Patient will need to follow up with the Health Department, spoke with Dr. Patience Musca [phone]); CM will contact TB RN on day of discharge.   Transaminasemia -Resolved, secondary to sepsis -No abdominal pain, no vomiting -Hepatitis B surface antigen, hepatitis C antibody--negative -HIV antibody is negative  Arthralgias and myalgias/ Lower extremity and hand edema and pain -Autoimmune workup has been negative including C3, C4, anti-CCP, ANA, RF, ENA, anti-dsDNA, Anti-SmAb -Lower ext doppler negative for DVT -improving with standard antibiotics -Continues to have swelling in upper ext and knees- improved -Consulted orthopedics- conducted aspiration of right knee at bedside -Right knee fluid culture: no organisms seen; no yeast or fungal elements -knee pain seems have to have improved, but complains of pain and swelling in hands -PT consulted  Normocytic Anemia -Likely chronic. Hb upon admission 10, fluctuates Hb between 8-9 (drop possibly dilutional) -Anemia panel: Iron 13, Ferritin 190, Vit B12 239 -Patient given dose of ferahem and B12  Hypoalbuminemia/malnutrition -Nutrition consulted -Continue megace and nutritional supplements -Given dose of albumin, increased from 1.6 to 2.2 -Encouraged patient to eat  Constipation  -Improved, continue bowel regimen  Code Status: Full  Family Communication: Husband at bedside  Disposition Plan: Admitted.  Will continue to monitor as patient continues to spike fevers.     On 03/19/2015, Spoke with [facility] and [facility]- had no recommendations and did not feel  patient needed to transfer.   Time Spent in minutes   30 minutes  Procedures  Echocardiogram US Thoracentesis Right knee aspiration Right axillary lymph node biopsy  Consults   Pulmonology Interventional radiology Infectious disease Orthopedic surgery General surgery  DVT Prophylaxis  SCDs  Lab Results  Component Value Date   PLT 540* 03/25/2015    Medications  Scheduled Meds: . antiseptic oral rinse  7 mL Mouth Rinse BID  .  ceFAZolin (ANCEF) IV  2 g Intravenous 3 times per day  . cyanocobalamin  1,000 mcg Intramuscular Once  . ethambutol  15 mg/kg Oral Daily  . feeding supplement (ENSURE)  1 Container Oral TID BM  . furosemide  20 mg Oral Daily  . isoniazid  300 mg Oral Daily  . megestrol  40 mg Oral Daily  . multivitamin with minerals  1 tablet Oral Daily  . pyrazinamide  1,000 mg Oral Daily  . vitamin B-6  50 mg Oral Daily  . rifampin  600 mg Oral Daily  . senna-docusate  1 tablet Oral QHS   Continuous Infusions: . lactated ringers 10 mL/hr at 03/20/15 1629   PRN Meds:.ibuprofen, oxyCODONE  Antibiotics    Anti-infectives    Start     Dose/Rate Route Frequency Ordered Stop  03/21/15 1300  pyrazinamide tablet 1,000 mg     1,000 mg Oral Daily 03/21/15 1200     03/21/15 1230  ethambutol (MYAMBUTOL) tablet 850 mg     15 mg/kg  57.2 kg Oral Daily 03/21/15 1200     03/21/15 1230  rifampin (RIFADIN) capsule 600 mg     600 mg Oral Daily 03/21/15 1200     03/21/15 1230  isoniazid (NYDRAZID) tablet 300 mg     300 mg Oral Daily 03/21/15 1200     03/14/15 1445  ceFAZolin (ANCEF) IVPB 2 g/50 mL premix     2 g 100 mL/hr over 30 Minutes Intravenous 3 times per day 03/14/15 1426     03/14/15 1400  piperacillin-tazobactam (ZOSYN) IVPB 3.375 g  Status:  Discontinued     3.375 g 12.5 mL/hr over 240 Minutes Intravenous 3 times per day 03/14/15 1319 03/14/15 1426   03/14/15 0830  piperacillin-tazobactam (ZOSYN) IVPB 3.375 g  Status:  Discontinued     3.375 g 12.5  mL/hr over 240 Minutes Intravenous Every 8 hours 03/14/15 0217 03/14/15 0651   03/14/15 0230  vancomycin (VANCOCIN) IVPB 750 mg/150 ml premix  Status:  Discontinued     750 mg 150 mL/hr over 60 Minutes Intravenous Every 12 hours 03/14/15 0217 03/16/15 1151   03/14/15 0215  piperacillin-tazobactam (ZOSYN) IVPB 3.375 g     3.375 g 100 mL/hr over 30 Minutes Intravenous  Once 03/14/15 0213 03/14/15 0323        Subjective:   Nichole Perry seen and examined today.  Continues to spike fever,     Objective:   Filed Vitals:   03/25/15 0612 03/25/15 0800 03/25/15 0900 03/25/15 1000  BP: 109/63   92/58  Pulse: 127 112  91  Temp: 101.2 F (38.4 C) 100 F (37.8 C) 99.4 F (37.4 C)   TempSrc: Oral Oral Oral   Resp: 16     Height:      Weight: 56.473 kg (124 lb 8 oz)     SpO2: 97%       Wt Readings from Last 3 Encounters:  03/25/15 56.473 kg (124 lb 8 oz)  03/12/15 52.527 kg (115 lb 12.8 oz)  03/05/15 53.524 kg (118 lb)     Intake/Output Summary (Last 24 hours) at 03/25/15 1231 Last data filed at 03/25/15 0900  Gross per 24 hour  Intake    975 ml  Output      0 ml  Net    975 ml    Exam  General: Well developed, NAD  Cardiovascular: S1 S2 auscultated,RRR, no murmurs  Respiratory: Clear but slightly diminished at the bases  Abdomen: Soft, nontender, nondistended, + bowel sounds  Extremities:minimal B/L hand/wrist/ankle/foot edema  Neuro: AAOx3, nonfocal, able to move all extremities  Psych: Pleasant, appropriate mood and affect   Data Review   Micro Results Recent Results (from the past 240 hour(s))  Body fluid culture     Status: None   Collection Time: 03/17/15  2:27 PM  Result Value Ref Range Status   Specimen Description FLUID RIGHT PLEURAL  Final   Special Requests NONE  Final   Gram Stain   Final    RARE WBC PRESENT,BOTH PMN AND MONONUCLEAR NO ORGANISMS SEEN Performed at [facility]    Culture   Final    NO GROWTH 3 DAYS Performed at  [facility]    Report Status 03/21/2015 FINAL  Final  Fungus Culture with Smear  Status: None (Preliminary result)   Collection Time: 03/19/15 12:18 PM  Result Value Ref Range Status   Specimen Description FLUID SYNOVIAL RIGHT KNEE  Final   Special Requests Normal  Final   Fungal Smear   Final    NO YEAST OR FUNGAL ELEMENTS SEEN Performed at [facility]    Culture   Final    CULTURE IN PROGRESS FOR FOUR WEEKS Performed at [facility]    Report Status PENDING  Incomplete  Body fluid culture     Status: None   Collection Time: 03/19/15 12:19 PM  Result Value Ref Range Status   Specimen Description FLUID SYNOVIAL RIGHT KNEE  Final   Special Requests Normal  Final   Gram Stain   Final    MODERATE WBC PRESENT, PREDOMINANTLY PMN NO ORGANISMS SEEN Performed at [facility]    Culture   Final    NO GROWTH 3 DAYS Performed at [facility]    Report Status 03/22/2015 FINAL  Final  AFB culture with smear     Status: None (Preliminary result)   Collection Time: 03/19/15 12:19 PM  Result Value Ref Range Status   Specimen Description FLUID SYNOVIAL RIGHT KNEE  Final   Special Requests Normal  Final   Acid Fast Smear   Final    NO ACID FAST BACILLI SEEN Performed at [facility]    Culture   Final    CULTURE WILL BE EXAMINED FOR 6 WEEKS BEFORE ISSUING A FINAL REPORT Performed at [facility]    Report Status PENDING  Incomplete  Culture, blood (routine x 2)     Status: None (Preliminary result)   Collection Time: 03/20/15  5:00 AM  Result Value Ref Range Status   Specimen Description BLOOD LEFT ARM  Final   Special Requests BOTTLES DRAWN AEROBIC AND ANAEROBIC 5ML  Final   Culture   Final           BLOOD CULTURE RECEIVED NO GROWTH TO DATE CULTURE WILL BE HELD FOR 5 DAYS BEFORE ISSUING A FINAL NEGATIVE REPORT Performed at [facility]    Report Status PENDING  Incomplete  Culture, blood (routine x 2)      Status: None (Preliminary result)   Collection Time: 03/20/15  5:10 AM  Result Value Ref Range Status   Specimen Description BLOOD RIGHT ARM  Final   Special Requests BOTTLES DRAWN AEROBIC AND ANAEROBIC 5ML  Final   Culture   Final           BLOOD CULTURE RECEIVED NO GROWTH TO DATE CULTURE WILL BE HELD FOR 5 DAYS BEFORE ISSUING A FINAL NEGATIVE REPORT Performed at [facility]    Report Status PENDING  Incomplete  Tissue culture     Status: None   Collection Time: 03/20/15  5:42 PM  Result Value Ref Range Status   Specimen Description TISSUE LYMPH NODE  Final   Special Requests NONE  Final   Gram Stain   Final    FEW WBC PRESENT, PREDOMINANTLY MONONUCLEAR NO ORGANISMS SEEN Performed at [facility]    Culture   Final    NO GROWTH 3 DAYS Performed at [facility]    Report Status 03/24/2015 FINAL  Final  Fungus Culture with Smear     Status: None (Preliminary result)   Collection Time: 03/20/15  5:42 PM  Result Value Ref Range Status   Specimen Description TISSUE LYMPH NODE  Final   Special  Requests NONE  Final   Fungal Smear   Final    NO YEAST OR FUNGAL ELEMENTS SEEN Performed at [facility]    Culture   Final    CULTURE IN PROGRESS FOR FOUR WEEKS Performed at [facility]    Report Status PENDING  Incomplete  AFB culture with smear     Status: None (Preliminary result)   Collection Time: 03/20/15  5:42 PM  Result Value Ref Range Status   Specimen Description TISSUE LYMPH NODE  Final   Special Requests NONE  Final   Acid Fast Smear   Final    NO ACID FAST BACILLI SEEN Performed at [facility]    Culture   Final    CULTURE WILL BE EXAMINED FOR 6 WEEKS BEFORE ISSUING A FINAL REPORT Performed at [facility]    Report Status PENDING  Incomplete    Radiology Reports Dg Chest 1 View  03/17/2015   CLINICAL DATA:  Right sided thoracentesis, status post removal of 580 cc of yellow fluid. Cough.  EXAM: CHEST  1  VIEW  COMPARISON:  03/14/2015  FINDINGS: No pneumothorax. Mild atelectasis medially at the right lung base and the right perihilar region. Prior blunting of the right costophrenic angle resolved. There is a moderate left pleural effusion silhouetting the left heart border.  Borderline enlargement of the cardiopericardial silhouette.  IMPRESSION: 1. Resolution of prior right pleural effusion, with some residual medial right-sided atelectasis. Increase in left pleural effusion, currently moderate in size, with associated passive atelectasis.   Electronically Signed   By: Van Clines M.D.   On: 03/17/2015 14:56   Dg Chest 1 View  03/03/2015   CLINICAL DATA:  Post right thoracentesis  EXAM: CHEST  1 VIEW  COMPARISON:  Chest x-ray of 02/21/2015  FINDINGS: Some of the right pleural effusion has been evacuated. No obvious pneumothorax is seen, but on the frontal view there is a fluid level at the right lung base and a small right hydro pneumothorax is suspected. Opacity at the left lung base persists consistent with left effusion, atelectasis, and possibly pneumonia. Heart size is stable.  IMPRESSION: 1. Suspect small right hydro pneumothorax after right thoracentesis. 2. Reduction of right pleural effusion.   Electronically Signed   By: Ivar Drape M.D.   On: 03/03/2015 16:17   Dg Chest 2 View  03/13/2015   CLINICAL DATA:  Cough  EXAM: CHEST  2 VIEW  COMPARISON:  March 03, 2015 chest radiograph and chest CT February 21, 2015  FINDINGS: There is a small pleural effusion on the right. No pneumothorax appreciable. There is mild bibasilar atelectasis. Lungs are otherwise clear. Heart size and pulmonary vascularity are normal. No adenopathy. There is evidence of sclerosis in several ribs consistent with prior rib trauma. No acute fracture apparent. Nipple shadows are noted bilaterally.  IMPRESSION: Small right pleural effusion. Mild bibasilar atelectasis. No airspace consolidation or edema.   Electronically Signed    By: Lowella Grip III M.D.   On: 03/13/2015 09:20   Ct Angio Chest Pe W/cm &/or Wo Cm  03/20/2015   CLINICAL DATA:  Shortness of breath. Tachycardia. Elevated D-dimer.  EXAM: CT ANGIOGRAPHY CHEST WITH CONTRAST  TECHNIQUE: Multidetector CT imaging of the chest was performed using the standard protocol during bolus administration of intravenous contrast. Multiplanar CT image reconstructions and MIPs were obtained to evaluate the vascular anatomy.  CONTRAST:  60mL OMNIPAQUE IOHEXOL 350 MG/ML SOLN  COMPARISON:  02/21/2015  FINDINGS: Technically  adequate study with good opacification of the central and segmental pulmonary arteries. No focal filling defects. No evidence of significant pulmonary embolus.  Normal heart size. Normal caliber thoracic aorta without evidence of dissection. Great vessel origins are patent. Esophagus is decompressed. No significant lymphadenopathy in the mediastinum. Again demonstrated is lymphadenopathy in the right axilla with enlarged lymph nodes measuring up to about 15 mm short axis dimension. Large left thyroid gland nodule measuring up to 3.5 cm diameter. No change since prior study.  Moderate size bilateral pleural effusions with atelectasis or consolidation in both lung bases. This is similar to prior study. No pneumothorax.  Included portion of upper abdominal organs is unremarkable except for a small accessory spleen. Degenerative changes in the spine. No destructive bone lesions.  Review of the MIP images confirms the above findings.  IMPRESSION: No evidence of significant pulmonary embolus. Again demonstrated are moderate bilateral pleural effusions with atelectasis or consolidation in the lung bases. Axillary lymphadenopathy. Appearance is nonspecific and follow-up suggested. Left thyroid gland nodule. Follow-up ultrasound has been suggested.   Electronically Signed   By: Lucienne Capers M.D.   On: 03/20/2015 04:28   US Soft Tissue Head/neck  02/25/2015   CLINICAL DATA:   Thyroid nodule.  EXAM: THYROID ULTRASOUND  TECHNIQUE: Ultrasound examination of the thyroid gland and adjacent soft tissues was performed.  COMPARISON:  Chest CT 02/21/2015  FINDINGS: Right thyroid lobe  Measurements: 5.8 x 1.8 x 1.9 cm. There is a subtle heterogeneous nodule along the medial right thyroid lobe that measures 0.6 x 0.3 x 0.7 cm. There is a heterogeneous nodule in the inferior right thyroid lobe that measures 1.0 x 0.5 x 0.8 cm.  Left thyroid lobe  Measurements: 7.1 x 2.7 x 2.6 cm. There is a dominant nodule occupying the mid and lower aspect of the left thyroid lobe. This lesion corresponds with the nodule seen on the recent CT. This dominant left thyroid nodule measures 4.7 x 2.5 x 4.1 cm. Nodule is heterogeneous with small cystic areas. Cannot exclude small calcifications in this nodule.  Isthmus  Thickness: 0.4 cm.  No nodules visualized.  Lymphadenopathy  None visualized.  IMPRESSION: Bilateral thyroid nodules. There is a dominant left thyroid nodule that measures up to 4.7 cm. Findings meet consensus criteria for biopsy. Ultrasound-guided fine needle aspiration should be considered, as per the consensus statement: Management of Thyroid Nodules Detected at [place]: Society of Radiologists in [place]. Radiology 2005; [id number].   Electronically Signed   By: Markus Daft M.D.   On: 02/25/2015 16:21   US Thyroid Biopsy  03/10/2015   CLINICAL DATA:  44 year old female with a history of left-sided thyroid nodule. She has been referred for percutaneous biopsy.  EXAM: ULTRASOUND GUIDED NEEDLE ASPIRATE BIOPSY OF THE THYROID GLAND  COMPARISON:  02/25/2015  PROCEDURE: The procedure, risks, benefits, and alternatives were explained to the patient. Questions regarding the procedure were encouraged and answered. The patient understands and consents to the procedure.  Ultrasound survey was performed with images stored and sent to PACs.  The left neck was prepped with Betadine in  a sterile fashion, and a sterile drape was applied covering the operative field. A sterile gown and sterile gloves were used for the procedure. Local anesthesia was provided with 1% Lidocaine.  Ultrasound guidance was used to infiltrate the region with 1% lidocaine for local anesthesia. Four separate 25 gauge fine needle biopsy were then acquired of the left thyroid nodule using ultrasound guidance. Images were stored.  Slide preparation was performed.  Final image was stored after biopsy.  Patient tolerated the procedure well and remained hemodynamically stable throughout.  No complications were encountered and no significant blood loss was encounter  FINDINGS: Ultrasound survey of the left thyroid demonstrates heterogeneous nodule inferiorly.  Images during the case demonstrate needle tip within the nodule on each needle pass.  Final ultrasound image demonstrates no complicating features.  IMPRESSION: Status post ultrasound-guided biopsy of left inferior thyroid nodule. Specimen sent to pathology for complete histopathologic analysis.  Signed,  Dulcy Fanny. Earleen Newport, DO  [facility]  [facility]   Electronically Signed   By: Corrie Mckusick D.O.   On: 03/10/2015 14:44   Dg Chest Port 1 View  03/24/2015   CLINICAL DATA:  Shortness of breath.  TB.  EXAM: PORTABLE CHEST - 1 VIEW  COMPARISON:  03/19/2015  FINDINGS: Decreasing bilateral pleural effusions and atelectasis, left more than right. No evidence of edema, new pneumonia, or pneumothorax.  Normal heart size and stable mediastinal contours.  IMPRESSION: Decreased bilateral pleural effusion, now small volume.   Electronically Signed   By: Monte Fantasia M.D.   On: 03/24/2015 02:21   Dg Chest Port 1 View  03/20/2015   CLINICAL DATA:  Walking Corpse syndrome  EXAM: PORTABLE CHEST - 1 VIEW  COMPARISON:  03/17/2015  FINDINGS: The cardiac enlargement with pulmonary vascular congestion and perihilar edema, increasing since  prior study. Small to moderate left pleural effusion is also increasing. No pneumothorax.  IMPRESSION: Increasing pulmonary vascular congestion, edema, and left pleural effusion since prior study.   Electronically Signed   By: Lucienne Capers M.D.   On: 03/20/2015 00:11   Dg Chest Port 1 View  03/14/2015   CLINICAL DATA:  Abnormal labs, suspicious for infection.  EXAM: PORTABLE CHEST - 1 VIEW  COMPARISON:  03/12/2015  FINDINGS: There is persistent right pleural fluid or thickening in the lateral base. Mild linear basilar opacities persist and may represent scarring. Lungs are otherwise clear. Hilar, mediastinal and cardiac contours are unremarkable and unchanged. Pulmonary vasculature is normal.  IMPRESSION: Unchanged, would minimal linear basilar opacities and right lateral pleural fluid/thickening.   Electronically Signed   By: Andreas Newport M.D.   On: 03/14/2015 03:09   Dg Knee Complete 4 Views Right  03/05/2015   CLINICAL DATA:  Acute onset of right knee pain and swelling. Initial encounter.  EXAM: RIGHT KNEE - COMPLETE 4+ VIEW  COMPARISON:  None.  FINDINGS: There is no evidence of fracture or dislocation. The joint spaces are preserved. No significant degenerative change is seen; the patellofemoral joint is grossly unremarkable in appearance.  A small knee joint effusion is noted. The visualized soft tissues are otherwise unremarkable.  IMPRESSION: 1. No evidence of fracture or dislocation. 2. Small knee joint effusion noted.   Electronically Signed   By: Garald Balding M.D.   On: 03/05/2015 22:31   Dg Hand Complete Right  03/05/2015   CLINICAL DATA:  Bilateral hand pain and swelling. Initial encounter.  EXAM: RIGHT HAND - COMPLETE 3+ VIEW  COMPARISON:  None.  FINDINGS: There is no evidence of fracture or dislocation. The joint spaces are preserved. The carpal rows are intact, and demonstrate normal alignment. Mild dorsal soft tissue swelling is noted about the wrist.  IMPRESSION: No evidence of  fracture or dislocation.   Electronically Signed   By: Garald Balding M.D.   On: 03/05/2015 22:30   Dg Knee Ap/lat W/sunrise Left  03/19/2015  CLINICAL DATA:  Pain and swelling.  No appreciable trauma  EXAM: LEFT KNEE 3 VIEWS  COMPARISON:  None.  FINDINGS: Frontal, lateral, and sunrise patellar images were obtained. There is no fracture or dislocation. Joint spaces appear intact. No effusion. No erosive change.  IMPRESSION: No fracture or effusion.  No appreciable arthropathy.   Electronically Signed   By: Lowella Grip III M.D.   On: 03/19/2015 13:55   Dg Knee Ap/lat W/sunrise Right  03/19/2015   CLINICAL DATA:  Pain and swelling.  No history of trauma  EXAM: RIGHT KNEE 3 VIEWS  COMPARISON:  March 05, 2015  FINDINGS: Frontal, lateral, and sunrise patellar images were obtained. No fracture or dislocation. No appreciable joint effusion. Joint spaces appear intact. No erosive change.  IMPRESSION: No fracture or effusion.  No appreciable arthropathy.   Electronically Signed   By: Lowella Grip III M.D.   On: 03/19/2015 13:57   US Thoracentesis Asp Pleural Space W/img Guide  03/17/2015   INDICATION: Symptomatic R sided pleural effusion  EXAM: US THORACENTESIS ASP PLEURAL SPACE W/IMG GUIDE  COMPARISON:  None.  MEDICATIONS: 10 cc 1% lidocaine  COMPLICATIONS: None immediate  TECHNIQUE: Informed written consent was obtained from the patient after a discussion of the risks, benefits and alternatives to treatment. A timeout was performed prior to the initiation of the procedure.  Initial ultrasound scanning demonstrates a right pleural effusion. The lower chest was prepped and draped in the usual sterile fashion. 1% lidocaine was used for local anesthesia.  Under direct ultrasound guidance, a 19 gauge, 7-cm, Yueh catheter was introduced. An ultrasound image was saved for documentation purposes. the thoracentesis was performed. The catheter was removed and a dressing was applied. The patient tolerated the  procedure well without immediate post procedural complication. The patient was escorted to have an upright chest radiograph.  FINDINGS: A total of approximately 580 cc of yellow fluid was removed. Requested samples were sent to the laboratory.  IMPRESSION: Successful ultrasound-guided R sided thoracentesis yielding 580 cc of pleural fluid.  Read by:  Lavonia Drafts [facility]   Electronically Signed   By: Sandi Mariscal M.D.   On: 03/17/2015 15:13   US Thoracentesis Asp Pleural Space W/img Guide  03/03/2015   INDICATION: Dyspnea, bilateral pleural effusions; request is made for diagnostic and therapeutic right thoracentesis.  EXAM: ULTRASOUND GUIDED DIAGNOSTIC AND THERAPEUTIC RIGHT THORACENTESIS  COMPARISON:  None.  MEDICATIONS: None  COMPLICATIONS: None immediate  TECHNIQUE: Informed written consent was obtained from the patient via interpreter after a discussion of the risks, benefits and alternatives to treatment. A timeout was performed prior to the initiation of the procedure.  Initial ultrasound scanning demonstrates a pleural effusion. The lower chest was prepped and draped in the usual sterile fashion. 1% lidocaine was used for local anesthesia.  An ultrasound image was saved for documentation purposes. A 6 Fr Safe-T-Centesis catheter was introduced. The thoracentesis was performed. The catheter was removed and a dressing was applied. The patient tolerated the procedure well without immediate post procedural complication. The patient was escorted to have an upright chest radiograph.  FINDINGS: A total of approximately 360 cc's of slightly turbid ,yellow fluid was removed. Requested samples were sent to the laboratory.  IMPRESSION: Successful ultrasound-guided diagnostic and therapeutic right sided thoracentesis yielding 360 cc's of pleural fluid.  Read by: Rowe Robert, PA-C   Electronically Signed   By: Markus Daft M.D.   On: 03/03/2015 16:41    CBC  Recent Labs Lab 03/20/15  1914 03/21/15 0540  03/23/15 0839 03/24/15 0516 03/25/15 0520  WBC 4.7 3.8* 6.2 6.8 6.9  HGB 8.2* 8.0* 9.5* 8.2* 8.8*  HCT 25.6* 25.1* 29.7* 25.6* 27.4*  PLT 395 425* 458* 530* 540*  MCV 83.9 84.2 84.4 82.6 82.5  MCH 26.9 26.8 27.0 26.5 26.5  MCHC 32.0 31.9 32.0 32.0 32.1  RDW 14.0 14.0 14.3 14.4 14.7  LYMPHSABS  --   --  1.6  --   --   MONOABS  --   --  0.7  --   --   EOSABS  --   --  0.4  --   --   BASOSABS  --   --  0.1  --   --     Chemistries   Recent Labs Lab 03/19/15 0612 03/20/15 0509 03/21/15 0540 03/23/15 0839 03/24/15 0516  NA 136 138 138 138 135  K 3.7 3.6 3.4* 4.1 4.0  CL 106 106 105 105 101  CO2 21 23 23 20 27   GLUCOSE 124* 114* 95 168* 113*  BUN <5* <5* <5* <5* <5*  CREATININE 0.50 0.46* 0.54 0.62 0.54  CALCIUM 7.9* 8.4 7.9* 8.8 8.4  AST 24  --   --  23  --   ALT 14  --   --  7  --   ALKPHOS 63  --   --  68  --   BILITOT 0.3  --   --  0.7  --    ------------------------------------------------------------------------------------------------------------------ estimated creatinine clearance is 71.7 mL/min (by C-G formula based on Cr of 0.54). ------------------------------------------------------------------------------------------------------------------ No results for input(s): HGBA1C in the last 72 hours. ------------------------------------------------------------------------------------------------------------------ No results for input(s): CHOL, HDL, LDLCALC, TRIG, CHOLHDL, LDLDIRECT in the last 72 hours. ------------------------------------------------------------------------------------------------------------------ No results for input(s): TSH, T4TOTAL, T3FREE, THYROIDAB in the last 72 hours.  Invalid input(s): FREET3 ------------------------------------------------------------------------------------------------------------------ No results for input(s): VITAMINB12, FOLATE, FERRITIN, TIBC, IRON, RETICCTPCT in the last 72 hours.  Coagulation profile No results  for input(s): INR, PROTIME in the last 168 hours.  No results for input(s): DDIMER in the last 72 hours.  Cardiac Enzymes No results for input(s): CKMB, TROPONINI, MYOGLOBIN in the last 168 hours.  Invalid input(s): CK ------------------------------------------------------------------------------------------------------------------ Invalid input(s): POCBNP    Nevaan Bunton D.O. on 03/25/2015 at 12:31 PM  Between 7am to 7pm - Pager - [phone]  After 7pm go to [email or web address] - password [email or web address]  And look for the night coverage person covering for me after hours  [facility] Office  [phone]

## 2015-03-25 NOTE — Progress Notes (Addendum)
[facility] for Infectious Disease    Subjective: Feels better today, still some pain in her ankles knees with walking. Antibiotics:  Anti-infectives    Start     Dose/Rate Route Frequency Ordered Stop   03/21/15 1300  pyrazinamide tablet 1,000 mg     1,000 mg Oral Daily 03/21/15 1200     03/21/15 1230  ethambutol (MYAMBUTOL) tablet 850 mg     15 mg/kg  57.2 kg Oral Daily 03/21/15 1200     03/21/15 1230  rifampin (RIFADIN) capsule 600 mg     600 mg Oral Daily 03/21/15 1200     03/21/15 1230  isoniazid (NYDRAZID) tablet 300 mg     300 mg Oral Daily 03/21/15 1200     03/14/15 1445  ceFAZolin (ANCEF) IVPB 2 g/50 mL premix     2 g 100 mL/hr over 30 Minutes Intravenous 3 times per day 03/14/15 1426     03/14/15 1400  piperacillin-tazobactam (ZOSYN) IVPB 3.375 g  Status:  Discontinued     3.375 g 12.5 mL/hr over 240 Minutes Intravenous 3 times per day 03/14/15 1319 03/14/15 1426   03/14/15 0830  piperacillin-tazobactam (ZOSYN) IVPB 3.375 g  Status:  Discontinued     3.375 g 12.5 mL/hr over 240 Minutes Intravenous Every 8 hours 03/14/15 0217 03/14/15 0651   03/14/15 0230  vancomycin (VANCOCIN) IVPB 750 mg/150 ml premix  Status:  Discontinued     750 mg 150 mL/hr over 60 Minutes Intravenous Every 12 hours 03/14/15 0217 03/16/15 1151   03/14/15 0215  piperacillin-tazobactam (ZOSYN) IVPB 3.375 g     3.375 g 100 mL/hr over 30 Minutes Intravenous  Once 03/14/15 0213 03/14/15 0323      Medications: Scheduled Meds: . antiseptic oral rinse  7 mL Mouth Rinse BID  .  ceFAZolin (ANCEF) IV  2 g Intravenous 3 times per day  . cyanocobalamin  1,000 mcg Intramuscular Once  . ethambutol  15 mg/kg Oral Daily  . feeding supplement (ENSURE)  1 Container Oral TID BM  . furosemide  20 mg Oral Daily  . isoniazid  300 mg Oral Daily  . megestrol  40 mg Oral Daily  . multivitamin with minerals  1 tablet Oral Daily  . pyrazinamide  1,000 mg Oral Daily  . vitamin B-6  50 mg Oral Daily  .  rifampin  600 mg Oral Daily  . senna-docusate  1 tablet Oral QHS   Continuous Infusions: . lactated ringers 10 mL/hr at 03/20/15 1629   PRN Meds:.ibuprofen, oxyCODONE    Objective: Weight change: 12.8 oz (0.363 kg)  Intake/Output Summary (Last 24 hours) at 03/25/15 1700 Last data filed at 03/25/15 1521  Gross per 24 hour  Intake   1115 ml  Output      0 ml  Net   1115 ml   Blood pressure 100/63, pulse 97, temperature 99.6 F (37.6 C), temperature source Oral, resp. rate 18, height 5\' 2"  (1.575 m), weight 124 lb 8 oz (56.473 kg), last menstrual period 11/19/2014, SpO2 98 %. Temp:  [99.4 F (37.4 C)-101.2 F (38.4 C)] 99.6 F (37.6 C) (04/13 1519) Pulse Rate:  [91-127] 97 (04/13 1519) Resp:  [16-18] 18 (04/13 1519) BP: (92-109)/(57-63) 100/63 mmHg (04/13 1519) SpO2:  [97 %-98 %] 98 % (04/13 1519) Weight:  [124 lb 8 oz (56.473 kg)] 124 lb 8 oz (56.473 kg) (04/13 0612)  Physical Exam: General: Alert and awake, oriented x3, feeling out of breath HEENT: anicteric sclera, pupils reactive  to light and accommodation, EOMI CVS regular rate, normal r,  no murmur rubs or gallops Chest: diminished breath sounds at the bases bilaterally  Abdomen: soft nontender, nondistended, normal bowel sounds, Extremities: less tenderness in bilateral knees and ankles, still warm but less tender Skin: no rashes Lymph: Right axillary lymph node surgical site is healing well Neuro: nonfocal  CBC: CBC Latest Ref Rng 03/25/2015 03/24/2015 03/23/2015  WBC 4.0 - 10.5 K/uL 6.9 6.8 6.2  Hemoglobin 12.0 - 15.0 g/dL 8.8(L) 8.2(L) 9.5(L)  Hematocrit 36.0 - 46.0 % 27.4(L) 25.6(L) 29.7(L)  Platelets 150 - 400 K/uL 540(H) 530(H) 458(H)       BMET  Recent Labs  03/23/15 0839 03/24/15 0516  NA 138 135  K 4.1 4.0  CL 105 101  CO2 20 27  GLUCOSE 168* 113*  BUN <5* <5*  CREATININE 0.62 0.54  CALCIUM 8.8 8.4     Liver Panel   Recent Labs  03/23/15 0839  PROT 6.4  ALBUMIN 2.2*  AST 23    ALT 7  ALKPHOS 68  BILITOT 0.7       Sedimentation Rate No results for input(s): ESRSEDRATE in the last 72 hours. C-Reactive Protein No results for input(s): CRP in the last 72 hours.  Micro Results: Recent Results (from the past 720 hour(s))  Body fluid culture     Status: None   Collection Time: 03/03/15  4:41 PM  Result Value Ref Range Status   Specimen Description PLEURAL  Final   Special Requests PLEURAL  Final   Gram Stain   Final    RARE WBC PRESENT, PREDOMINANTLY PMN NO ORGANISMS SEEN Performed at [facility]    Culture   Final    NO GROWTH 3 DAYS Performed at [facility]    Report Status 03/07/2015 FINAL  Final  AFB culture with smear     Status: None (Preliminary result)   Collection Time: 03/03/15  4:41 PM  Result Value Ref Range Status   Specimen Description PLEURAL  Final   Special Requests NONE  Final   Acid Fast Smear   Final    NO ACID FAST BACILLI SEEN Performed at [facility]    Culture   Final    CULTURE WILL BE EXAMINED FOR 6 WEEKS BEFORE ISSUING A FINAL REPORT Performed at [facility]    Report Status PENDING  Incomplete  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 tuberculosis, Direct CANCELED      Comment: Specimen already disgarded from referring Accession. Will need to recollect patient.  Result canceled by the ancillary    Source (MTBPCR) CANCELED      Comment: Result canceled by the ancillary  Culture, blood (single)     Status: None   Collection Time: 03/12/15  1:25 PM  Result Value Ref Range Status   Culture STAPHYLOCOCCUS SPECIES (COAGULASE NEGATIVE)  Final   Organism ID, Bacteria STAPHYLOCOCCUS SPECIES (COAGULASE NEGATIVE)  Final    Comment: Rifampin and Gentamicin should not be used as single drugs for treatment of Staph infections. This organism DOES NOT demonstrate inducible Clindamycin resistance in  vitro. Gram Stain Report Called to,Read Back By and Verified With: DR. Reginia Forts 03/13/15 AT 2300 RIDK       Susceptibility   Staphylococcus species (coagulase negative) -  (no method available)    PENICILLIN 0.25 Resistant  OXACILLIN <=0.25 Sensitive     CEFAZOLIN  Sensitive     GENTAMICIN <=0.5 Sensitive     CIPROFLOXACIN <=0.5 Sensitive     LEVOFLOXACIN <=0.12 Sensitive     TRIMETH/SULFA <=10 Sensitive     VANCOMYCIN 1 Sensitive     CLINDAMYCIN <=0.25 Sensitive     ERYTHROMYCIN >=8 Resistant     RIFAMPIN <=0.5 Sensitive     TETRACYCLINE <=1 Sensitive   Culture, blood (single)     Status: None   Collection Time: 03/12/15  1:25 PM  Result Value Ref Range Status   Organism ID, Bacteria STAPHYLOCOCCUS SPECIES (COAGULASE NEGATIVE)  Final    Comment: Susceptibilities performed on previous culture within the last 5 days. Gram Stain Report Called to,Read Back By and Verified With: DR. Reginia Forts 03/13/15 AT 2230 [place]   Blood Culture (routine x 2)     Status: None   Collection Time: 03/14/15  2:39 AM  Result Value Ref Range Status   Specimen Description BLOOD RIGHT WRIST  Final   Special Requests BOTTLES DRAWN AEROBIC ONLY 4CC  Final   Culture   Final    NO GROWTH 5 DAYS Performed at [facility]    Report Status 03/20/2015 FINAL  Final  Blood Culture (routine x 2)     Status: None   Collection Time: 03/14/15  2:46 AM  Result Value Ref Range Status   Specimen Description BLOOD LEFT ARM  Final   Special Requests BOTTLES DRAWN AEROBIC AND ANAEROBIC 5CC  Final   Culture   Final    NO GROWTH 5 DAYS Performed at [facility]    Report Status 03/20/2015 FINAL  Final  Urine culture     Status: None   Collection Time: 03/14/15  8:43 AM  Result Value Ref Range Status   Specimen Description URINE, RANDOM  Final   Special Requests NONE  Final   Colony Count   Final    1,000 COLONIES/ML Performed at [facility]    Culture   Final     INSIGNIFICANT GROWTH Performed at [facility]    Report Status 03/16/2015 FINAL  Final  Culture, blood (routine x 2)     Status: None   Collection Time: 03/15/15  9:40 AM  Result Value Ref Range Status   Specimen Description BLOOD LEFT ARM  Final   Special Requests BOTTLES DRAWN AEROBIC ONLY 10 CC  Final   Culture   Final    NO GROWTH 5 DAYS Note: Culture results may be compromised due to an excessive volume of blood received in culture bottles. Performed at [facility]    Report Status 03/21/2015 FINAL  Final  Culture, blood (routine x 2)     Status: None   Collection Time: 03/15/15  9:45 AM  Result Value Ref Range Status   Specimen Description BLOOD LEFT WRIST  Final   Special Requests BOTTLES DRAWN AEROBIC ONLY 10 CC  Final   Culture   Final    NO GROWTH 5 DAYS Note: Culture results may be compromised due to an excessive volume of blood received in culture bottles. Performed at [facility]    Report Status 03/21/2015 FINAL  Final  Body fluid culture     Status: None   Collection Time: 03/17/15  2:27 PM  Result Value Ref Range Status   Specimen Description FLUID RIGHT PLEURAL  Final   Special Requests NONE  Final   Gram Stain   Final  RARE WBC PRESENT,BOTH PMN AND MONONUCLEAR NO ORGANISMS SEEN Performed at [facility]    Culture   Final    NO GROWTH 3 DAYS Performed at [facility]    Report Status 03/21/2015 FINAL  Final  Fungus Culture with Smear     Status: None (Preliminary result)   Collection Time: 03/19/15 12:18 PM  Result Value Ref Range Status   Specimen Description FLUID SYNOVIAL RIGHT KNEE  Final   Special Requests Normal  Final   Fungal Smear   Final    NO YEAST OR FUNGAL ELEMENTS SEEN Performed at [facility]    Culture   Final    CULTURE IN PROGRESS FOR FOUR WEEKS Performed at [facility]    Report Status PENDING  Incomplete  Body fluid culture     Status: None   Collection Time:  03/19/15 12:19 PM  Result Value Ref Range Status   Specimen Description FLUID SYNOVIAL RIGHT KNEE  Final   Special Requests Normal  Final   Gram Stain   Final    MODERATE WBC PRESENT, PREDOMINANTLY PMN NO ORGANISMS SEEN Performed at [facility]    Culture   Final    NO GROWTH 3 DAYS Performed at [facility]    Report Status 03/22/2015 FINAL  Final  AFB culture with smear     Status: None (Preliminary result)   Collection Time: 03/19/15 12:19 PM  Result Value Ref Range Status   Specimen Description FLUID SYNOVIAL RIGHT KNEE  Final   Special Requests Normal  Final   Acid Fast Smear   Final    NO ACID FAST BACILLI SEEN Performed at [facility]    Culture   Final    CULTURE WILL BE EXAMINED FOR 6 WEEKS BEFORE ISSUING A FINAL REPORT Performed at [facility]    Report Status PENDING  Incomplete  Culture, blood (routine x 2)     Status: None (Preliminary result)   Collection Time: 03/20/15  5:00 AM  Result Value Ref Range Status   Specimen Description BLOOD LEFT ARM  Final   Special Requests BOTTLES DRAWN AEROBIC AND ANAEROBIC 5ML  Final   Culture   Final           BLOOD CULTURE RECEIVED NO GROWTH TO DATE CULTURE WILL BE HELD FOR 5 DAYS BEFORE ISSUING A FINAL NEGATIVE REPORT Performed at [facility]    Report Status PENDING  Incomplete  Culture, blood (routine x 2)     Status: None (Preliminary result)   Collection Time: 03/20/15  5:10 AM  Result Value Ref Range Status   Specimen Description BLOOD RIGHT ARM  Final   Special Requests BOTTLES DRAWN AEROBIC AND ANAEROBIC 5ML  Final   Culture   Final           BLOOD CULTURE RECEIVED NO GROWTH TO DATE CULTURE WILL BE HELD FOR 5 DAYS BEFORE ISSUING A FINAL NEGATIVE REPORT Performed at [facility]    Report Status PENDING  Incomplete  Tissue culture     Status: None   Collection Time: 03/20/15  5:42 PM  Result Value Ref Range Status   Specimen Description TISSUE LYMPH NODE   Final   Special Requests NONE  Final   Gram Stain   Final    FEW WBC PRESENT, PREDOMINANTLY MONONUCLEAR NO ORGANISMS SEEN Performed at [facility]    Culture   Final    NO GROWTH 3 DAYS Performed  at [facility]    Report Status 03/24/2015 FINAL  Final  Fungus Culture with Smear     Status: None (Preliminary result)   Collection Time: 03/20/15  5:42 PM  Result Value Ref Range Status   Specimen Description TISSUE LYMPH NODE  Final   Special Requests NONE  Final   Fungal Smear   Final    NO YEAST OR FUNGAL ELEMENTS SEEN Performed at [facility]    Culture   Final    CULTURE IN PROGRESS FOR FOUR WEEKS Performed at [facility]    Report Status PENDING  Incomplete  AFB culture with smear     Status: None (Preliminary result)   Collection Time: 03/20/15  5:42 PM  Result Value Ref Range Status   Specimen Description TISSUE LYMPH NODE  Final   Special Requests NONE  Final   Acid Fast Smear   Final    NO ACID FAST BACILLI SEEN Performed at [facility]    Culture   Final    CULTURE WILL BE EXAMINED FOR 6 WEEKS BEFORE ISSUING A FINAL REPORT Performed at [facility]    Report Status PENDING  Incomplete    Studies/Results: Dg Chest Port 1 View  03/24/2015   CLINICAL DATA:  Shortness of breath.  TB.  EXAM: PORTABLE CHEST - 1 VIEW  COMPARISON:  03/19/2015  FINDINGS: Decreasing bilateral pleural effusions and atelectasis, left more than right. No evidence of edema, new pneumonia, or pneumothorax.  Normal heart size and stable mediastinal contours.  IMPRESSION: Decreased bilateral pleural effusion, now small volume.   Electronically Signed   By: Monte Fantasia M.D.   On: 03/24/2015 02:21      Assessment/Plan:  Principal Problem:   Coagulase negative Staphylococcus bacteremia Active Problems:   Goiter   Bilateral pleural effusion   Arthralgia   Amenorrhea   LAD (lymphadenopathy), axillary   Bilateral edema of lower  extremity   Fever   Sepsis   Transaminasemia   Latent tuberculosis by blood test   Bacteremia   Knee effusion   Pleural effusion   Tuberculosis   Elevated d-dimer   SOB (shortness of breath)   Effusion into joint   Extrapulmonary tuberculosis    Nichole Perry is a 44 y.o. female from [place] who began having fevers, chills and sweats a little over 3 weeks ago. She is also noted migratory joint swelling and pain.She  first began in both feet and both knees. The swelling and pain in her feet has improved but now she has swelling in her left wrist. She is also had shortness of breath and was found to have bilateral pleural effusions on chest x-ray and CT scan. There was some atelectasis in the bases but no underlying infiltrates were noted. She underwent thoracentesis revealing exudative effusions. Gram stain and AFB stain were negative. Routine cultures were negative and AFB cultures are negative to date.Her Quantiferon TB assay was also positive. Pleural fluid cytology showed reactive mesothelial cells. She also underwent a biopsy of her thyroid gland that was benign as well on the 22nd.   She was then seen by her primary care physician in follow-up and found to still be suffering from malaise and to be febrile. Blood cultures were obtained on03/31/2016 in both are growing Methicillin Sensitive regulations negative staphylococcal species.  She continued to have pain in both knees as well as her ankles. She has a dry nonproductive cough.  She has several nonspecific inflammatory markers are  elevated including a sedimentation rate above 100. 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  If we move forward to this then the health department is to be contacted to they can arrange for her to receive therapy for extrapulmonary tuberculosis.  #3 exudative pleural effusions see prior discussions the adenosine deaminase or MTB PCR's ETC appear to have been CANCELLED yet again, reflecting problem with our lab provider interface. There were performed but cultures have been taken   #4  Inguinal LA: agree that excisional lymph node biopsy and pathology is pending    #5 Knee effusions, ankle effusions: these WERE indeed aspirated with WBC of 8k, no crystals and PMN predominance  Suspect also related to TB or whatever is the primary process driving her mx complaints.  #6 ? Hepatitis B: her surface antigen is negative      LOS: 11 days   Alcide Evener 03/25/2015, 5:00 PM

## 2015-03-26 ENCOUNTER — Encounter (HOSPITAL_COMMUNITY): Payer: Self-pay | Admitting: Infectious Disease

## 2015-03-26 DIAGNOSIS — I881 Chronic lymphadenitis, except mesenteric: Secondary | ICD-10-CM

## 2015-03-26 DIAGNOSIS — I888 Other nonspecific lymphadenitis: Secondary | ICD-10-CM

## 2015-03-26 HISTORY — DX: Other nonspecific lymphadenitis: I88.8

## 2015-03-26 LAB — COMPREHENSIVE METABOLIC PANEL
ALK PHOS: 68 U/L (ref 39–117)
ALT: 7 U/L (ref 0–35)
AST: 16 U/L (ref 0–37)
Albumin: 2.2 g/dL — ABNORMAL LOW (ref 3.5–5.2)
Anion gap: 13 (ref 5–15)
BUN: 8 mg/dL (ref 6–23)
CHLORIDE: 100 mmol/L (ref 96–112)
CO2: 21 mmol/L (ref 19–32)
CREATININE: 0.53 mg/dL (ref 0.50–1.10)
Calcium: 8.6 mg/dL (ref 8.4–10.5)
GFR calc non Af Amer: >90 mL/min (ref >90)
GLUCOSE: 116 mg/dL — AB (ref 70–99)
Potassium: 4.6 mmol/L (ref 3.5–5.1)
Sodium: 134 mmol/L — ABNORMAL LOW (ref 135–145)
Total Bilirubin: 0.6 mg/dL (ref 0.3–1.2)
Total Protein: 6.6 g/dL (ref 6.0–8.3)

## 2015-03-26 LAB — CULTURE, BLOOD (ROUTINE X 2)
Culture: NO GROWTH
Culture: NO GROWTH

## 2015-03-26 LAB — CBC
HCT: 26.7 % — ABNORMAL LOW (ref 36.0–46.0)
HEMOGLOBIN: 8.5 g/dL — AB (ref 12.0–15.0)
MCH: 26.2 pg (ref 26.0–34.0)
MCHC: 31.8 g/dL (ref 30.0–36.0)
MCV: 82.4 fL (ref 78.0–100.0)
Platelets: 532 10*3/uL — ABNORMAL HIGH (ref 150–400)
RBC: 3.24 MIL/uL — AB (ref 3.87–5.11)
RDW: 14.9 % (ref 11.5–15.5)
WBC: 7.2 10*3/uL (ref 4.0–10.5)

## 2015-03-26 MED ORDER — PYRIDOXINE HCL 50 MG PO TABS: 50.0000 mg | ORAL_TABLET | Freq: Every day | ORAL | Status: DC

## 2015-03-26 MED ORDER — RIFAMPIN 300 MG PO CAPS: 600.0000 mg | ORAL_CAPSULE | Freq: Every day | ORAL | Status: DC

## 2015-03-26 MED ORDER — ETHAMBUTOL HCL 100 MG PO TABS: 15.0000 mg/kg | ORAL_TABLET | Freq: Every day | ORAL | Status: DC

## 2015-03-26 MED ORDER — PYRAZINAMIDE 500 MG PO TABS: 1000.0000 mg | ORAL_TABLET | Freq: Every day | ORAL | Status: DC

## 2015-03-26 MED ORDER — ISONIAZID 300 MG PO TABS: 300.0000 mg | ORAL_TABLET | Freq: Every day | ORAL | Status: DC

## 2015-03-26 NOTE — Progress Notes (Signed)
[facility]       Subjective: Feels better today   Antibiotics:  Anti-infectives    Start     Dose/Rate Route Frequency Ordered Stop   03/26/15 0000  ethambutol (MYAMBUTOL) 100 MG tablet     15 mg/kg  57.2 kg Oral Daily 03/26/15 1448     03/26/15 0000  isoniazid (NYDRAZID) 300 MG tablet     300 mg Oral Daily 03/26/15 1448     03/26/15 0000  pyrazinamide 500 MG tablet     1,000 mg Oral Daily 03/26/15 1448     03/26/15 0000  rifampin (RIFADIN) 300 MG capsule     600 mg Oral Daily 03/26/15 1448     03/21/15 1300  pyrazinamide tablet 1,000 mg     1,000 mg Oral Daily 03/21/15 1200     03/21/15 1230  ethambutol (MYAMBUTOL) tablet 850 mg     15 mg/kg  57.2 kg Oral Daily 03/21/15 1200     03/21/15 1230  rifampin (RIFADIN) capsule 600 mg     600 mg Oral Daily 03/21/15 1200     03/21/15 1230  isoniazid (NYDRAZID) tablet 300 mg     300 mg Oral Daily 03/21/15 1200     03/14/15 1445  ceFAZolin (ANCEF) IVPB 2 g/50 mL premix     2 g 100 mL/hr over 30 Minutes Intravenous 3 times per day 03/14/15 1426 03/27/15 0559   03/14/15 1400  piperacillin-tazobactam (ZOSYN) IVPB 3.375 g  Status:  Discontinued     3.375 g 12.5 mL/hr over 240 Minutes Intravenous 3 times per day 03/14/15 1319 03/14/15 1426   03/14/15 0830  piperacillin-tazobactam (ZOSYN) IVPB 3.375 g  Status:  Discontinued     3.375 g 12.5 mL/hr over 240 Minutes Intravenous Every 8 hours 03/14/15 0217 03/14/15 0651   03/14/15 0230  vancomycin (VANCOCIN) IVPB 750 mg/150 ml premix  Status:  Discontinued     750 mg 150 mL/hr over 60 Minutes Intravenous Every 12 hours 03/14/15 0217 03/16/15 1151   03/14/15 0215  piperacillin-tazobactam (ZOSYN) IVPB 3.375 g     3.375 g 100 mL/hr over 30 Minutes Intravenous  Once 03/14/15 0213 03/14/15 0323      Medications: Scheduled Meds: . antiseptic oral rinse  7 mL Mouth Rinse BID  .  ceFAZolin (ANCEF) IV  2 g Intravenous 3 times per day  . cyanocobalamin  1,000  mcg Intramuscular Once  . ethambutol  15 mg/kg Oral Daily  . feeding supplement (ENSURE)  1 Container Oral TID BM  . furosemide  20 mg Oral Daily  . isoniazid  300 mg Oral Daily  . megestrol  40 mg Oral Daily  . multivitamin with minerals  1 tablet Oral Daily  . pyrazinamide  1,000 mg Oral Daily  . vitamin B-6  50 mg Oral Daily  . rifampin  600 mg Oral Daily  . senna-docusate  1 tablet Oral QHS   Continuous Infusions: . lactated ringers 10 mL/hr at 03/20/15 1629   PRN Meds:.ibuprofen, oxyCODONE    Objective: Weight change: -1 lb 14.4 oz (-0.862 kg) No intake or output data in the 24 hours ending 03/26/15 1554 Blood pressure 109/60, pulse 92, temperature 99.4 F (37.4 C), temperature source Oral, resp. rate 18, height 5\' 2"  (1.575 m), weight 122 lb 9.6 oz (55.611 kg), last menstrual period 11/19/2014, SpO2 98 %. Temp:  [99 F (37.2 C)-99.4 F (37.4 C)] 99.4 F (37.4 C) (04/14 0640) Pulse Rate:  [92-98] 92 (  04/14 0640) BP: (108-109)/(60-75) 109/60 mmHg (04/14 0640) SpO2:  [98 %] 98 % (04/14 0640) Weight:  [122 lb 9.6 oz (55.611 kg)] 122 lb 9.6 oz (55.611 kg) (04/14 0500)  Physical Exam: General: Alert and awake, oriented x3, feeling out of breath HEENT: anicteric sclera, pupils reactive to light and accommodation, EOMI CVS regular rate, normal r,  no murmur rubs or gallops Chest: diminished breath sounds at the bases bilaterally  Abdomen: soft nontender, nondistended, normal bowel sounds, Extremities: less tenderness in bilateral knees and ankles, still warm but less tender Skin: no rashes Lymph: Right axillary lymph node surgical site is healing well Neuro: nonfocal  CBC: CBC Latest Ref Rng 03/26/2015 03/25/2015 03/24/2015  WBC 4.0 - 10.5 K/uL 7.2 6.9 6.8  Hemoglobin 12.0 - 15.0 g/dL 8.5(L) 8.8(L) 8.2(L)  Hematocrit 36.0 - 46.0 % 26.7(L) 27.4(L) 25.6(L)  Platelets 150 - 400 K/uL 532(H) 540(H) 530(H)       BMET  Recent Labs  03/24/15 0516 03/26/15 0503  NA 135  134*  K 4.0 4.6  CL 101 100  CO2 27 21  GLUCOSE 113* 116*  BUN <5* 8  CREATININE 0.54 0.53  CALCIUM 8.4 8.6     Liver Panel   Recent Labs  03/26/15 0503  PROT 6.6  ALBUMIN 2.2*  AST 16  ALT 7  ALKPHOS 68  BILITOT 0.6       Sedimentation Rate No results for input(s): ESRSEDRATE in the last 72 hours. C-Reactive Protein No results for input(s): CRP in the last 72 hours.  Micro Results: Recent Results (from the past 720 hour(s))  Body fluid culture     Status: None   Collection Time: 03/03/15  4:41 PM  Result Value Ref Range Status   Specimen Description PLEURAL  Final   Special Requests PLEURAL  Final   Gram Stain   Final    RARE WBC PRESENT, PREDOMINANTLY PMN NO ORGANISMS SEEN Performed at [facility]    Culture   Final    NO GROWTH 3 DAYS Performed at [facility]    Report Status 03/07/2015 FINAL  Final  AFB culture with smear     Status: None (Preliminary result)   Collection Time: 03/03/15  4:41 PM  Result Value Ref Range Status   Specimen Description PLEURAL  Final   Special Requests NONE  Final   Acid Fast Smear   Final    NO ACID FAST BACILLI SEEN Performed at [facility]    Culture   Final    CULTURE WILL BE EXAMINED FOR 6 WEEKS BEFORE ISSUING A FINAL REPORT Performed at [facility]    Report Status PENDING  Incomplete  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 tuberculosis, Direct CANCELED      Comment: Specimen already disgarded from referring [facility]. Will need to recollect patient.  Result canceled by the ancillary    Source (MTBPCR) CANCELED      Comment: Result canceled by the ancillary  Culture, blood (single)     Status: None   Collection Time: 03/12/15  1:25 PM  Result Value Ref Range Status   Culture STAPHYLOCOCCUS SPECIES (COAGULASE NEGATIVE)  Final   Organism ID, Bacteria STAPHYLOCOCCUS SPECIES (COAGULASE  NEGATIVE)  Final    Comment: Rifampin and Gentamicin should not be used as single drugs for treatment of Staph infections. This organism DOES NOT  demonstrate inducible Clindamycin resistance in vitro. Gram Stain Report Called to,Read Back By and Verified With: DR. Reginia Forts 03/13/15 AT 2300 RIDK       Susceptibility   Staphylococcus species (coagulase negative) -  (no method available)    PENICILLIN 0.25 Resistant     OXACILLIN <=0.25 Sensitive     CEFAZOLIN  Sensitive     GENTAMICIN <=0.5 Sensitive     CIPROFLOXACIN <=0.5 Sensitive     LEVOFLOXACIN <=0.12 Sensitive     TRIMETH/SULFA <=10 Sensitive     VANCOMYCIN 1 Sensitive     CLINDAMYCIN <=0.25 Sensitive     ERYTHROMYCIN >=8 Resistant     RIFAMPIN <=0.5 Sensitive     TETRACYCLINE <=1 Sensitive   Culture, blood (single)     Status: None   Collection Time: 03/12/15  1:25 PM  Result Value Ref Range Status   Organism ID, Bacteria STAPHYLOCOCCUS SPECIES (COAGULASE NEGATIVE)  Final    Comment: Susceptibilities performed on previous culture within the last 5 days. Gram Stain Report Called to,Read Back By and Verified With: DR. Reginia Forts 03/13/15 AT 2230 [place]   Blood Culture (routine x 2)     Status: None   Collection Time: 03/14/15  2:39 AM  Result Value Ref Range Status   Specimen Description BLOOD RIGHT WRIST  Final   Special Requests BOTTLES DRAWN AEROBIC ONLY 4CC  Final   Culture   Final    NO GROWTH 5 DAYS Performed at [facility]    Report Status 03/20/2015 FINAL  Final  Blood Culture (routine x 2)     Status: None   Collection Time: 03/14/15  2:46 AM  Result Value Ref Range Status   Specimen Description BLOOD LEFT ARM  Final   Special Requests BOTTLES DRAWN AEROBIC AND ANAEROBIC 5CC  Final   Culture   Final    NO GROWTH 5 DAYS Performed at [facility]    Report Status 03/20/2015 FINAL  Final  Urine culture     Status: None   Collection Time: 03/14/15  8:43 AM  Result Value Ref Range  Status   Specimen Description URINE, RANDOM  Final   Special Requests NONE  Final   Colony Count   Final    1,000 COLONIES/ML Performed at [facility]    Culture   Final    INSIGNIFICANT GROWTH Performed at [facility]    Report Status 03/16/2015 FINAL  Final  Culture, blood (routine x 2)     Status: None   Collection Time: 03/15/15  9:40 AM  Result Value Ref Range Status   Specimen Description BLOOD LEFT ARM  Final   Special Requests BOTTLES DRAWN AEROBIC ONLY 10 CC  Final   Culture   Final    NO GROWTH 5 DAYS Note: Culture results may be compromised due to an excessive volume of blood received in culture bottles. Performed at [facility]    Report Status 03/21/2015 FINAL  Final  Culture, blood (routine x 2)     Status: None   Collection Time: 03/15/15  9:45 AM  Result Value Ref Range Status   Specimen Description BLOOD LEFT WRIST  Final   Special Requests BOTTLES DRAWN AEROBIC ONLY 10 CC  Final   Culture   Final    NO GROWTH 5 DAYS Note: Culture results may be compromised due to an excessive volume of blood received in culture bottles. Performed at [facility]    Report Status 03/21/2015  FINAL  Final  Body fluid culture     Status: None   Collection Time: 03/17/15  2:27 PM  Result Value Ref Range Status   Specimen Description FLUID RIGHT PLEURAL  Final   Special Requests NONE  Final   Gram Stain   Final    RARE WBC PRESENT,BOTH PMN AND MONONUCLEAR NO ORGANISMS SEEN Performed at [facility]    Culture   Final    NO GROWTH 3 DAYS Performed at [facility]    Report Status 03/21/2015 FINAL  Final  Fungus Culture with Smear     Status: None (Preliminary result)   Collection Time: 03/19/15 12:18 PM  Result Value Ref Range Status   Specimen Description FLUID SYNOVIAL RIGHT KNEE  Final   Special Requests Normal  Final   Fungal Smear   Final    NO YEAST OR FUNGAL ELEMENTS SEEN Performed at [facility]     Culture   Final    CULTURE IN PROGRESS FOR FOUR WEEKS Performed at [facility]    Report Status PENDING  Incomplete  Body fluid culture     Status: None   Collection Time: 03/19/15 12:19 PM  Result Value Ref Range Status   Specimen Description FLUID SYNOVIAL RIGHT KNEE  Final   Special Requests Normal  Final   Gram Stain   Final    MODERATE WBC PRESENT, PREDOMINANTLY PMN NO ORGANISMS SEEN Performed at [facility]    Culture   Final    NO GROWTH 3 DAYS Performed at [facility]    Report Status 03/22/2015 FINAL  Final  AFB culture with smear     Status: None (Preliminary result)   Collection Time: 03/19/15 12:19 PM  Result Value Ref Range Status   Specimen Description FLUID SYNOVIAL RIGHT KNEE  Final   Special Requests Normal  Final   Acid Fast Smear   Final    NO ACID FAST BACILLI SEEN Performed at [facility]    Culture   Final    CULTURE WILL BE EXAMINED FOR 6 WEEKS BEFORE ISSUING A FINAL REPORT Performed at [facility]    Report Status PENDING  Incomplete  Culture, blood (routine x 2)     Status: None   Collection Time: 03/20/15  5:00 AM  Result Value Ref Range Status   Specimen Description BLOOD LEFT ARM  Final   Special Requests BOTTLES DRAWN AEROBIC AND ANAEROBIC 5ML  Final   Culture   Final    NO GROWTH 5 DAYS Performed at [facility]    Report Status 03/26/2015 FINAL  Final  Culture, blood (routine x 2)     Status: None   Collection Time: 03/20/15  5:10 AM  Result Value Ref Range Status   Specimen Description BLOOD RIGHT ARM  Final   Special Requests BOTTLES DRAWN AEROBIC AND ANAEROBIC 5ML  Final   Culture   Final    NO GROWTH 5 DAYS Performed at [facility]    Report Status 03/26/2015 FINAL  Final  Tissue culture     Status: None   Collection Time: 03/20/15  5:42 PM  Result Value Ref Range Status   Specimen Description TISSUE LYMPH NODE  Final   Special Requests NONE  Final   Gram Stain    Final    FEW WBC PRESENT, PREDOMINANTLY MONONUCLEAR NO ORGANISMS SEEN Performed at [facility]   Final  NO GROWTH 3 DAYS Performed at [facility]    Report Status 03/24/2015 FINAL  Final  Fungus Culture with Smear     Status: None (Preliminary result)   Collection Time: 03/20/15  5:42 PM  Result Value Ref Range Status   Specimen Description TISSUE LYMPH NODE  Final   Special Requests NONE  Final   Fungal Smear   Final    NO YEAST OR FUNGAL ELEMENTS SEEN Performed at [facility]    Culture   Final    CULTURE IN PROGRESS FOR FOUR WEEKS Performed at [facility]    Report Status PENDING  Incomplete  AFB culture with smear     Status: None (Preliminary result)   Collection Time: 03/20/15  5:42 PM  Result Value Ref Range Status   Specimen Description TISSUE LYMPH NODE  Final   Special Requests NONE  Final   Acid Fast Smear   Final    NO ACID FAST BACILLI SEEN Performed at [facility]    Culture   Final    CULTURE WILL BE EXAMINED FOR 6 WEEKS BEFORE ISSUING A FINAL REPORT Performed at [facility]    Report Status PENDING  Incomplete    Studies/Results: No results found.    Assessment/Plan:  Principal Problem:   Coagulase negative Staphylococcus bacteremia Active Problems:   Goiter   Bilateral pleural effusion   Arthralgia   Amenorrhea   LAD (lymphadenopathy), axillary   Bilateral edema of lower extremity   Fever   Sepsis   Transaminasemia   Latent tuberculosis by blood test   Bacteremia   Knee effusion   Pleural effusion   Tuberculosis   Elevated d-dimer   SOB (shortness of breath)   Effusion into joint   Extrapulmonary tuberculosis   Granulomatous lymphadenitis    Nichole Perry is a 44 y.o. female from [place] who began having fevers, chills and sweats a little over 3 weeks ago. She is also noted migratory joint swelling and pain.She  first began in both feet and both knees. The  swelling and pain in her feet has improved but now she has swelling in her left wrist. She is also had shortness of breath and was found to have bilateral pleural effusions on chest x-ray and CT scan. There was some atelectasis in the bases but no underlying infiltrates were noted. She underwent thoracentesis revealing exudative effusions. Gram stain and AFB stain were negative. Routine cultures were negative and AFB cultures are negative to date.Her Quantiferon TB assay was also positive. Pleural fluid cytology showed reactive mesothelial cells. She also underwent a biopsy of her thyroid gland that was benign as well on the 22nd.   She was then seen by her primary care physician in follow-up and found to still be suffering from malaise and to be febrile. Blood cultures were obtained on03/31/2016 in both are growing Methicillin Sensitive regulations negative staphylococcal species.  She continued to have pain in both knees as well as her ankles. She has a dry nonproductive cough.  She has several nonspecific inflammatory markers are elevated including a sedimentation rate above 100. 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 There were performed but cultures have been  taken  #5 Knee effusions, ankle effusions: these WERE indeed aspirated with WBC of 8k, no crystals and PMN predominance  Suspect also related to TB    #6  Coagulase negative staphylococcal bacteremia: Strange that it is NOT growing on repeat cultures here on the 2nd  She is on day 12 of IV ancef day 13 of IV abx  Stop IV abx after tonight's dose    #4  Inguinal LA: agree that excisional lymph node biopsy and pathology is pending    The primary team to manage her at this point is [facility]  I will arrange [facility] for her with [place] in our clinic in the next 3-4 weeks.  I will sign off for now please call with further questions.      LOS: 12 days   Alcide Evener 03/26/2015, 3:54 PM

## 2015-03-26 NOTE — Discharge Summary (Addendum)
Physician Discharge Summary  Nichole Perry MRN: [id number] DOB/AGE: [birth year] 44 y.o.  PCP: Maximino Greenland, MD   Admit date: 03/14/2015 Discharge date: 03/26/2015     Principal Problem:   Coagulase negative Staphylococcus bacteremia Active Problems:   Goiter   Bilateral pleural effusion   Arthralgia   Amenorrhea   LAD (lymphadenopathy), axillary   Bilateral edema of lower extremity   Fever   Sepsis   Transaminasemia   Latent tuberculosis by blood test   Bacteremia   Knee effusion   Pleural effusion   Tuberculosis   Elevated d-dimer   SOB (shortness of breath)   Effusion into joint   Extrapulmonary tuberculosis   Granulomatous lymphadenitis  Follow up recommendations Health dept in one week ID in one month    Medication List    STOP taking these medications        azithromycin 500 MG tablet  Commonly known as:  ZITHROMAX      TAKE these medications        acetaminophen 500 MG tablet  Commonly known as:  TYLENOL  Take 500 mg by mouth every 6 (six) hours as needed for fever.     ethambutol 100 MG tablet  Commonly known as:  MYAMBUTOL  Take 8.5 tablets (850 mg total) by mouth daily.     furosemide 20 MG tablet  Commonly known as:  LASIX  Take 1 tablet (20 mg total) by mouth daily.     isoniazid 300 MG tablet  Commonly known as:  NYDRAZID  Take 1 tablet (300 mg total) by mouth daily.     meloxicam 15 MG tablet  Commonly known as:  MOBIC  Take 1 tablet (15 mg total) by mouth daily.     pyrazinamide 500 MG tablet  Take 2 tablets (1,000 mg total) by mouth daily.     pyridOXINE 50 MG tablet  Commonly known as:  B-6  Take 1 tablet (50 mg total) by mouth daily.     rifampin 300 MG capsule  Commonly known as:  RIFADIN  Take 2 capsules (600 mg total) by mouth daily.     VITAMIN D (CHOLECALCIFEROL) PO  Take 1 capsule by mouth daily.        Discharge Condition:   Disposition: home    Consults: ID    Significant Diagnostic  Studies: Dg Chest 1 View  03/17/2015   CLINICAL DATA:  Right sided thoracentesis, status post removal of 580 cc of yellow fluid. Cough.  EXAM: CHEST  1 VIEW  COMPARISON:  03/14/2015  FINDINGS: No pneumothorax. Mild atelectasis medially at the right lung base and the right perihilar region. Prior blunting of the right costophrenic angle resolved. There is a moderate left pleural effusion silhouetting the left heart border.  Borderline enlargement of the cardiopericardial silhouette.  IMPRESSION: 1. Resolution of prior right pleural effusion, with some residual medial right-sided atelectasis. Increase in left pleural effusion, currently moderate in size, with associated passive atelectasis.   Electronically Signed   By: Van Clines M.D.   On: 03/17/2015 14:56   Dg Chest 1 View  03/03/2015   CLINICAL DATA:  Post right thoracentesis  EXAM: CHEST  1 VIEW  COMPARISON:  Chest x-ray of 02/21/2015  FINDINGS: Some of the right pleural effusion has been evacuated. No obvious pneumothorax is seen, but on the frontal view there is a fluid level at the right lung base and a small right hydro pneumothorax is suspected. Opacity at the left lung base persists  consistent with left effusion, atelectasis, and possibly pneumonia. Heart size is stable.  IMPRESSION: 1. Suspect small right hydro pneumothorax after right thoracentesis. 2. Reduction of right pleural effusion.   Electronically Signed   By: Ivar Drape M.D.   On: 03/03/2015 16:17   Dg Chest 2 View  03/13/2015   CLINICAL DATA:  Cough  EXAM: CHEST  2 VIEW  COMPARISON:  March 03, 2015 chest radiograph and chest CT February 21, 2015  FINDINGS: There is a small pleural effusion on the right. No pneumothorax appreciable. There is mild bibasilar atelectasis. Lungs are otherwise clear. Heart size and pulmonary vascularity are normal. No adenopathy. There is evidence of sclerosis in several ribs consistent with prior rib trauma. No acute fracture apparent. Nipple shadows are  noted bilaterally.  IMPRESSION: Small right pleural effusion. Mild bibasilar atelectasis. No airspace consolidation or edema.   Electronically Signed   By: Lowella Grip III M.D.   On: 03/13/2015 09:20   Ct Angio Chest Pe W/cm &/or Wo Cm  03/20/2015   CLINICAL DATA:  Shortness of breath. Tachycardia. Elevated D-dimer.  EXAM: CT ANGIOGRAPHY CHEST WITH CONTRAST  TECHNIQUE: Multidetector CT imaging of the chest was performed using the standard protocol during bolus administration of intravenous contrast. Multiplanar CT image reconstructions and MIPs were obtained to evaluate the vascular anatomy.  CONTRAST:  55mL OMNIPAQUE IOHEXOL 350 MG/ML SOLN  COMPARISON:  02/21/2015  FINDINGS: Technically adequate study with good opacification of the central and segmental pulmonary arteries. No focal filling defects. No evidence of significant pulmonary embolus.  Normal heart size. Normal caliber thoracic aorta without evidence of dissection. Great vessel origins are patent. Esophagus is decompressed. No significant lymphadenopathy in the mediastinum. Again demonstrated is lymphadenopathy in the right axilla with enlarged lymph nodes measuring up to about 15 mm short axis dimension. Large left thyroid gland nodule measuring up to 3.5 cm diameter. No change since prior study.  Moderate size bilateral pleural effusions with atelectasis or consolidation in both lung bases. This is similar to prior study. No pneumothorax.  Included portion of upper abdominal organs is unremarkable except for a small accessory spleen. Degenerative changes in the spine. No destructive bone lesions.  Review of the MIP images confirms the above findings.  IMPRESSION: No evidence of significant pulmonary embolus. Again demonstrated are moderate bilateral pleural effusions with atelectasis or consolidation in the lung bases. Axillary lymphadenopathy. Appearance is nonspecific and follow-up suggested. Left thyroid gland nodule. Follow-up ultrasound has  been suggested.   Electronically Signed   By: Lucienne Capers M.D.   On: 03/20/2015 04:28   US Soft Tissue Head/neck  02/25/2015   CLINICAL DATA:  Thyroid nodule.  EXAM: THYROID ULTRASOUND  TECHNIQUE: Ultrasound examination of the thyroid gland and adjacent soft tissues was performed.  COMPARISON:  Chest CT 02/21/2015  FINDINGS: Right thyroid lobe  Measurements: 5.8 x 1.8 x 1.9 cm. There is a subtle heterogeneous nodule along the medial right thyroid lobe that measures 0.6 x 0.3 x 0.7 cm. There is a heterogeneous nodule in the inferior right thyroid lobe that measures 1.0 x 0.5 x 0.8 cm.  Left thyroid lobe  Measurements: 7.1 x 2.7 x 2.6 cm. There is a dominant nodule occupying the mid and lower aspect of the left thyroid lobe. This lesion corresponds with the nodule seen on the recent CT. This dominant left thyroid nodule measures 4.7 x 2.5 x 4.1 cm. Nodule is heterogeneous with small cystic areas. Cannot exclude small calcifications in this nodule.  Isthmus  Thickness: 0.4 cm.  No nodules visualized.  Lymphadenopathy  None visualized.  IMPRESSION: Bilateral thyroid nodules. There is a dominant left thyroid nodule that measures up to 4.7 cm. Findings meet consensus criteria for biopsy. Ultrasound-guided fine needle aspiration should be considered, as per the consensus statement: Management of Thyroid Nodules Detected at [place]: Society of Radiologists in [place]. Radiology 2005; [id number].   Electronically Signed   By: Markus Daft M.D.   On: 02/25/2015 16:21   US Thyroid Biopsy  03/10/2015   CLINICAL DATA:  44 year old female with a history of left-sided thyroid nodule. She has been referred for percutaneous biopsy.  EXAM: ULTRASOUND GUIDED NEEDLE ASPIRATE BIOPSY OF THE THYROID GLAND  COMPARISON:  02/25/2015  PROCEDURE: The procedure, risks, benefits, and alternatives were explained to the patient. Questions regarding the procedure were encouraged and answered. The patient  understands and consents to the procedure.  Ultrasound survey was performed with images stored and sent to PACs.  The left neck was prepped with Betadine in a sterile fashion, and a sterile drape was applied covering the operative field. A sterile gown and sterile gloves were used for the procedure. Local anesthesia was provided with 1% Lidocaine.  Ultrasound guidance was used to infiltrate the region with 1% lidocaine for local anesthesia. Four separate 25 gauge fine needle biopsy were then acquired of the left thyroid nodule using ultrasound guidance. Images were stored.  Slide preparation was performed.  Final image was stored after biopsy.  Patient tolerated the procedure well and remained hemodynamically stable throughout.  No complications were encountered and no significant blood loss was encounter  FINDINGS: Ultrasound survey of the left thyroid demonstrates heterogeneous nodule inferiorly.  Images during the case demonstrate needle tip within the nodule on each needle pass.  Final ultrasound image demonstrates no complicating features.  IMPRESSION: Status post ultrasound-guided biopsy of left inferior thyroid nodule. Specimen sent to pathology for complete histopathologic analysis.  Signed,  Dulcy Fanny. Earleen Newport, DO  [facility]  [facility]   Electronically Signed   By: Corrie Mckusick D.O.   On: 03/10/2015 14:44   Dg Chest Port 1 View  03/24/2015   CLINICAL DATA:  Shortness of breath.  TB.  EXAM: PORTABLE CHEST - 1 VIEW  COMPARISON:  03/19/2015  FINDINGS: Decreasing bilateral pleural effusions and atelectasis, left more than right. No evidence of edema, new pneumonia, or pneumothorax.  Normal heart size and stable mediastinal contours.  IMPRESSION: Decreased bilateral pleural effusion, now small volume.   Electronically Signed   By: Monte Fantasia M.D.   On: 03/24/2015 02:21   Dg Chest Port 1 View  03/20/2015   CLINICAL DATA:  Walking Corpse syndrome  EXAM:  PORTABLE CHEST - 1 VIEW  COMPARISON:  03/17/2015  FINDINGS: The cardiac enlargement with pulmonary vascular congestion and perihilar edema, increasing since prior study. Small to moderate left pleural effusion is also increasing. No pneumothorax.  IMPRESSION: Increasing pulmonary vascular congestion, edema, and left pleural effusion since prior study.   Electronically Signed   By: Lucienne Capers M.D.   On: 03/20/2015 00:11   Dg Chest Port 1 View  03/14/2015   CLINICAL DATA:  Abnormal labs, suspicious for infection.  EXAM: PORTABLE CHEST - 1 VIEW  COMPARISON:  03/12/2015  FINDINGS: There is persistent right pleural fluid or thickening in the lateral base. Mild linear basilar opacities persist and may represent scarring. Lungs are otherwise clear. Hilar, mediastinal and cardiac contours are unremarkable and unchanged. Pulmonary  vasculature is normal.  IMPRESSION: Unchanged, would minimal linear basilar opacities and right lateral pleural fluid/thickening.   Electronically Signed   By: Andreas Newport M.D.   On: 03/14/2015 03:09   Dg Knee Complete 4 Views Right  03/05/2015   CLINICAL DATA:  Acute onset of right knee pain and swelling. Initial encounter.  EXAM: RIGHT KNEE - COMPLETE 4+ VIEW  COMPARISON:  None.  FINDINGS: There is no evidence of fracture or dislocation. The joint spaces are preserved. No significant degenerative change is seen; the patellofemoral joint is grossly unremarkable in appearance.  A small knee joint effusion is noted. The visualized soft tissues are otherwise unremarkable.  IMPRESSION: 1. No evidence of fracture or dislocation. 2. Small knee joint effusion noted.   Electronically Signed   By: Garald Balding M.D.   On: 03/05/2015 22:31   Dg Hand Complete Right  03/05/2015   CLINICAL DATA:  Bilateral hand pain and swelling. Initial encounter.  EXAM: RIGHT HAND - COMPLETE 3+ VIEW  COMPARISON:  None.  FINDINGS: There is no evidence of fracture or dislocation. The joint spaces are  preserved. The carpal rows are intact, and demonstrate normal alignment. Mild dorsal soft tissue swelling is noted about the wrist.  IMPRESSION: No evidence of fracture or dislocation.   Electronically Signed   By: Garald Balding M.D.   On: 03/05/2015 22:30   Dg Knee Ap/lat W/sunrise Left  03/19/2015   CLINICAL DATA:  Pain and swelling.  No appreciable trauma  EXAM: LEFT KNEE 3 VIEWS  COMPARISON:  None.  FINDINGS: Frontal, lateral, and sunrise patellar images were obtained. There is no fracture or dislocation. Joint spaces appear intact. No effusion. No erosive change.  IMPRESSION: No fracture or effusion.  No appreciable arthropathy.   Electronically Signed   By: Lowella Grip III M.D.   On: 03/19/2015 13:55   Dg Knee Ap/lat W/sunrise Right  03/19/2015   CLINICAL DATA:  Pain and swelling.  No history of trauma  EXAM: RIGHT KNEE 3 VIEWS  COMPARISON:  March 05, 2015  FINDINGS: Frontal, lateral, and sunrise patellar images were obtained. No fracture or dislocation. No appreciable joint effusion. Joint spaces appear intact. No erosive change.  IMPRESSION: No fracture or effusion.  No appreciable arthropathy.   Electronically Signed   By: Lowella Grip III M.D.   On: 03/19/2015 13:57   US Thoracentesis Asp Pleural Space W/img Guide  03/17/2015   INDICATION: Symptomatic R sided pleural effusion  EXAM: US THORACENTESIS ASP PLEURAL SPACE W/IMG GUIDE  COMPARISON:  None.  MEDICATIONS: 10 cc 1% lidocaine  COMPLICATIONS: None immediate  TECHNIQUE: Informed written consent was obtained from the patient after a discussion of the risks, benefits and alternatives to treatment. A timeout was performed prior to the initiation of the procedure.  Initial ultrasound scanning demonstrates a right pleural effusion. The lower chest was prepped and draped in the usual sterile fashion. 1% lidocaine was used for local anesthesia.  Under direct ultrasound guidance, a 19 gauge, 7-cm, Yueh catheter was introduced. An ultrasound  image was saved for documentation purposes. the thoracentesis was performed. The catheter was removed and a dressing was applied. The patient tolerated the procedure well without immediate post procedural complication. The patient was escorted to have an upright chest radiograph.  FINDINGS: A total of approximately 580 cc of yellow fluid was removed. Requested samples were sent to the laboratory.  IMPRESSION: Successful ultrasound-guided R sided thoracentesis yielding 580 cc of pleural fluid.  Read by:  Olin Hauser  [facility]   Electronically Signed   By: Sandi Mariscal M.D.   On: 03/17/2015 15:13   US Thoracentesis Asp Pleural Space W/img Guide  03/03/2015   INDICATION: Dyspnea, bilateral pleural effusions; request is made for diagnostic and therapeutic right thoracentesis.  EXAM: ULTRASOUND GUIDED DIAGNOSTIC AND THERAPEUTIC RIGHT THORACENTESIS  COMPARISON:  None.  MEDICATIONS: None  COMPLICATIONS: None immediate  TECHNIQUE: Informed written consent was obtained from the patient via interpreter after a discussion of the risks, benefits and alternatives to treatment. A timeout was performed prior to the initiation of the procedure.  Initial ultrasound scanning demonstrates a pleural effusion. The lower chest was prepped and draped in the usual sterile fashion. 1% lidocaine was used for local anesthesia.  An ultrasound image was saved for documentation purposes. A 6 Fr Safe-T-Centesis catheter was introduced. The thoracentesis was performed. The catheter was removed and a dressing was applied. The patient tolerated the procedure well without immediate post procedural complication. The patient was escorted to have an upright chest radiograph.  FINDINGS: A total of approximately 360 cc's of slightly turbid ,yellow fluid was removed. Requested samples were sent to the laboratory.  IMPRESSION: Successful ultrasound-guided diagnostic and therapeutic right sided thoracentesis yielding 360 cc's of pleural fluid.  Read by:  Rowe Robert, PA-C   Electronically Signed   By: Markus Daft M.D.   On: 03/03/2015 16:41    Microbiology: Recent Results (from the past 240 hour(s))  Body fluid culture     Status: None   Collection Time: 03/17/15  2:27 PM  Result Value Ref Range Status   Specimen Description FLUID RIGHT PLEURAL  Final   Special Requests NONE  Final   Gram Stain   Final    RARE WBC PRESENT,BOTH PMN AND MONONUCLEAR NO ORGANISMS SEEN Performed at [facility]    Culture   Final    NO GROWTH 3 DAYS Performed at [facility]    Report Status 03/21/2015 FINAL  Final  Fungus Culture with Smear     Status: None (Preliminary result)   Collection Time: 03/19/15 12:18 PM  Result Value Ref Range Status   Specimen Description FLUID SYNOVIAL RIGHT KNEE  Final   Special Requests Normal  Final   Fungal Smear   Final    NO YEAST OR FUNGAL ELEMENTS SEEN Performed at [facility]    Culture   Final    CULTURE IN PROGRESS FOR FOUR WEEKS Performed at [facility]    Report Status PENDING  Incomplete  Body fluid culture     Status: None   Collection Time: 03/19/15 12:19 PM  Result Value Ref Range Status   Specimen Description FLUID SYNOVIAL RIGHT KNEE  Final   Special Requests Normal  Final   Gram Stain   Final    MODERATE WBC PRESENT, PREDOMINANTLY PMN NO ORGANISMS SEEN Performed at [facility]    Culture   Final    NO GROWTH 3 DAYS Performed at [facility]    Report Status 03/22/2015 FINAL  Final  AFB culture with smear     Status: None (Preliminary result)   Collection Time: 03/19/15 12:19 PM  Result Value Ref Range Status   Specimen Description FLUID SYNOVIAL RIGHT KNEE  Final   Special Requests Normal  Final   Acid Fast Smear   Final    NO ACID FAST BACILLI SEEN Performed at [facility]    Culture   Final    CULTURE  WILL BE EXAMINED FOR 6 WEEKS BEFORE ISSUING A FINAL REPORT Performed at [facility]    Report Status PENDING   Incomplete  Culture, blood (routine x 2)     Status: None   Collection Time: 03/20/15  5:00 AM  Result Value Ref Range Status   Specimen Description BLOOD LEFT ARM  Final   Special Requests BOTTLES DRAWN AEROBIC AND ANAEROBIC 5ML  Final   Culture   Final    NO GROWTH 5 DAYS Performed at [facility]    Report Status 03/26/2015 FINAL  Final  Culture, blood (routine x 2)     Status: None   Collection Time: 03/20/15  5:10 AM  Result Value Ref Range Status   Specimen Description BLOOD RIGHT ARM  Final   Special Requests BOTTLES DRAWN AEROBIC AND ANAEROBIC 5ML  Final   Culture   Final    NO GROWTH 5 DAYS Performed at [facility]    Report Status 03/26/2015 FINAL  Final  Tissue culture     Status: None   Collection Time: 03/20/15  5:42 PM  Result Value Ref Range Status   Specimen Description TISSUE LYMPH NODE  Final   Special Requests NONE  Final   Gram Stain   Final    FEW WBC PRESENT, PREDOMINANTLY MONONUCLEAR NO ORGANISMS SEEN Performed at [facility]    Culture   Final    NO GROWTH 3 DAYS Performed at [facility]    Report Status 03/24/2015 FINAL  Final  Fungus Culture with Smear     Status: None (Preliminary result)   Collection Time: 03/20/15  5:42 PM  Result Value Ref Range Status   Specimen Description TISSUE LYMPH NODE  Final   Special Requests NONE  Final   Fungal Smear   Final    NO YEAST OR FUNGAL ELEMENTS SEEN Performed at [facility]    Culture   Final    CULTURE IN PROGRESS FOR FOUR WEEKS Performed at [facility]    Report Status PENDING  Incomplete  AFB culture with smear     Status: None (Preliminary result)   Collection Time: 03/20/15  5:42 PM  Result Value Ref Range Status   Specimen Description TISSUE LYMPH NODE  Final   Special Requests NONE  Final   Acid Fast Smear   Final    NO ACID FAST BACILLI SEEN Performed at [facility]    Culture   Final    CULTURE WILL BE EXAMINED FOR 6  WEEKS BEFORE ISSUING A FINAL REPORT Performed at [facility]    Report Status PENDING  Incomplete     Labs: Results for orders placed or performed during the hospital encounter of 03/14/15 (from the past 48 hour(s))  CBC     Status: Abnormal   Collection Time: 03/25/15  5:20 AM  Result Value Ref Range   WBC 6.9 4.0 - 10.5 K/uL   RBC 3.32 (L) 3.87 - 5.11 MIL/uL   Hemoglobin 8.8 (L) 12.0 - 15.0 g/dL   HCT 27.4 (L) 36.0 - 46.0 %   MCV 82.5 78.0 - 100.0 fL   MCH 26.5 26.0 - 34.0 pg   MCHC 32.1 30.0 - 36.0 g/dL   RDW 14.7 11.5 - 15.5 %   Platelets 540 (H) 150 - 400 K/uL  Comprehensive metabolic panel     Status: Abnormal   Collection Time: 03/26/15  5:03 AM  Result Value Ref Range  Sodium 134 (L) 135 - 145 mmol/L   Potassium 4.6 3.5 - 5.1 mmol/L   Chloride 100 96 - 112 mmol/L   CO2 21 19 - 32 mmol/L   Glucose, Bld 116 (H) 70 - 99 mg/dL   BUN 8 6 - 23 mg/dL   Creatinine, Ser 0.53 0.50 - 1.10 mg/dL   Calcium 8.6 8.4 - 10.5 mg/dL   Total Protein 6.6 6.0 - 8.3 g/dL   Albumin 2.2 (L) 3.5 - 5.2 g/dL   AST 16 0 - 37 U/L   ALT 7 0 - 35 U/L   Alkaline Phosphatase 68 39 - 117 U/L   Total Bilirubin 0.6 0.3 - 1.2 mg/dL   GFR calc non Af Amer >90 >90 mL/min   GFR calc Af Amer >90 >90 mL/min    Comment: (NOTE) The eGFR has been calculated using the CKD EPI equation. This calculation has not been validated in all clinical situations. eGFR's persistently <90 mL/min signify possible Chronic Kidney Disease.    Anion gap 13 5 - 15  CBC     Status: Abnormal   Collection Time: 03/26/15  5:03 AM  Result Value Ref Range   WBC 7.2 4.0 - 10.5 K/uL   RBC 3.24 (L) 3.87 - 5.11 MIL/uL   Hemoglobin 8.5 (L) 12.0 - 15.0 g/dL   HCT 26.7 (L) 36.0 - 46.0 %   MCV 82.4 78.0 - 100.0 fL   MCH 26.2 26.0 - 34.0 pg   MCHC 31.8 30.0 - 36.0 g/dL   RDW 14.9 11.5 - 15.5 %   Platelets 532 (H) 150 - 400 K/uL     Nichole Perry is a 44 y.o. female from [place] who began having fevers, chills and  sweats a little over 3 weeks ago. She is also noted migratory joint swelling and pain.She first began in both feet and both knees. The swelling and pain in her feet has improved but now she has swelling in her left wrist. She is also had shortness of breath and was found to have bilateral pleural effusions on chest x-ray and CT scan. There was some atelectasis in the bases but no underlying infiltrates were noted. She underwent thoracentesis revealing exudative effusions. Gram stain and AFB stain were negative. Routine cultures were negative and AFB cultures are negative to date.Her Quantiferon TB assay was also positive. Pleural fluid cytology showed reactive mesothelial cells. She also underwent a biopsy of her thyroid gland that was benign as well on the 22nd.   She was then seen by her primary care physician in follow-up and found to still be suffering from malaise and to be febrile. Blood cultures were obtained on03/31/2016 in both are growing Methicillin Sensitive regulations negative staphylococcal species.  She continued to have pain in both knees as well as her ankles. She has a dry nonproductive cough.  She has several nonspecific inflammatory markers are elevated including a sedimentation rate above 100. 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  Follow up with  health department  And ID to receive therapy for extrapulmonary tuberculosis. Discussed with case mx to set this up quantiferon + in March 2016)- TB treatment started on 4/9- Rifampin, Isoniazid, Pyridoxine, ethambutol with Vitamin B6   #3 exudative pleural effusions  adenosine deaminase or MTB PCR's ETC appear to have been CANCELLED  , reflecting problem with our lab provider interface. There were performed but cultures have been taken   #4 Inguinal LA: s/p  excisional lymph node biopsy and pathology shows granulomatous lymphadenitis    #5 Knee effusions, ankle effusions: these WERE indeed aspirated with WBC of 8k, no crystals and PMN predominance  Suspect also related to TB or whatever is the primary process driving her mx complaints.  #6 ? Hepatitis B: her surface antigen is negative  Transaminasemia -Resolved, secondary to sepsis -No abdominal pain, no vomiting -Hepatitis B surface antigen, hepatitis C antibody--negative -HIV antibody is negative  Arthralgias and myalgias/ Lower extremity and hand edema and pain -Autoimmune workup has been negative including C3, C4, anti-CCP, ANA, RF, ENA, anti-dsDNA, Anti-SmAb -Lower ext doppler negative for DVT -improving with standard antibiotics -Continues to have swelling in upper ext and knees- improved -Consulted orthopedics- conducted aspiration of right knee at bedside -Right knee fluid culture: no organisms seen; no yeast or fungal elements -knee pain seems have to have improved, but complains of pain and swelling in hands -PT consulted  Normocytic Anemia -Likely chronic. Hb upon admission 10, fluctuates Hb between 8-9 (drop possibly dilutional) -Anemia panel: Iron 13, Ferritin 190, Vit B12 239 -Patient given dose of ferahem and B12  Hypoalbuminemia/malnutrition -Nutrition consulted -Continue megace and nutritional supplements -Given dose of albumin, increased from 1.6 to  2.2 -Encouraged patient to eat  Constipation  -Improved, continue bowel regimen   Dyspnea  -Resolved, patient became tachypneic with tachycardia overnight on 4/7-8 -D-dimer was noted to be elevated, CT chest was conducted showing no pulmonary embolism however moderate bilateral pleural effusions. -Repeat CXR on 03/23/2015: Decreased in B/L pleural effusion, now small volume  -Continue low dose lasix -Weaned off of nasal canula   Discharge Exam:    Blood pressure 109/60, pulse 92, temperature 99.4 F (37.4 C), temperature source Oral, resp. rate 18, height _0  (1.575 m), weight 55.611 kg (122 lb 9.6 oz), last menstrual period 11/19/2014, SpO2 98 %.  General: Well developed, NAD  Cardiovascular: S1 S2 auscultated,RRR, no murmurs  Respiratory: Clear but slightly diminished at the bases  Abdomen: Soft, nontender, nondistended, + bowel sounds  Extremities:minimal B/L hand/wrist/ankle/foot edema  Neuro: AAOx3, nonfocal, able to move all extremities  Psych: Pleasant, appropriate mood and affect        Discharge Instructions    Diet - low sodium heart healthy    Complete by:  As directed      Increase activity slowly    Complete by:  As directed            Follow-up Information    Follow up with Alcide Evener, MD. Schedule an appointment as soon as possible for a visit in 1 month.   Specialty:  Infectious Diseases   Contact information:   [address] [place] [address] [phone]       Follow up with Maximino Greenland, MD. Schedule an  appointment as soon as possible for a visit in 3 days.   Specialty:  Internal Medicine   Contact information:   [address] [place] [address] [phone]       Signed: Reyne Dumas 03/26/2015, 2:49 PM

## 2015-03-26 NOTE — Progress Notes (Signed)
Physical Therapy Treatment Patient Details Name: Nichole Perry MRN: [id number] DOB: 1971-08-04 Today's Date: 03/26/2015    History of Present Illness Nichole Perry is a 44 y.o. female from [place] who began having fevers, chills and sweats a little over 3 weeks ago. She is also noted migratory joint swelling and pain.She first began in both feet and both knees. The swelling and pain in her feet has improved but now she has swelling in her left wrist. She is also had shortness of breath and was found to have bilateral pleural effusions on chest x-ray and CT scan. There was some atelectasis in the bases but no underlying infiltrates were noted. She underwent thoracentesis revealing exudative effusions. Gram stain and AFB stain were negative. Routine cultures were negative and AFB cultures are negative to date.Her Quantiferon TB assay was also positive. Pleural fluid cytology showed reactive mesothelial cells.     PT Comments    Progressing with mobility, but still feels limited due to ankle stiffness, swelling and pain.  Feel balance also affected due to decreased proprioception.  Will continue to progress as tolerated with standing activities for improved proprioceptive use and normalized gait patterns.  Follow Up Recommendations  No PT follow up     Equipment Recommendations  None recommended by PT    Recommendations for Other Services       Precautions / Restrictions Precautions Precaution Comments: Airborne with N-95 mask    Mobility  Bed Mobility Overal bed mobility: Modified Independent                Transfers Overall transfer level: Modified independent                  Ambulation/Gait Ambulation/Gait assistance: Min guard;Supervision Ambulation Distance (Feet): 150 Feet Assistive device: None Gait Pattern/deviations: Step-through pattern;Antalgic;Decreased stride length     General Gait Details: increased pain in post aspect of knees with further distance  ambulation in the room; felt better than last sessioin and family member reports swelling is better; still reports feeling her walking is not as straight as normal   Financial trader Rankin (Stroke Patients Only)       Balance           Standing balance support: No upper extremity supported Standing balance-Leahy Scale: Good                      Cognition Arousal/Alertness: Awake/alert Behavior During Therapy: WFL for tasks assessed/performed Overall Cognitive Status: Within Functional Limits for tasks assessed                      Exercises General Exercises - Lower Extremity Toe Raises: AROM;Both;10 reps;Seated Heel Raises: AROM;Both;10 reps;Seated (attempted in standing; pt reports increased pain) Other Exercises Other Exercises: heel cord stretch with blanket x 20 second  hold; instructed pt to perform on each leg throughout the day    General Comments General comments (skin integrity, edema, etc.): family member in room translating for patient      Pertinent Vitals/Pain Pain Score: 3  Pain Location: right ankle and posterior aspect of knees Pain Intervention(s): Monitored during session    Home Living                      Prior Function            PT Goals (current  goals can now be found in the care plan section) Progress towards PT goals: Progressing toward goals    Frequency  Min 3X/week    PT Plan Current plan remains appropriate    Co-evaluation             End of Session Equipment Utilized During Treatment: Gait belt Activity Tolerance: Patient tolerated treatment well Patient left: in bed;with family/visitor present;with call bell/phone within reach     Time: 1209-1222 PT Time Calculation (min) (ACUTE ONLY): 13 min  Charges:  $Gait Training: 8-22 mins                    G Codes:      WYNN,CYNDI Mar 30, 2015, 1:13 PM  Magda Kiel, [place] 03/30/2015

## 2015-03-26 NOTE — Care Management Note (Signed)
CARE MANAGEMENT NOTE 03/26/2015  Patient:  IREAN, KENDRICKS   Account Number:  [id number]  Date Initiated:  03/24/2015  Documentation initiated by:  Ricki Miller  Subjective/Objective Assessment:   44 yr old female admitted with bacterimia, sepsis. patient has TB.     Action/Plan:   Case Manager contacted the Health Dept. [phone] and spoke with TB RN Case manager.   Anticipated DC Date:  03/26/2015   Anticipated DC Plan:  HOME/SELF CARE      DC Planning Services  CM consult      Choice offered to / List presented to:     DME arranged  NA        HH arranged  NA      Status of service:  Completed, signed off Medicare Important Message given?   (If response is "NO", the following Medicare IM given date fields will be blank) Date Medicare IM given:   Medicare IM given by:   Date Additional Medicare IM given:   Additional Medicare IM given by:    Discharge Disposition:  HOME/SELF CARE  Per UR Regulation:  Reviewed for med. necessity/level of care/duration of stay  If discussed at [place] of Stay Meetings, dates discussed:    Comments:  03/26/15 3:45pm Ricki Miller, RN BSN Case Manager Case Manager notified Roxan Hockey, RN TB Case Manager of patient's discharge. They will contact patient this evening to schedule visit for 03/27/15. This information was placed on patient's discharge paperwork and provided to bedside RN.    03/24/15 Ricki Miller, RN BSN Case Manager case Manager spoke with Roxan Hockey, RN TB Case Manager [phone] concerning patient. Per her request patient's demographics and MD notes faxed to her at [phone]. Dr. Ree Kida was given the number to contact Dr.Backmann-[phone] at the Health Dept.

## 2015-03-26 NOTE — Progress Notes (Signed)
    [facility] for Infectious Disease    NOTE PATH BACK WITH NECROTIZING GRANULOMA,   SMEARS NEGATIVE BUT AGAIN PUTS EXTRAPULM TB HIGH IN DIFFERENTIAL  I AM FILLING OUT FORMS TO SEND PATH SPECIMEN TO [facility] FOR PCR FOR AFB, FUNGAL BACTERIAL BY 16S RIBOSOMAL SEQUENCING

## 2015-03-27 ENCOUNTER — Telehealth: Payer: Self-pay | Admitting: Licensed Clinical Social Worker

## 2015-03-27 NOTE — Telephone Encounter (Signed)
Patient's husband called stating that he couldn't get her medication for TB until Monday. Patient was discharged yesterday from the hospital with prescriptions. Per Dr. Tommy Medal TB nurse from the health department is scheduled to come out to her home to deliver medications. Patient verbalized understanding.

## 2015-04-04 ENCOUNTER — Emergency Department (HOSPITAL_COMMUNITY)
Admission: EM | Admit: 2015-04-04 | Discharge: 2015-04-04 | Disposition: A | Discharge status: Home / Self Care | Payer: 59 | Attending: Emergency Medicine | Admitting: Emergency Medicine

## 2015-04-04 ENCOUNTER — Encounter (HOSPITAL_COMMUNITY): Payer: Self-pay | Admitting: Emergency Medicine

## 2015-04-04 ENCOUNTER — Emergency Department (HOSPITAL_COMMUNITY): Payer: 59

## 2015-04-04 DIAGNOSIS — Z79899 Other long term (current) drug therapy: Secondary | ICD-10-CM | POA: Diagnosis not present

## 2015-04-04 DIAGNOSIS — R1013 Epigastric pain: Secondary | ICD-10-CM | POA: Diagnosis not present

## 2015-04-04 DIAGNOSIS — M791 Myalgia: Secondary | ICD-10-CM | POA: Insufficient documentation

## 2015-04-04 DIAGNOSIS — Z8679 Personal history of other diseases of the circulatory system: Secondary | ICD-10-CM | POA: Diagnosis not present

## 2015-04-04 DIAGNOSIS — R079 Chest pain, unspecified: Secondary | ICD-10-CM | POA: Diagnosis not present

## 2015-04-04 DIAGNOSIS — Z862 Personal history of diseases of the blood and blood-forming organs and certain disorders involving the immune mechanism: Secondary | ICD-10-CM | POA: Diagnosis not present

## 2015-04-04 DIAGNOSIS — Z791 Long term (current) use of non-steroidal anti-inflammatories (NSAID): Secondary | ICD-10-CM | POA: Insufficient documentation

## 2015-04-04 DIAGNOSIS — Z8639 Personal history of other endocrine, nutritional and metabolic disease: Secondary | ICD-10-CM | POA: Diagnosis not present

## 2015-04-04 DIAGNOSIS — Z8611 Personal history of tuberculosis: Secondary | ICD-10-CM | POA: Diagnosis not present

## 2015-04-04 LAB — HEPATIC FUNCTION PANEL
ALT: 15 U/L (ref 0–35)
AST: 29 U/L (ref 0–37)
Albumin: 2.2 g/dL — ABNORMAL LOW (ref 3.5–5.2)
Alkaline Phosphatase: 73 U/L (ref 39–117)
Total Bilirubin: 0.2 mg/dL — ABNORMAL LOW (ref 0.3–1.2)
Total Protein: 7.7 g/dL (ref 6.0–8.3)

## 2015-04-04 LAB — BASIC METABOLIC PANEL
Anion gap: 11 (ref 5–15)
BUN: 6 mg/dL (ref 6–23)
CALCIUM: 9 mg/dL (ref 8.4–10.5)
CHLORIDE: 100 mmol/L (ref 96–112)
CO2: 23 mmol/L (ref 19–32)
Creatinine, Ser: 0.47 mg/dL — ABNORMAL LOW (ref 0.50–1.10)
GFR calc Af Amer: >90 mL/min (ref >90)
GFR calc non Af Amer: >90 mL/min (ref >90)
GLUCOSE: 123 mg/dL — AB (ref 70–99)
Potassium: 3.8 mmol/L (ref 3.5–5.1)
Sodium: 134 mmol/L — ABNORMAL LOW (ref 135–145)

## 2015-04-04 LAB — CBC
HCT: 28.4 % — ABNORMAL LOW (ref 36.0–46.0)
Hemoglobin: 9.1 g/dL — ABNORMAL LOW (ref 12.0–15.0)
MCH: 26.7 pg (ref 26.0–34.0)
MCHC: 32 g/dL (ref 30.0–36.0)
MCV: 83.3 fL (ref 78.0–100.0)
Platelets: 503 10*3/uL — ABNORMAL HIGH (ref 150–400)
RBC: 3.41 MIL/uL — ABNORMAL LOW (ref 3.87–5.11)
RDW: 16.6 % — AB (ref 11.5–15.5)
WBC: 4.3 10*3/uL (ref 4.0–10.5)

## 2015-04-04 LAB — I-STAT TROPONIN, ED: Troponin i, poc: 0 ng/mL (ref 0.00–0.08)

## 2015-04-04 LAB — I-STAT BETA HCG BLOOD, ED (MC, WL, AP ONLY): I-stat hCG, quantitative: <5 m[IU]/mL (ref <5)

## 2015-04-04 LAB — LIPASE, BLOOD: Lipase: 32 U/L (ref 11–59)

## 2015-04-04 LAB — BRAIN NATRIURETIC PEPTIDE: B Natriuretic Peptide: 10.4 pg/mL (ref 0.0–100.0)

## 2015-04-04 MED ORDER — RANITIDINE HCL 150 MG PO CAPS: 150.0000 mg | ORAL_CAPSULE | Freq: Every day | ORAL | Status: DC

## 2015-04-04 NOTE — ED Provider Notes (Signed)
CSN: [id number]     Arrival date & time 04/04/15  0108 History  This chart was scribed for Nichole Perry by Nichole Perry. This patient was seen in room D35C/D35C and the patient's care was started 2:30 AM AM.    Chief Complaint  Patient presents with  . Chest Pain   The history is provided by the patient. A language interpreter was used.    HPI Comments:  Laural Perry is a 44 y.o. female who presents to the Emergency Department complaining of intermittent epigastric pain that started yesterday (4/22) night. She notes her pain radiates to the throat. She has not noticed an exacerbation of pain after eating, but the husband states that there is pain after eating. She denies pain at this time. She also reports pain to her bilateral knees for about 1 month. She denies nausea, vomiting and swelling to her BLE.  She also denies h/o similar pain, MI and recent long distances trips.  No alleviating factors noted. Pt was diagnosed with TB on 03/14/15; she denied having symptoms but was placed on antibiotics which she completed.     Past Medical History  Diagnosis Date  . Anemia   . Goiter 02/07/2012  . Tuberculosis   . 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 Back surgery    . Axillary lymph node biopsy Right 03/20/2015    Procedure: RIGHT AXILLARY LYMPH NODE BIOPSY;  Surgeon: Nichole Overall, Perry;  Location: [facility];  Service: General;  Laterality: Right;   Family History  Problem Relation Age of Onset  . Hypertension Mother   . Diabetes Father    History  Substance Use Topics  . Smoking status: Never Smoker   . Smokeless tobacco: Never Used  . Alcohol Use: No   OB History    Gravida Para Term Preterm AB TAB SAB Ectopic Multiple Living   0 0 0 0 0 0 0 0 0 0      Review of Systems  Constitutional: Negative for fever and chills.  Cardiovascular: Negative for leg swelling.  Gastrointestinal: Positive for abdominal pain.  Negative for nausea and vomiting.  Musculoskeletal: Positive for myalgias and arthralgias.  All other systems reviewed and are negative.     Allergies  Review of patient's allergies indicates no known allergies.  Home Medications   Prior to Admission medications   Medication Sig Start Date End Date Taking? Authorizing Provider  acetaminophen (TYLENOL) 500 MG tablet Take 500 mg by mouth every 6 (six) hours as needed for fever.    Historical Provider, Perry  ethambutol (MYAMBUTOL) 100 MG tablet Take 8.5 tablets (850 mg total) by mouth daily. 03/26/15   Nichole Dumas, Perry  furosemide (LASIX) 20 MG tablet Take 1 tablet (20 mg total) by mouth daily. 02/21/15   Nichole Honour, Perry  isoniazid (NYDRAZID) 300 MG tablet Take 1 tablet (300 mg total) by mouth daily. 03/26/15   Nichole Dumas, Perry  meloxicam (MOBIC) 15 MG tablet Take 1 tablet (15 mg total) by mouth daily. 03/12/15   Nichole Honour, Perry  pyrazinamide 500 MG tablet Take 2 tablets (1,000 mg total) by mouth daily. 03/26/15   Nichole Dumas, Perry  pyridOXINE (B-6) 50 MG tablet Take 1 tablet (50 mg total) by mouth daily. 03/26/15   Nichole Dumas, Perry  rifampin (RIFADIN) 300 MG capsule Take 2 capsules (600 mg total) by mouth daily. 03/26/15   Nichole Dumas, Perry  VITAMIN D, CHOLECALCIFEROL, PO Take  1 capsule by mouth daily.    Historical Provider, Perry   BP 114/74 mmHg  Pulse 108  Temp(Src) 98.4 F (36.9 C) (Oral)  Resp 16  Ht 5\' 1"  (1.549 m)  Wt 116 lb (52.617 kg)  BMI 21.93 kg/m2  SpO2 98%  LMP 11/19/2014 Physical Exam  Constitutional: She is oriented to person, place, and time. She appears well-developed and well-nourished. No distress.  HENT:  Head: Normocephalic and atraumatic.  Nose: Nose normal.  Mouth/Throat: Oropharynx is clear and moist. No oropharyngeal exudate.  Eyes: Conjunctivae and EOM are normal. Pupils are equal, round, and reactive to light. No scleral icterus.  Neck: Normal range of motion. Neck supple. No JVD present. No tracheal  deviation present. No thyromegaly present.  Cardiovascular: Normal rate, regular rhythm and normal heart sounds.  Exam reveals no gallop and no friction rub.   No murmur heard. Pulmonary/Chest: Effort normal and breath sounds normal. No respiratory distress. She has no wheezes. She exhibits no tenderness.  Abdominal: Soft. Bowel sounds are normal. She exhibits no distension and no mass. There is no tenderness. There is no rebound and no guarding.  Musculoskeletal: Normal range of motion. She exhibits no edema or tenderness.  Lymphadenopathy:    She has no cervical adenopathy.  Neurological: She is alert and oriented to person, place, and time. No cranial nerve deficit. She exhibits normal muscle tone.  Skin: Skin is warm and dry. No rash noted. No erythema. No pallor.  Nursing note and vitals reviewed.   ED Course  Procedures   DIAGNOSTIC STUDIES:  Oxygen Saturation is 98% on RA, normal by my interpretation.    COORDINATION OF CARE:  2:37 AM  Will discharge pt with pain meds. Discussed treatment plan with pt and family at bedside and pt agreed to plan.  Labs Review Labs Reviewed  CBC - Abnormal; Notable for the following:    RBC 3.41 (*)    Hemoglobin 9.1 (*)    HCT 28.4 (*)    RDW 16.6 (*)    Platelets 503 (*)    All other components within normal limits  BASIC METABOLIC PANEL - Abnormal; Notable for the following:    Sodium 134 (*)    Glucose, Bld 123 (*)    Creatinine, Ser 0.47 (*)    All other components within normal limits  HEPATIC FUNCTION PANEL - Abnormal; Notable for the following:    Albumin 2.2 (*)    Total Bilirubin 0.2 (*)    All other components within normal limits  BRAIN NATRIURETIC PEPTIDE  LIPASE, BLOOD  I-STAT TROPOININ, ED  I-STAT BETA HCG BLOOD, ED ([facility], [facility], [facility] ONLY)    Imaging Review Dg Chest 2 View  04/04/2015   CLINICAL DATA:  Intermittent central chest pain and mild shortness of breath. Initial encounter.  EXAM: CHEST  2 VIEW  COMPARISON:   Chest radiograph from 03/23/2015  FINDINGS: The lungs are well-aerated. Scarring is noted at the lung bases, somewhat more prominent on the right. Small bilateral pleural effusions are seen. There is no evidence of pneumothorax. Bilateral nipple shadows are seen.  The heart is borderline enlarged. No acute osseous abnormalities are seen.  IMPRESSION: Scarring at the lung bases, somewhat more prominent on the right. Small bilateral pleural effusions noted. Borderline cardiomegaly.   Electronically Signed   By: Garald Balding M.D.   On: 04/04/2015 02:14     EKG Interpretation   Date/Time:  Saturday April 04 2015 01:21:19 EDT Ventricular Rate:  103 PR Interval:  148 QRS Duration: 64 QT Interval:  334 QTC Calculation: 437 R Axis:   69 Text Interpretation:  Sinus tachycardia Cannot rule out Anterior infarct ,  age undetermined Abnormal ECG Confirmed by Glynn Octave [phone])  on 04/04/2015 1:57:32 AM      MDM   Final diagnoses:  None    Patient since emergency department for chest pain. When she points to her body it is actually located in the mid epigastric area, reflective of abdominal pain. Patient is described as a burning sensation that radiates to her throat. This is likely increase acid buildup in her stomach. She is currently asymptomatic. We'll discharge the patient home with ranitidine treatment. She has also had pain behind bilateral knees for one month. This is not a new complaint and does not require emergent diagnostics. Physical exam is completely unremarkable of the bilateral lower extremities. Troponin was obtained by triage and was not clinically indicated in this case. Her vital signs were within her normal limits and she is safe for discharge with primary care follow-up within 3 days.  I personally performed the services described in this documentation, which was scribed in my presence. The recorded information has been reviewed and is accurate.    Nichole Balls,  Perry 04/04/15 1606

## 2015-04-04 NOTE — ED Notes (Signed)
Patient transported to X-ray 

## 2015-04-04 NOTE — Discharge Instructions (Signed)
Chest Pain (Nonspecific) Nichole Perry, take medication as prescribed for your pain and see a primary care physician within 3 days for follow-up. If symptoms worsen come back to emergency department immediately. Thank you.                 3     .          . . Claudean Kinds mustafaa , tanawal alddiwa' kama wasafah l 'almak w ruyat tabib alrrieayat al'awwaliat khilal 3 'ayam min ajl almutabaea . 'iidha sa'at al'aerad 'aeud 'iilaa qism alttawari ealaa alfawr . shukra.  It is often hard to give a diagnosis for the cause of chest pain. There is always a chance that your pain could be related to something serious, such as a heart attack or a blood clot in the lungs. You need to follow up with your doctor. HOME CARE  If antibiotic medicine was given, take it as directed by your doctor. Finish the medicine even if you start to feel better.  For the next few days, avoid activities that bring on chest pain. Continue physical activities as told by your doctor.  Do not use any tobacco products. This includes cigarettes, chewing tobacco, and e-cigarettes.  Avoid drinking alcohol.  Only take medicine as told by your doctor.  Follow your doctor's suggestions for more testing if your chest pain does not go away.  Keep all doctor visits you made. GET HELP IF:  Your chest pain does not go away, even after treatment.  You have a rash with blisters on your chest.  You have a fever. GET HELP RIGHT AWAY IF:   You have more pain or pain that spreads to your arm, neck, jaw, back, or belly (abdomen).  You have shortness of breath.  You cough more than usual or cough up blood.  You have very bad back or belly pain.  You feel sick to your stomach (nauseous) or throw up (vomit).  You have very bad weakness.  You pass out (faint).  You have chills. This is an emergency. Do not wait to  see if the problems will go away. Call your local emergency services (911 in [place] Do not drive yourself to the hospital. MAKE SURE YOU:   Understand these instructions.  Will watch your condition.  Will get help right away if you are not doing well or get worse. Document Released: 05/16/2008 Document Revised: 12/03/2013 Document Reviewed: 05/16/2008 [facility] Patient Information 2015 [place], [place]. This information is not intended to replace advice given to you by your health care provider. Make sure you discuss any questions you have with your health care provider.

## 2015-04-04 NOTE — ED Notes (Signed)
No isolation precaution need per Dr. Claudine Mouton.

## 2015-04-04 NOTE — ED Notes (Signed)
Pt. reports intermittent central chest pain onset today with mild SOB , denies nausea or diaphoresis .

## 2015-04-07 ENCOUNTER — Encounter (HOSPITAL_COMMUNITY): Payer: Self-pay

## 2015-04-14 ENCOUNTER — Ambulatory Visit
Admission: RE | Admit: 2015-04-14 | Discharge: 2015-04-14 | Disposition: A | Discharge status: Home / Self Care | Payer: 59 | Source: Ambulatory Visit | Attending: Family Medicine | Admitting: Family Medicine

## 2015-04-14 DIAGNOSIS — J90 Pleural effusion, not elsewhere classified: Secondary | ICD-10-CM

## 2015-04-14 DIAGNOSIS — R590 Localized enlarged lymph nodes: Secondary | ICD-10-CM

## 2015-04-15 LAB — AFB CULTURE WITH SMEAR (NOT AT ARMC): Acid Fast Smear: NONE SEEN

## 2015-04-16 LAB — FUNGUS CULTURE W SMEAR
Fungal Smear: NONE SEEN
SPECIAL REQUESTS: NORMAL

## 2015-04-17 LAB — FUNGUS CULTURE W SMEAR: Fungal Smear: NONE SEEN

## 2015-04-20 ENCOUNTER — Telehealth: Payer: Self-pay | Admitting: Infectious Disease

## 2015-04-20 NOTE — Telephone Encounter (Signed)
Patient is on bi weekly basis now per the TB nurse at [facility]. Her name is Nichole Perry [phone]

## 2015-04-20 NOTE — Telephone Encounter (Signed)
Perfect

## 2015-04-20 NOTE — Telephone Encounter (Signed)
Received call from Dr Amil Amen re this pt he was seeing her for her polyarthritis.  He was concerned based on conversations with the husband as to whether the patient was actually getting the TB meds more than once per week?  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

## 2015-04-29 ENCOUNTER — Inpatient Hospital Stay: Payer: 59 | Admitting: Infectious Disease

## 2015-05-01 LAB — AFB CULTURE WITH SMEAR (NOT AT ARMC)
Acid Fast Smear: NONE SEEN
Special Requests: NORMAL

## 2015-05-06 ENCOUNTER — Ambulatory Visit (INDEPENDENT_AMBULATORY_CARE_PROVIDER_SITE_OTHER): Payer: 59 | Admitting: Infectious Disease

## 2015-05-06 ENCOUNTER — Encounter: Payer: Self-pay | Admitting: Infectious Disease

## 2015-05-06 VITALS — BP 113/72 | HR 101 | Temp 98.2°F | Wt 118.5 lb

## 2015-05-06 DIAGNOSIS — A1889 Tuberculosis of other sites: Secondary | ICD-10-CM | POA: Diagnosis not present

## 2015-05-06 DIAGNOSIS — I881 Chronic lymphadenitis, except mesenteric: Secondary | ICD-10-CM

## 2015-05-06 DIAGNOSIS — M2550 Pain in unspecified joint: Secondary | ICD-10-CM | POA: Diagnosis not present

## 2015-05-06 DIAGNOSIS — J90 Pleural effusion, not elsewhere classified: Secondary | ICD-10-CM

## 2015-05-06 DIAGNOSIS — I888 Other nonspecific lymphadenitis: Secondary | ICD-10-CM

## 2015-05-06 DIAGNOSIS — M130 Polyarthritis, unspecified: Secondary | ICD-10-CM

## 2015-05-06 HISTORY — DX: Polyarthritis, unspecified: M13.0

## 2015-05-06 NOTE — Progress Notes (Signed)
Subjective:    Patient ID: Nichole Perry, female    DOB: 1971-08-21, 44 y.o.   MRN: [id number]  HPI  44 y.o. female from [place] who began having fevers, chills and sweats a little over 3 weeks prior to admission to [facility] in April. She is also noted migratory joint swelling and pain.She first began in both feet and both knees.She is also had shortness of breath and was found to have bilateral pleural effusions on chest x-ray and CT scan. There was some atelectasis in the bases but no underlying infiltrates were noted. She underwent thoracentesis revealing exudative effusions. Gram stain and AFB stain were negative. Routine cultures were negative and AFB cultures are negative to date.Her Quantiferon TB assay was also positive. Pleural fluid cytology showed reactive mesothelial cells. She also underwent a biopsy of her thyroid gland that was benign as well on the 22nd.   She had been admitted by PCP after febrile and blood cultures + with Coag negative staph though in only 1 culture.  She continued to have pain in both knees as well as her ankles. She had a dry nonproductive cough.  She has several nonspecific inflammatory markers are elevated including a sedimentation rate above 100. 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 Interestingly the pan-PCR sent to [facility] was negative for MTB DNA by PCR but patient DID eventually grow MTB from lymph node.  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 He called me to discuss case and I was agreeable to short course of low dose steroids (prednisone) but patient has had ZERO improvement in her arthritic symptoms. Currently she  has signfiicant pain, swelling and warmth in right wrist along with bilteral knee pain and warmth. Her breathing and cough have improved and fever has long since abated. She has intense pruritis of her right forearm and excoriation there.  Review of Systems  Constitutional: Negative for fever, chills, diaphoresis, activity change, appetite change, fatigue and unexpected weight change.  HENT: Negative for congestion, rhinorrhea, sinus pressure, sneezing, sore throat and trouble swallowing.   Eyes: Negative for photophobia and visual disturbance.  Respiratory: Negative for cough, chest tightness, shortness of breath, wheezing and stridor.   Cardiovascular: Negative for chest pain, palpitations and leg swelling.  Gastrointestinal: Negative for nausea, vomiting, abdominal pain, diarrhea, constipation, blood in stool, abdominal distention and anal bleeding.  Genitourinary: Negative for dysuria, hematuria, flank pain and difficulty urinating.  Musculoskeletal: Positive for myalgias, joint swelling and arthralgias. Negative for back pain and gait problem.  Skin: Positive for color change and rash. Negative for pallor and wound.  Neurological: Negative for dizziness, tremors, weakness and light-headedness.  Hematological: Negative for adenopathy. Does not bruise/bleed easily.  Psychiatric/Behavioral: Negative for behavioral problems, confusion, sleep disturbance, dysphoric mood, decreased concentration and agitation.       Objective:   Physical Exam  Constitutional: She is oriented to person, place, and time. She appears well-developed and well-nourished. No distress.  HENT:  Head: Normocephalic and atraumatic.  Mouth/Throat: No oropharyngeal exudate.  Eyes: Conjunctivae and EOM are normal. No scleral icterus.  Neck: Normal range of motion. Neck supple.  Cardiovascular: Normal rate, regular rhythm and normal heart sounds.  Exam reveals  no gallop and no friction rub.   No murmur  heard. Pulmonary/Chest: Effort normal and breath sounds normal. No respiratory distress. She has no wheezes. She has no rales. She exhibits no tenderness.  Abdominal: Soft. Bowel sounds are normal. She exhibits no distension. There is no tenderness.  Musculoskeletal: She exhibits tenderness.       Right wrist: She exhibits decreased range of motion, tenderness, swelling and effusion.       Right knee: She exhibits swelling. Tenderness found.       Left knee: She exhibits swelling. Tenderness found.       Arms:      Legs: Neurological: She is alert and oriented to person, place, and time. She exhibits normal muscle tone. Coordination normal.  Skin: Skin is warm and dry. She is not diaphoretic.     Psychiatric: She has a normal mood and affect. Her behavior is normal. Judgment and thought content normal.          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 Not sure that there is much else to do besides continue therapy and support her through her symptoms with supportive therapy --her arthritic ssx have NOT improved with systemic steroids so I will ask that she now stop them --if ssx worsen can image with MRI involved joints  I spent greater than 40 minutes with the patient including greater than 50% of time in face to face counsel of the patient and her husband via [employer] farm manager and in coordination of their care.   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 She is not improving on oral prednisone and I do not want to have her take further steroids without first consulting with [facility], Jeremy Johann and possibly Brigitte Pulse at [facility]. It would seem rather unusual for her to have an underlying CTD that suddenly manifested itself just as the MTB was, unless this is something she was under-reporting before which seems  unlikely.  As above can image with MRI if not improving much  Symptomatic treatment on NSAID and low dose hydrocodone by Dr. Debbora Dus  Pruritic rash: zyrtec and weak potency topical steroid

## 2015-05-07 ENCOUNTER — Telehealth: Payer: Self-pay | Admitting: *Deleted

## 2015-05-07 NOTE — Telephone Encounter (Signed)
Levander Campion, [facility] Department TB RN, called requesting last office note be faxed to [phone].  Done. Landis Gandy, RN

## 2015-05-20 ENCOUNTER — Encounter (HOSPITAL_COMMUNITY): Payer: Self-pay

## 2015-05-20 ENCOUNTER — Inpatient Hospital Stay (HOSPITAL_COMMUNITY)
Admission: AD | Admit: 2015-05-20 | Discharge: 2015-05-21 | DRG: 812 | Disposition: A | Discharge status: Home / Self Care | Payer: 59 | Source: Ambulatory Visit | Attending: Cardiovascular Disease | Admitting: Cardiovascular Disease

## 2015-05-20 ENCOUNTER — Inpatient Hospital Stay (HOSPITAL_COMMUNITY): Payer: 59

## 2015-05-20 DIAGNOSIS — D509 Iron deficiency anemia, unspecified: Principal | ICD-10-CM | POA: Diagnosis present

## 2015-05-20 DIAGNOSIS — Z7952 Long term (current) use of systemic steroids: Secondary | ICD-10-CM | POA: Diagnosis not present

## 2015-05-20 DIAGNOSIS — R0902 Hypoxemia: Secondary | ICD-10-CM | POA: Diagnosis present

## 2015-05-20 DIAGNOSIS — E8809 Other disorders of plasma-protein metabolism, not elsewhere classified: Secondary | ICD-10-CM | POA: Diagnosis present

## 2015-05-20 DIAGNOSIS — A150 Tuberculosis of lung: Secondary | ICD-10-CM | POA: Diagnosis present

## 2015-05-20 DIAGNOSIS — Z79899 Other long term (current) drug therapy: Secondary | ICD-10-CM | POA: Diagnosis not present

## 2015-05-20 DIAGNOSIS — R0602 Shortness of breath: Secondary | ICD-10-CM

## 2015-05-20 DIAGNOSIS — R600 Localized edema: Secondary | ICD-10-CM | POA: Diagnosis present

## 2015-05-20 DIAGNOSIS — M130 Polyarthritis, unspecified: Secondary | ICD-10-CM | POA: Diagnosis present

## 2015-05-20 HISTORY — DX: Shortness of breath: R06.02

## 2015-05-20 HISTORY — DX: Tuberculosis of other sites: A18.89

## 2015-05-20 LAB — COMPREHENSIVE METABOLIC PANEL
ALT: 12 U/L — AB (ref 14–54)
ANION GAP: 12 (ref 5–15)
AST: 19 U/L (ref 15–41)
Albumin: 2.5 g/dL — ABNORMAL LOW (ref 3.5–5.0)
Alkaline Phosphatase: 66 U/L (ref 38–126)
BILIRUBIN TOTAL: 0.5 mg/dL (ref 0.3–1.2)
BUN: 9 mg/dL (ref 6–20)
CO2: 23 mmol/L (ref 22–32)
CREATININE: 0.47 mg/dL (ref 0.44–1.00)
Calcium: 8.8 mg/dL — ABNORMAL LOW (ref 8.9–10.3)
Chloride: 101 mmol/L (ref 101–111)
GFR calc Af Amer: >60 mL/min (ref >60)
Glucose, Bld: 92 mg/dL (ref 65–99)
Potassium: 3.8 mmol/L (ref 3.5–5.1)
SODIUM: 136 mmol/L (ref 135–145)
Total Protein: 7.6 g/dL (ref 6.5–8.1)

## 2015-05-20 LAB — D-DIMER, QUANTITATIVE: D-Dimer, Quant: 8.16 ug/mL-FEU — ABNORMAL HIGH (ref 0.00–0.48)

## 2015-05-20 LAB — CBC WITH DIFFERENTIAL/PLATELET
BASOS PCT: 1 % (ref 0–1)
Basophils Absolute: 0 10*3/uL (ref 0.0–0.1)
Eosinophils Absolute: 0.2 10*3/uL (ref 0.0–0.7)
Eosinophils Relative: 6 % — ABNORMAL HIGH (ref 0–5)
HCT: 27 % — ABNORMAL LOW (ref 36.0–46.0)
HEMOGLOBIN: 8.6 g/dL — AB (ref 12.0–15.0)
LYMPHS ABS: 1.3 10*3/uL (ref 0.7–4.0)
Lymphocytes Relative: 39 % (ref 12–46)
MCH: 26.4 pg (ref 26.0–34.0)
MCHC: 31.9 g/dL (ref 30.0–36.0)
MCV: 82.8 fL (ref 78.0–100.0)
MONO ABS: 0.3 10*3/uL (ref 0.1–1.0)
MONOS PCT: 10 % (ref 3–12)
NEUTROS ABS: 1.4 10*3/uL — AB (ref 1.7–7.7)
NEUTROS PCT: 44 % (ref 43–77)
PLATELETS: 434 10*3/uL — AB (ref 150–400)
RBC: 3.26 MIL/uL — AB (ref 3.87–5.11)
RDW: 16.5 % — ABNORMAL HIGH (ref 11.5–15.5)
WBC: 3.3 10*3/uL — AB (ref 4.0–10.5)

## 2015-05-20 LAB — APTT: aPTT: 36 seconds (ref 24–37)

## 2015-05-20 LAB — PROTIME-INR
INR: 1.34 (ref 0.00–1.49)
Prothrombin Time: 16.7 seconds — ABNORMAL HIGH (ref 11.6–15.2)

## 2015-05-20 LAB — TSH: TSH: 0.883 u[IU]/mL (ref 0.350–4.500)

## 2015-05-20 MED ORDER — SODIUM CHLORIDE 0.9 % IJ SOLN
3.0000 mL | Freq: Two times a day (BID) | INTRAMUSCULAR | Status: DC
  Administered 2015-05-20: 3 mL via INTRAVENOUS

## 2015-05-20 MED ORDER — NAPROXEN 250 MG PO TABS
500.0000 mg | ORAL_TABLET | Freq: Two times a day (BID) | ORAL | Status: DC
  Administered 2015-05-21: 500 mg via ORAL
  Filled 2015-05-20: qty 2

## 2015-05-20 MED ORDER — HEPARIN BOLUS VIA INFUSION
3000.0000 [IU] | Freq: Once | INTRAVENOUS | Status: AC
  Administered 2015-05-20: 3000 [IU] via INTRAVENOUS
  Filled 2015-05-20: qty 3000

## 2015-05-20 MED ORDER — IOHEXOL 350 MG/ML SOLN
80.0000 mL | Freq: Once | INTRAVENOUS | Status: AC | PRN
Start: 2015-05-20 — End: 2015-05-20
  Administered 2015-05-20: 80 mL via INTRAVENOUS

## 2015-05-20 MED ORDER — SODIUM CHLORIDE 0.9 % IV SOLN: 250.0000 mL | INTRAVENOUS | Status: DC | PRN

## 2015-05-20 MED ORDER — HYDROCODONE-ACETAMINOPHEN 5-325 MG PO TABS: 1.0000 | ORAL_TABLET | ORAL | Status: DC | PRN

## 2015-05-20 MED ORDER — ADULT MULTIVITAMIN W/MINERALS CH
1.0000 | ORAL_TABLET | Freq: Every day | ORAL | Status: DC
  Administered 2015-05-21: 1 via ORAL
  Filled 2015-05-20: qty 1

## 2015-05-20 MED ORDER — FERROUS SULFATE 325 (65 FE) MG PO TABS
325.0000 mg | ORAL_TABLET | Freq: Two times a day (BID) | ORAL | Status: DC
  Administered 2015-05-21: 325 mg via ORAL
  Filled 2015-05-20: qty 1

## 2015-05-20 MED ORDER — RIFAMPIN 300 MG PO CAPS
600.0000 mg | ORAL_CAPSULE | Freq: Every day | ORAL | Status: DC
  Administered 2015-05-21: 600 mg via ORAL
  Filled 2015-05-20: qty 2

## 2015-05-20 MED ORDER — FAMOTIDINE 20 MG PO TABS
20.0000 mg | ORAL_TABLET | Freq: Every day | ORAL | Status: DC
  Administered 2015-05-21: 20 mg via ORAL
  Filled 2015-05-20: qty 1

## 2015-05-20 MED ORDER — HEPARIN (PORCINE) IN NACL 100-0.45 UNIT/ML-% IJ SOLN
1250.0000 [IU]/h | INTRAMUSCULAR | Status: DC
  Administered 2015-05-20: 850 [IU]/h via INTRAVENOUS
  Filled 2015-05-20 (×3): qty 250

## 2015-05-20 MED ORDER — ETHAMBUTOL HCL 400 MG PO TABS
850.0000 mg | ORAL_TABLET | Freq: Every day | ORAL | Status: DC
  Administered 2015-05-21: 850 mg via ORAL
  Filled 2015-05-20: qty 0.5

## 2015-05-20 MED ORDER — SODIUM CHLORIDE 0.9 % IJ SOLN: 3.0000 mL | Freq: Two times a day (BID) | INTRAMUSCULAR | Status: DC

## 2015-05-20 MED ORDER — PYRAZINAMIDE 500 MG PO TABS
1000.0000 mg | ORAL_TABLET | Freq: Every day | ORAL | Status: DC
  Administered 2015-05-21: 1000 mg via ORAL
  Filled 2015-05-20: qty 2

## 2015-05-20 MED ORDER — DOCUSATE SODIUM 100 MG PO CAPS
100.0000 mg | ORAL_CAPSULE | Freq: Two times a day (BID) | ORAL | Status: DC
  Administered 2015-05-21: 100 mg via ORAL
  Filled 2015-05-20: qty 1

## 2015-05-20 MED ORDER — SODIUM CHLORIDE 0.9 % IJ SOLN: 3.0000 mL | INTRAMUSCULAR | Status: DC | PRN

## 2015-05-20 MED ORDER — ISONIAZID 300 MG PO TABS
300.0000 mg | ORAL_TABLET | Freq: Every day | ORAL | Status: DC
  Administered 2015-05-21: 300 mg via ORAL
  Filled 2015-05-20: qty 1

## 2015-05-20 MED ORDER — ONDANSETRON HCL 4 MG PO TABS: 4.0000 mg | ORAL_TABLET | Freq: Four times a day (QID) | ORAL | Status: DC | PRN

## 2015-05-20 MED ORDER — VITAMIN B-6 50 MG PO TABS
50.0000 mg | ORAL_TABLET | Freq: Every day | ORAL | Status: DC
  Administered 2015-05-21: 50 mg via ORAL
  Filled 2015-05-20: qty 1

## 2015-05-20 MED ORDER — ALUM & MAG HYDROXIDE-SIMETH 200-200-20 MG/5ML PO SUSP: 30.0000 mL | Freq: Four times a day (QID) | ORAL | Status: DC | PRN

## 2015-05-20 MED ORDER — ONDANSETRON HCL 4 MG/2ML IJ SOLN: 4.0000 mg | Freq: Four times a day (QID) | INTRAMUSCULAR | Status: DC | PRN

## 2015-05-20 NOTE — Progress Notes (Signed)
MD H&P note filed and reviewed by this RN. Pt noted to have extrapulmonary TB by MD. Charge RN notified. MD paged to ask about precautions, Airborne ordered as well as negative pressure room. Pt placed on airborne precautions. Report given to 5N RN where negative pressure room is available. Patient and family updated.

## 2015-05-20 NOTE — H&P (Signed)
Referring Physician:  Shanyce Daris is an 44 y.o. female.                       Chief Complaint: Shortness of breath  HPI: 44 year old female with extrapulmonary TB has exertional shortness of breath with significant drop in oxygen saturation from 94 % to 86 % with 30 feet walk.  She also has mild fever and polyarticular arthritis. No cough or chest pain.  Past Medical History  Diagnosis Date  . Anemia   . Goiter 02/07/2012  . Tuberculosis   . 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 Polyarticular arthritis 05/06/2015      Past Surgical History  Procedure Laterality Date  . Back surgery    . Axillary lymph node biopsy Right 03/20/2015    Procedure: RIGHT AXILLARY LYMPH NODE BIOPSY;  Surgeon: Alphonsa Overall, MD;  Location: [facility];  Service: General;  Laterality: Right;    Family History  Problem Relation Age of Onset  . Hypertension Mother   . Diabetes Father    Social History:  reports that she has never smoked. She has never used smokeless tobacco. She reports that she does not drink alcohol or use illicit drugs.  Allergies: No Known Allergies  Medications Prior to Admission  Medication Sig Dispense Refill  . acetaminophen (TYLENOL) 500 MG tablet Take 500 mg by mouth every 6 (six) hours as needed for fever.    . ethambutol (MYAMBUTOL) 100 MG tablet Take 8.5 tablets (850 mg total) by mouth daily. 30 tablet 5  . furosemide (LASIX) 20 MG tablet Take 1 tablet (20 mg total) by mouth daily. 30 tablet 0  . HYDROcodone-acetaminophen (NORCO/VICODIN) 5-325 MG per tablet Take by mouth.     . isoniazid (NYDRAZID) 300 MG tablet Take 1 tablet (300 mg total) by mouth daily. 60 tablet 2  . KLOR-CON M10 10 MEQ tablet Take 10 mEq by mouth daily.     . meloxicam (MOBIC) 15 MG tablet Take 1 tablet (15 mg total) by mouth daily. 30 tablet 0  . naproxen (NAPROSYN) 500 MG tablet Take 500 mg by mouth.     . predniSONE (DELTASONE) 5 MG tablet Take 10 mg by mouth daily.    . pyrazinamide 500 MG tablet  Take 2 tablets (1,000 mg total) by mouth daily. 30 tablet 2  . pyridOXINE (B-6) 50 MG tablet Take 1 tablet (50 mg total) by mouth daily. 30 tablet 2  . ranitidine (ZANTAC) 150 MG capsule Take 1 capsule (150 mg total) by mouth daily. 30 capsule 0  . rifampin (RIFADIN) 300 MG capsule Take 2 capsules (600 mg total) by mouth daily. 30 capsule 2  . VITAMIN D, CHOLECALCIFEROL, PO Take 1 capsule by mouth daily.      No results found for this or any previous visit (from the past 48 hour(s)). No results found.  Review Of Systems Constitutional: Negative for fever and chills.  Cardiovascular: Negative for leg swelling.  Gastrointestinal: Positive for abdominal pain. Negative for nausea and vomiting.  Musculoskeletal: Positive for myalgias and arthralgias.  All other systems reviewed and are negative.  Blood pressure 101/74, pulse 102, temperature 99.2 F (37.3 C), temperature source Oral, resp. rate 18, height 5\' 4"  (1.626 m), SpO2 100 %.  Physical Exam  Constitutional: She is oriented to person, place, and time. She appears averagely-developed and nourished. Mild respiratory distress.  HENT: Normocephalic and atraumatic. Nose: Nose normal. Mouth/Throat: Oropharynx is clear and moist. No oropharyngeal exudate.  Eyes:  Dark brown, Conjunctivae are pale and EOM are normal. Pupils are equal, round, and reactive to light. No scleral icterus.  Neck: Normal range of motion. Neck supple. No JVD present. No tracheal deviation present. No thyromegaly present.  Cardiovascular: Tachycardic, regular rhythm and normal heart sounds. Exam reveals no friction rub.No murmur heard. Pulmonary/Chest: Effort normal and breath sounds normal. Mild respiratory distress. She has no wheezes. She exhibits no tenderness.  Abdominal: Soft, non-distended and non-tender. Bowel sounds are normal.   Musculoskeletal: Normal range of motion. She exhibits mild bilateral wrists and feet edema with mild tenderness.  Lymphadenopathy:  She has no cervical adenopathy.  Neurological: She is alert and oriented to person, place, and time. No cranial nerve deficit. She exhibits normal muscle tone.  Skin: Skin is warm and dry. No rash noted. No erythema. + pallor.   Assessment/Plan Shortness of breath with exertion Hypoxia r/o PE Iron deficiency anemia Hypoalbuminemia Extrapulmonary TB Polyarthritis  Admit IV heparin/Labs/X-ray/Home medications CT chest-r/o PE Continue antitubercular drugs.  Birdie Riddle, MD  05/20/2015, 7:05 PM

## 2015-05-20 NOTE — Progress Notes (Signed)
ANTICOAGULATION CONSULT NOTE - Initial Consult  Pharmacy Consult for Heparin Indication: pulmonary embolus (rule out)  No Known Allergies  Patient Measurements: Height: 5\' 4"  (162.6 cm) IBW/kg (Calculated) : 54.7   Vital Signs: Temp: 99.2 F (37.3 C) (06/08 1540) Temp Source: Oral (06/08 1540) BP: 101/74 mmHg (06/08 1540) Pulse Rate: 102 (06/08 1540)  Labs: No results for input(s): HGB, HCT, PLT, APTT, LABPROT, INR, HEPARINUNFRC, CREATININE, CKTOTAL, CKMB, TROPONINI in the last 72 hours.  CrCl cannot be calculated (Unknown ideal weight.).   Medical History: Past Medical History  Diagnosis Date  . Anemia   . Goiter 02/07/2012  . Tuberculosis   . 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 Polyarticular arthritis 05/06/2015   Assessment: 44 year old female to begin heparin for rule out PE. Also receiving treatment for TB  Goal of Therapy:  Heparin level 0.3-0.7 units/ml Monitor platelets by anticoagulation protocol: Yes   Plan:  Heparin 3000 units x 1 bolus Heparin drip at 850 units / hr Heparin level 6 hours after heparin start Daily heparin level, CBC  Thank you. Anette Guarneri, PharmD [phone]  05/20/2015,6:38 PM

## 2015-05-21 ENCOUNTER — Inpatient Hospital Stay (HOSPITAL_COMMUNITY): Payer: 59

## 2015-05-21 LAB — BASIC METABOLIC PANEL
Anion gap: 9 (ref 5–15)
BUN: 10 mg/dL (ref 6–20)
CHLORIDE: 101 mmol/L (ref 101–111)
CO2: 25 mmol/L (ref 22–32)
Calcium: 8.9 mg/dL (ref 8.9–10.3)
Creatinine, Ser: 0.62 mg/dL (ref 0.44–1.00)
GFR calc Af Amer: >60 mL/min (ref >60)
GFR calc non Af Amer: >60 mL/min (ref >60)
GLUCOSE: 284 mg/dL — AB (ref 65–99)
Potassium: 4.2 mmol/L (ref 3.5–5.1)
Sodium: 135 mmol/L (ref 135–145)

## 2015-05-21 LAB — CBC
HCT: 28 % — ABNORMAL LOW (ref 36.0–46.0)
HEMOGLOBIN: 8.9 g/dL — AB (ref 12.0–15.0)
MCH: 26.6 pg (ref 26.0–34.0)
MCHC: 31.8 g/dL (ref 30.0–36.0)
MCV: 83.6 fL (ref 78.0–100.0)
Platelets: 431 10*3/uL — ABNORMAL HIGH (ref 150–400)
RBC: 3.35 MIL/uL — AB (ref 3.87–5.11)
RDW: 16.5 % — ABNORMAL HIGH (ref 11.5–15.5)
WBC: 4.3 10*3/uL (ref 4.0–10.5)

## 2015-05-21 LAB — APTT: aPTT: 50 seconds — ABNORMAL HIGH (ref 24–37)

## 2015-05-21 LAB — PROTIME-INR
INR: 1.36 (ref 0.00–1.49)
Prothrombin Time: 16.9 seconds — ABNORMAL HIGH (ref 11.6–15.2)

## 2015-05-21 LAB — HEPARIN LEVEL (UNFRACTIONATED)
Heparin Unfractionated: <0.1 IU/mL — ABNORMAL LOW (ref 0.30–0.70)
Heparin Unfractionated: 0.12 IU/mL — ABNORMAL LOW (ref 0.30–0.70)

## 2015-05-21 MED ORDER — HEPARIN BOLUS VIA INFUSION
2000.0000 [IU] | Freq: Once | INTRAVENOUS | Status: AC
  Administered 2015-05-21: 2000 [IU] via INTRAVENOUS
  Filled 2015-05-21: qty 2000

## 2015-05-21 MED ORDER — FERROUS SULFATE 325 (65 FE) MG PO TABS: 325.0000 mg | ORAL_TABLET | Freq: Two times a day (BID) | ORAL | Status: DC

## 2015-05-21 NOTE — Progress Notes (Signed)
ANTICOAGULATION CONSULT NOTE - F/U Consult  Pharmacy Consult for Heparin Indication: pulmonary embolus (rule out)  No Known Allergies  Patient Measurements: Height: 5\' 4"  (162.6 cm) Weight: 119 lb 4.3 oz (54.1 kg) IBW/kg (Calculated) : 54.7   Vital Signs: Temp: 97.8 F (36.6 C) (06/09 0632) Temp Source: Oral (06/09 9741) BP: 94/56 mmHg (06/09 6384) Pulse Rate: 77 (06/09 0632)  Labs:  Recent Labs  05/20/15 1925 05/21/15 0137 05/21/15 0822  HGB 8.6* 8.9*  --   HCT 27.0* 28.0*  --   PLT 434* 431*  --   APTT 36 50*  --   LABPROT 16.7* 16.9*  --   INR 1.34 1.36  --   HEPARINUNFRC  --  <0.10* 0.12*  CREATININE 0.47 0.62  --     Estimated Creatinine Clearance: 77.4 mL/min (by C-G formula based on Cr of 0.62).   Medical History: Past Medical History  Diagnosis Date  . Anemia   . Goiter 02/07/2012    "took a pill for that"  . 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 Extrapulmonary tuberculosis     Archie Endo 05/20/2015  . Exertional shortness of breath 05/20/2015   Assessment: 44 year old female to begin heparin for rule out PE. Patient had been subtherapeutic with previous HL. CTA negative for PE.  Currently on 1050 units/hr of heparin  Goal of Therapy:  Heparin level 0.3-0.7 units/ml Monitor platelets by anticoagulation protocol: Yes   Plan:  Heparin 2000 unit x 1 bolus Increase Heparin drip at 1250 units / hr Repeat HL in 6 hours Daily heparin level, CBC  Levester Fresh, PharmD, [facility] Clinical Pharmacist Pager [phone] 05/21/2015 12:33 PM

## 2015-05-21 NOTE — Progress Notes (Signed)
Inpatient Diabetes Program Recommendations  AACE/ADA: New Consensus Statement on Inpatient Glycemic Control (2013)  Target Ranges:  Prepandial:   less than 140 mg/dL      Peak postprandial:   less than 180 mg/dL (1-2 hours)      Critically ill patients:  140 - 180 mg/dL   Consider ordering CBGs achs.  Elevated lab glucose >200.  Thank you  Raoul Pitch BSN, RN,CDE Inpatient Diabetes Coordinator [phone] (team pager)

## 2015-05-21 NOTE — Discharge Summary (Signed)
Physician Discharge Summary  Patient ID: Nichole Perry MRN: [id number] DOB/AGE: Dec 08, 1971 44 y.o.  Admit date: 05/20/2015 Discharge date: 05/21/2015  Admission Diagnoses: Shortness of breath with exertion Hypoxia r/o PE Iron deficiency anemia Hypoalbuminemia Extrapulmonary TB Polyarthritis  Discharge Diagnoses:  Principal Problem:   Shortness of breath on exertion secondary to iron deficiency anemia Active Problems: Bilateral edema of lower extremity Shortness of breath with exertion Pulmonary Embolism ruled out Iron deficiency anemia Hypoalbuminemia Extrapulmonary TB Polyarthritis  Discharged Condition: fair  Hospital Course: 44 year old female with extrapulmonary TB has exertional shortness of breath with significant drop in oxygen saturation from 94 % to 86 % with 30 feet walk. She also has mild fever and polyarticular arthritis. No cough or chest pain. Her CT scan of chest was negative for PE and her pleural effusion has resolved. Her echocardiogram showed normal LV systolic function. Her oxygenation post 18 hours of IV heparin improved to 100 % on room air.  Curb side infectious disease consult recommended continuing anti-TB drugs. Her hypoxia could be from low Hgb from iron deficiency anemia. Her prednisone will be discontinued for hyperglycemia and Ferrous sulfate twice daily was started for iron deficiency anemia. She agreed to improve oral intake. She was discharged home in stable condition with follow up by primary care in 1 month and by me as needed.  Consults: None  Significant Diagnostic Studies: labs: Near normal CBC except low Hgb of 8.6.  Treatments: Ethambutol 100 mg. 8.5 tablets daily, Isoniazid 300 mg. daily, pyrazinamide 500 mg, two daily,.  Rifampin 300 mg. 2 capsule daily.  Discharge Exam: Blood pressure 94/56, pulse 77, temperature 97.8 F (36.6 C), temperature source Oral, resp. rate 17, height 5\' 4"  (1.626 m), weight 54.1 kg (119 lb 4.3 oz), SpO2 100  %. HENT: Normocephalic and atraumatic. Nose: Nose normal. Mouth/Throat: Oropharynx is clear and moist. No oropharyngeal exudate.  Eyes: Dark brown, Conjunctivae are pale and EOM are normal. Pupils are equal, round, and reactive to light. No scleral icterus.  Neck: Normal range of motion. Neck supple. No JVD present. No tracheal deviation present. No thyromegaly present.  Cardiovascular: Tachycardic, regular rhythm and normal heart sounds. Exam reveals no friction rub.No murmur heard. Pulmonary/Chest: Effort normal and breath sounds normal. Mild respiratory distress. She has no wheezes. She exhibits no tenderness.  Abdominal: Soft, non-distended and non-tender. Bowel sounds are normal.  Musculoskeletal: Normal range of motion. She exhibits mild bilateral wrists and feet edema with mild tenderness.  Lymphadenopathy: She has no cervical adenopathy.  Neurological: She is alert and oriented to person, place, and time. No cranial nerve deficit. She exhibits normal muscle tone.  Skin: Skin is warm and dry. No rash noted. No erythema. + pallor.   Disposition: 01-Home or Self Care     Medication List    STOP taking these medications        meloxicam 15 MG tablet  Commonly known as:  MOBIC     predniSONE 5 MG tablet  Commonly known as:  DELTASONE     ranitidine 150 MG capsule  Commonly known as:  ZANTAC      TAKE these medications        acetaminophen 500 MG tablet  Commonly known as:  TYLENOL  Take 500 mg by mouth every 6 (six) hours as needed for fever.     ethambutol 100 MG tablet  Commonly known as:  MYAMBUTOL  Take 8.5 tablets (850 mg total) by mouth daily.     ferrous sulfate 325 (  65 FE) MG tablet  Take 1 tablet (325 mg total) by mouth 2 (two) times daily with a meal.     furosemide 20 MG tablet  Commonly known as:  LASIX  Take 1 tablet (20 mg total) by mouth daily.     HYDROcodone-acetaminophen 5-325 MG per tablet  Commonly known as:  NORCO/VICODIN  Take 1-2  tablets by mouth every 6 (six) hours as needed for moderate pain or severe pain.     isoniazid 300 MG tablet  Commonly known as:  NYDRAZID  Take 1 tablet (300 mg total) by mouth daily.     KLOR-CON M10 10 MEQ tablet  Generic drug:  potassium chloride  Take 10 mEq by mouth daily.     naproxen 500 MG tablet  Commonly known as:  NAPROSYN  Take 500 mg by mouth every 12 (twelve) hours as needed for mild pain or moderate pain.     pyrazinamide 500 MG tablet  Take 2 tablets (1,000 mg total) by mouth daily.     pyridOXINE 50 MG tablet  Commonly known as:  B-6  Take 1 tablet (50 mg total) by mouth daily.     rifampin 300 MG capsule  Commonly known as:  RIFADIN  Take 2 capsules (600 mg total) by mouth daily.           Follow-up Information    Follow up with Maximino Greenland, MD. Schedule an appointment as soon as possible for a visit in 1 month.   Specialty:  Internal Medicine   Contact information:   [address] [place] [address] [phone]       Follow up with [facility] S, MD. Schedule an appointment as soon as possible for a visit in 1 month.   Specialty:  Cardiology   Contact information:   [place] [address] [phone]       Signed: Birdie Riddle 05/21/2015, 5:19 PM

## 2015-05-21 NOTE — Progress Notes (Signed)
ANTICOAGULATION CONSULT NOTE - Follow Up Consult  Pharmacy Consult for heparin Indication: r/o PE  Labs:  Recent Labs  05/20/15 1925 05/21/15 0137  HGB 8.6* 8.9*  HCT 27.0* 28.0*  PLT 434* 431*  APTT 36 50*  LABPROT 16.7* 16.9*  INR 1.34 1.36  HEPARINUNFRC  --  <0.10*  CREATININE 0.47 0.62     Assessment: 44yo female undetectable on heparin with initial dosing for possible PE (CT negative).  Goal of Therapy:  Heparin level 0.3-0.7 units/ml   Plan:  Will rebolus with heparin 2000 units x1 and increase gtt by 4 units/kg/hr to 1050 units/hr and check level in 6hr.  Wynona Neat, PharmD, BCPS  05/21/2015,2:18 AM

## 2015-05-21 NOTE — Progress Notes (Signed)
  Echocardiogram 2D Echocardiogram has been performed.  Adrien Dietzman FRANCES 05/21/2015, 3:25 PM

## 2015-05-21 NOTE — Progress Notes (Signed)
Nichole Perry to be D/C'd Home per MD order. Discussed with the patient and all questions fully answered.    Medication List    STOP taking these medications        meloxicam 15 MG tablet  Commonly known as:  MOBIC     predniSONE 5 MG tablet  Commonly known as:  DELTASONE     ranitidine 150 MG capsule  Commonly known as:  ZANTAC      TAKE these medications        acetaminophen 500 MG tablet  Commonly known as:  TYLENOL  Take 500 mg by mouth every 6 (six) hours as needed for fever.     ethambutol 100 MG tablet  Commonly known as:  MYAMBUTOL  Take 8.5 tablets (850 mg total) by mouth daily.     ferrous sulfate 325 (65 FE) MG tablet  Take 1 tablet (325 mg total) by mouth 2 (two) times daily with a meal.     furosemide 20 MG tablet  Commonly known as:  LASIX  Take 1 tablet (20 mg total) by mouth daily.     HYDROcodone-acetaminophen 5-325 MG per tablet  Commonly known as:  NORCO/VICODIN  Take 1-2 tablets by mouth every 6 (six) hours as needed for moderate pain or severe pain.     isoniazid 300 MG tablet  Commonly known as:  NYDRAZID  Take 1 tablet (300 mg total) by mouth daily.     KLOR-CON M10 10 MEQ tablet  Generic drug:  potassium chloride  Take 10 mEq by mouth daily.     naproxen 500 MG tablet  Commonly known as:  NAPROSYN  Take 500 mg by mouth every 12 (twelve) hours as needed for mild pain or moderate pain.     pyrazinamide 500 MG tablet  Take 2 tablets (1,000 mg total) by mouth daily.     pyridOXINE 50 MG tablet  Commonly known as:  B-6  Take 1 tablet (50 mg total) by mouth daily.     rifampin 300 MG capsule  Commonly known as:  RIFADIN  Take 2 capsules (600 mg total) by mouth daily.        VVS, Skin clean, dry and intact without evidence of skin break down, no evidence of skin tears noted.  IV catheter discontinued intact. Site without signs and symptoms of complications. Dressing and pressure applied.  An After Visit Summary was printed and given to  the patient.  Patient escorted via [place], and D/C home via private auto.  Cyndra Numbers  05/21/2015 7:01 PM

## 2015-05-21 NOTE — Care Management Utilization Note (Signed)
Utilization review completed by Cristalle Rohm N. Jasiel Belisle, RN BSN 

## 2015-06-23 LAB — AFB CULTURE WITH SMEAR (NOT AT ARMC): Acid Fast Smear: NONE SEEN

## 2015-07-06 ENCOUNTER — Ambulatory Visit: Payer: 59 | Admitting: Infectious Disease

## 2015-08-04 ENCOUNTER — Ambulatory Visit: Payer: 59 | Admitting: Infectious Disease

## 2015-10-02 ENCOUNTER — Other Ambulatory Visit: Payer: Self-pay | Admitting: Infectious Disease

## 2015-10-02 ENCOUNTER — Ambulatory Visit
Admission: RE | Admit: 2015-10-02 | Discharge: 2015-10-02 | Disposition: A | Discharge status: Home / Self Care | Payer: No Typology Code available for payment source | Source: Ambulatory Visit | Attending: Infectious Disease | Admitting: Infectious Disease

## 2015-10-02 DIAGNOSIS — A150 Tuberculosis of lung: Secondary | ICD-10-CM

## 2016-03-28 ENCOUNTER — Other Ambulatory Visit: Payer: Self-pay | Admitting: Cardiovascular Disease

## 2016-03-28 ENCOUNTER — Ambulatory Visit
Admission: RE | Admit: 2016-03-28 | Discharge: 2016-03-28 | Disposition: A | Discharge status: Home / Self Care | Payer: 59 | Source: Ambulatory Visit | Attending: Cardiovascular Disease | Admitting: Cardiovascular Disease

## 2016-03-28 DIAGNOSIS — R0602 Shortness of breath: Secondary | ICD-10-CM

## 2016-04-06 ENCOUNTER — Other Ambulatory Visit: Payer: Self-pay | Admitting: Cardiovascular Disease

## 2017-03-02 IMAGING — CR DG CHEST 2V
2 series · 2 of 2 positions shown · non-contrast
Comparison: 12/05/2014

CLINICAL DATA: Chest pain and shortness of breath. Negative
pregnancy test.

EXAM:
CHEST  2 VIEW

[PA]
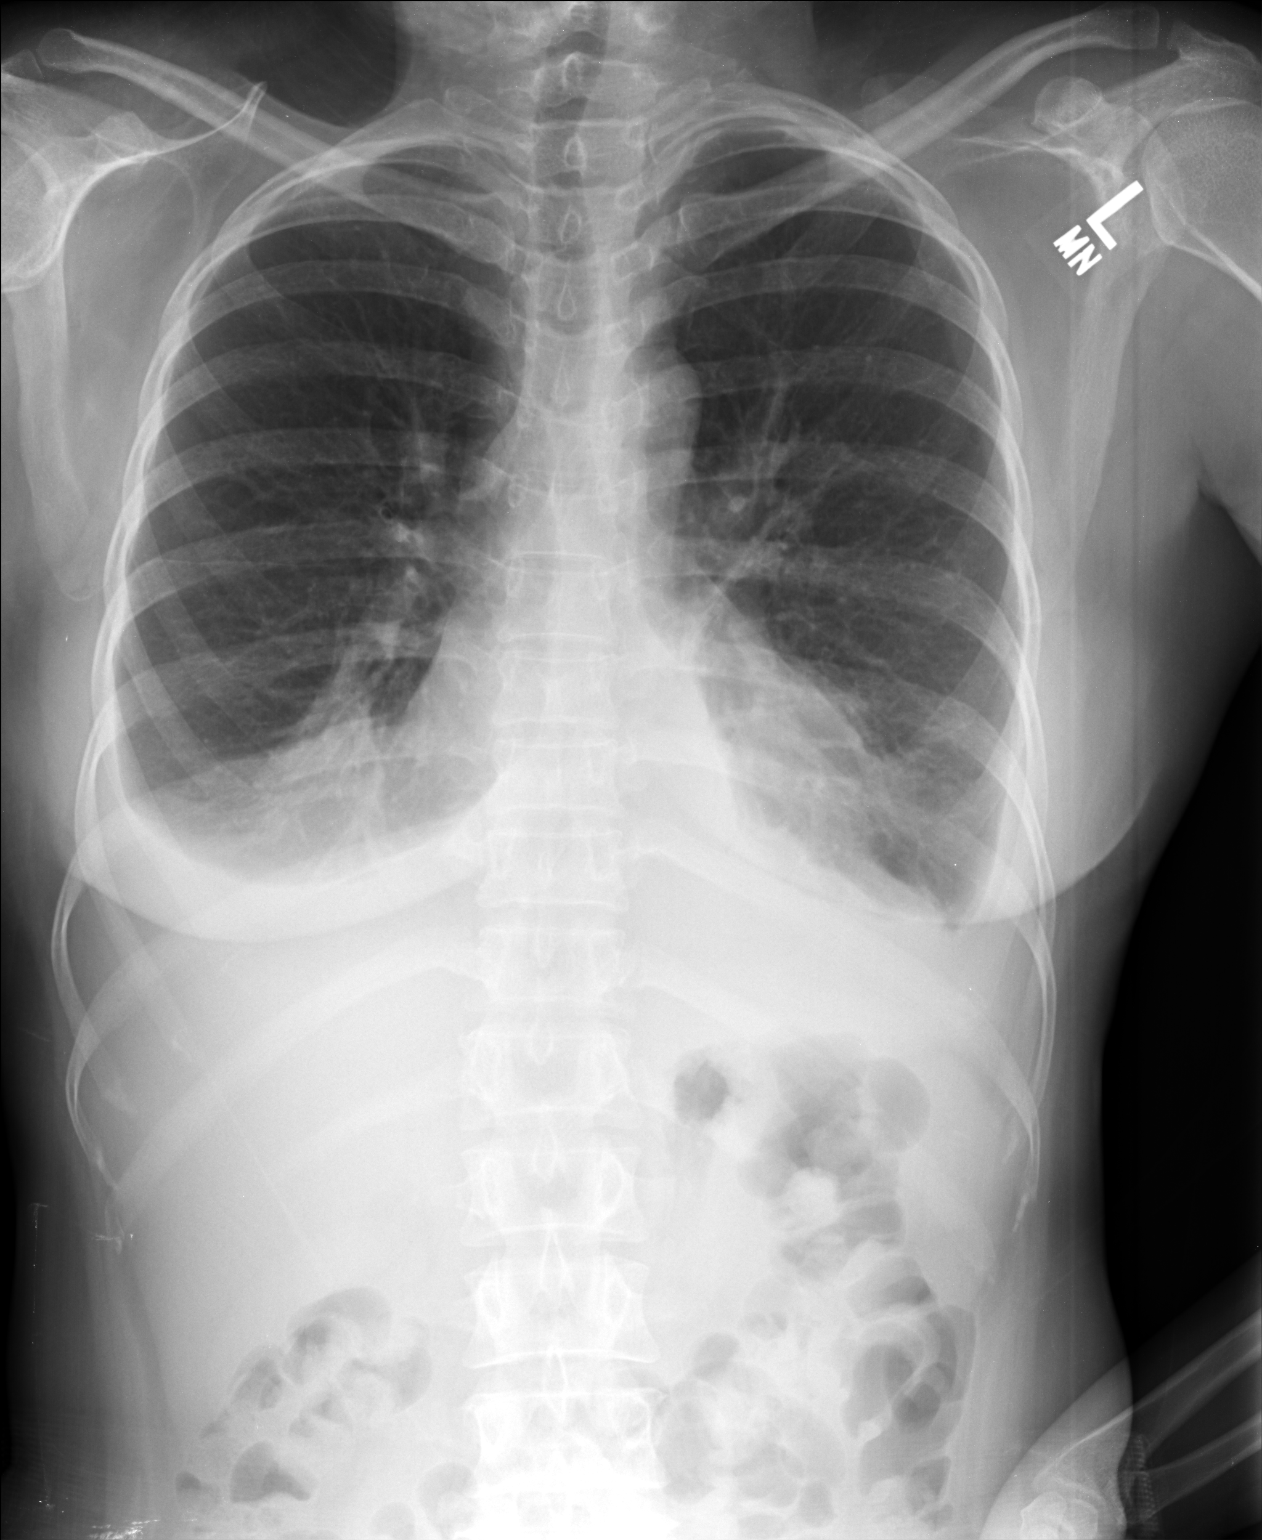

[lateral]
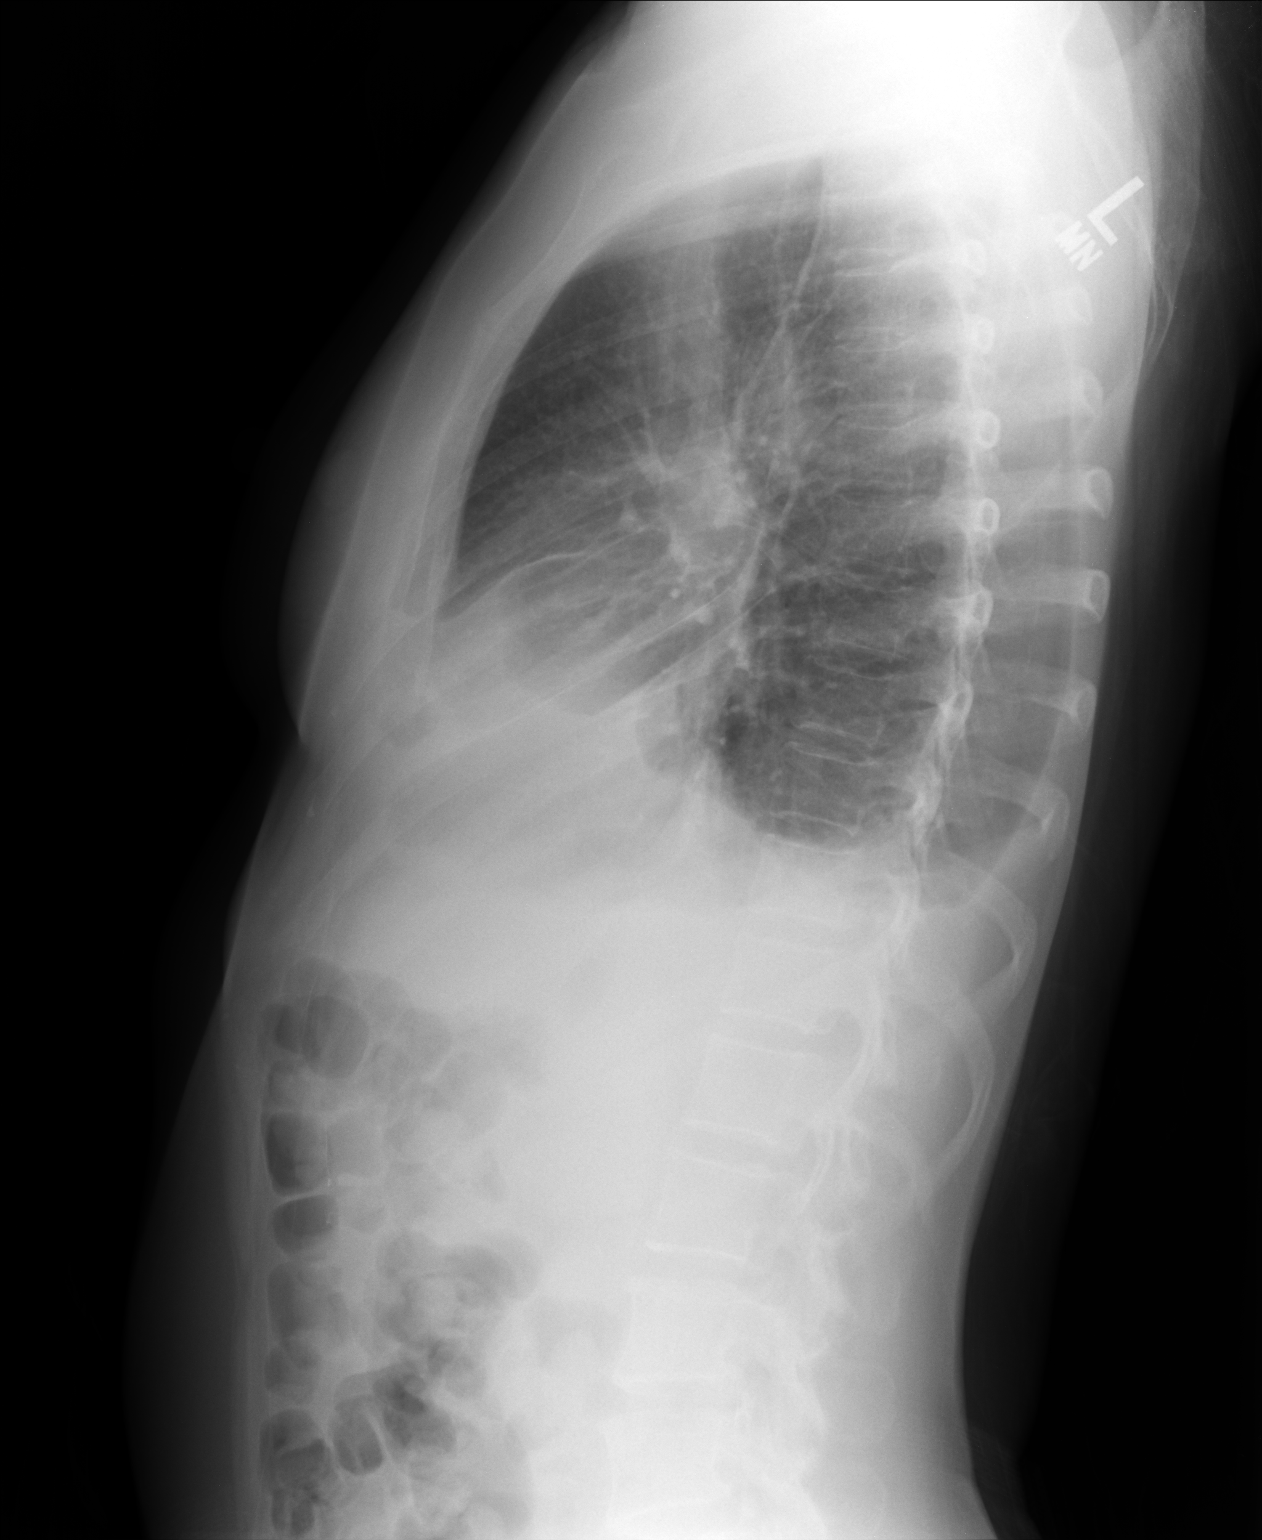

[2 of 2 positions shown; findings below may reference images not displayed]

FINDINGS: Lungs are adequately inflated and demonstrate small to moderate
bilateral pleural effusions right greater than left which are new.
Likely associated atelectasis in the lung bases as basilar infection
is also possible person cardiomediastinal silhouette and remainder
of the exam is unchanged.
IMPRESSION: Small to moderate bilateral pleural effusions right greater than
left. Likely associated bibasilar atelectasis versus infection.

## 2017-04-13 IMAGING — DX DG CHEST 2V
2 series · 2 of 2 positions shown · non-contrast
Comparison: Chest radiograph from 03/23/2015

CLINICAL DATA: Intermittent central chest pain and mild shortness
of breath. Initial encounter.

EXAM:
CHEST  2 VIEW

[chest pa]
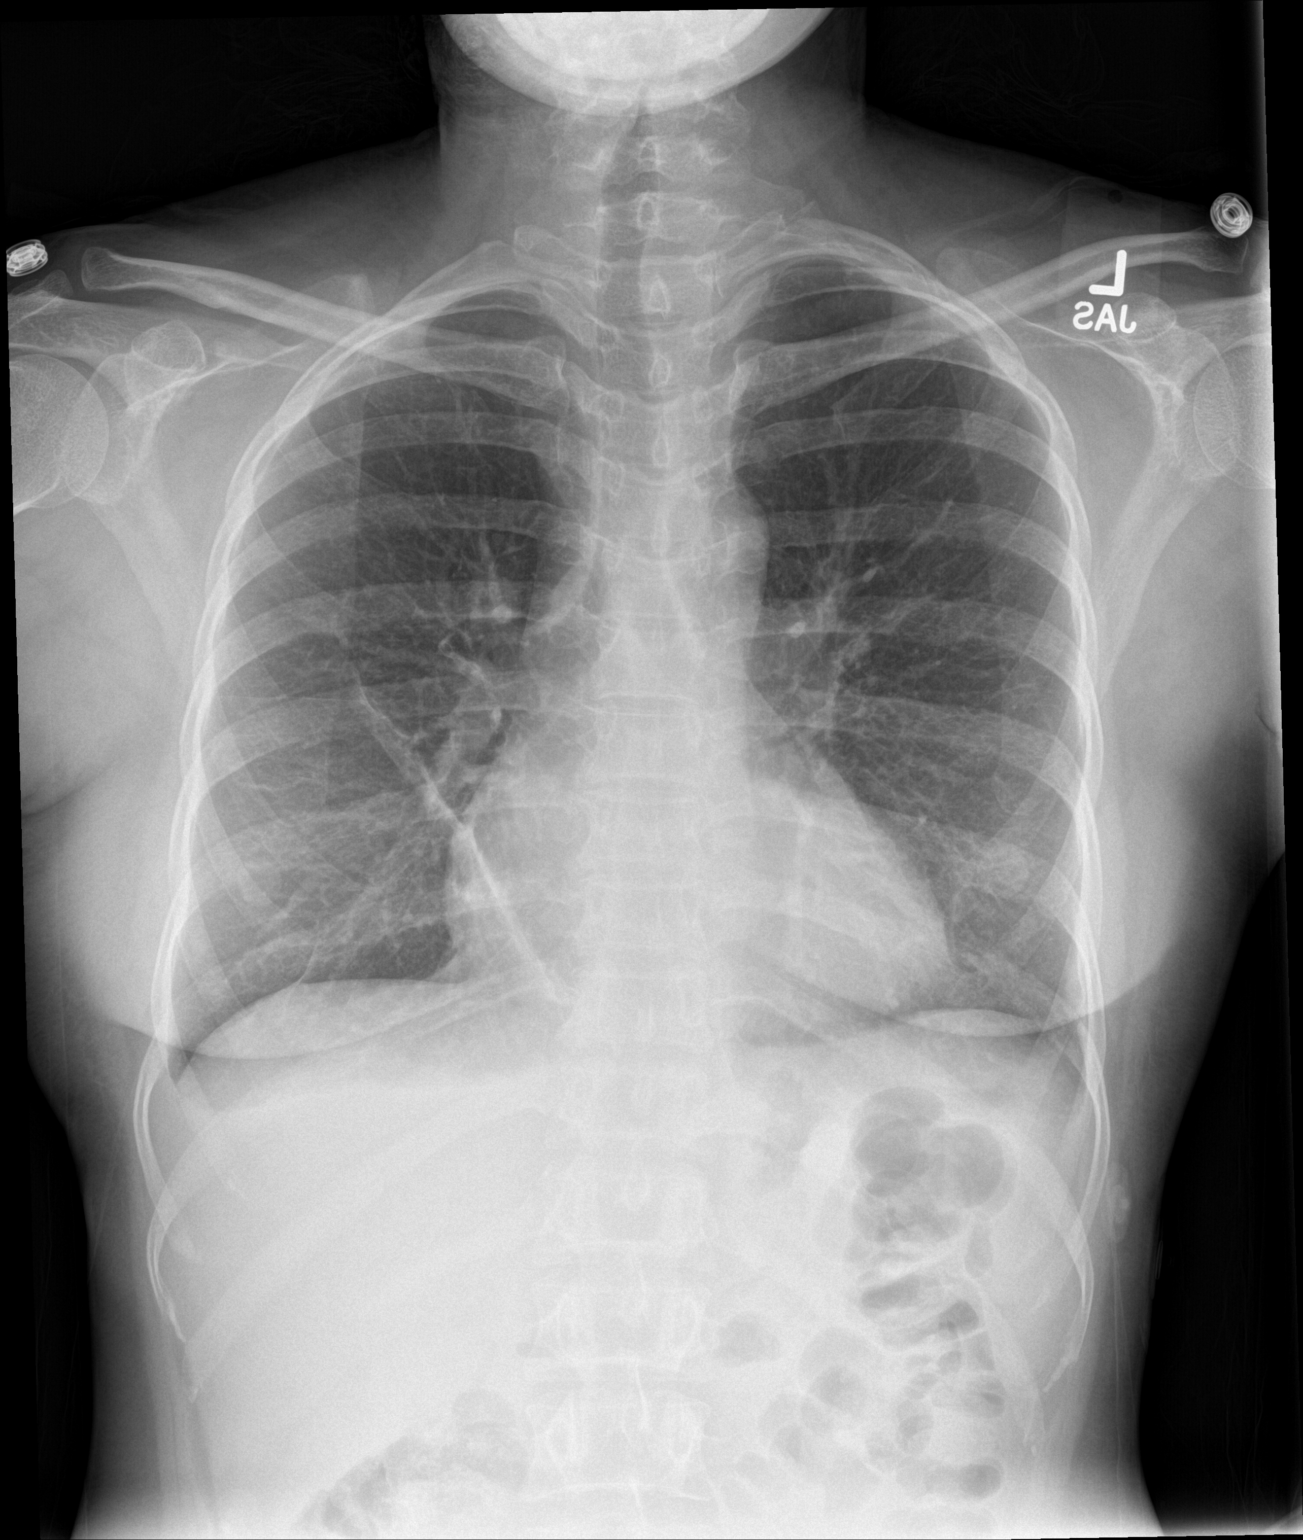

[chest lat]
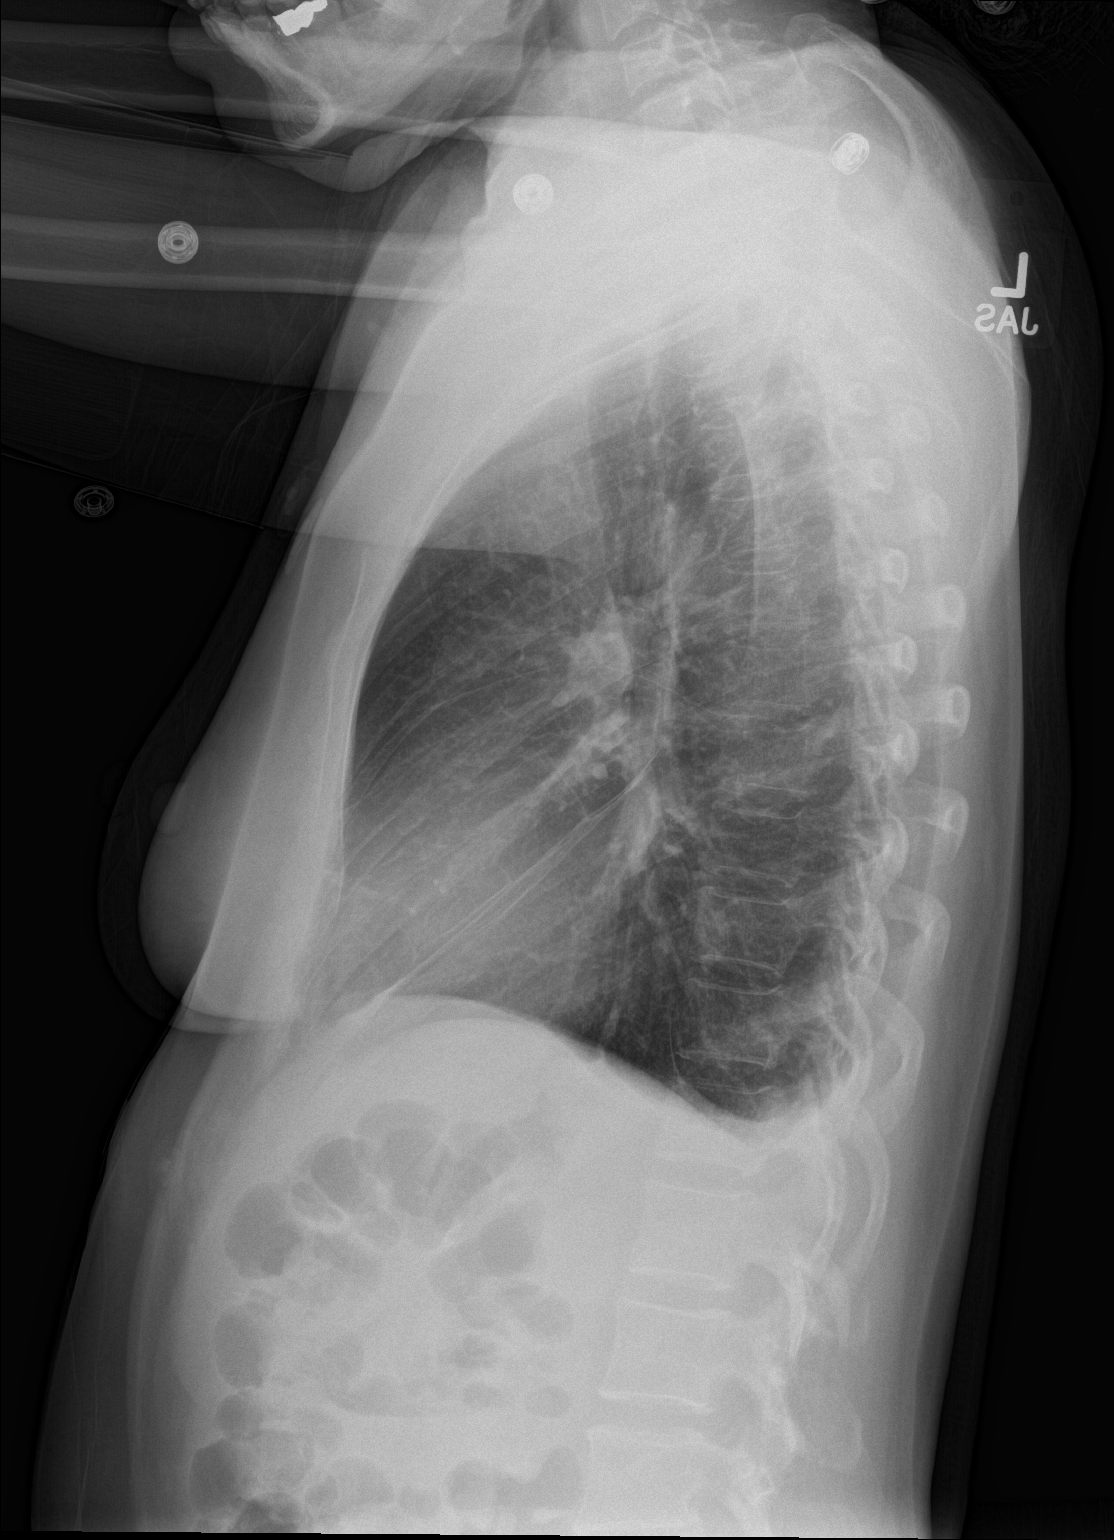

[2 of 2 positions shown; findings below may reference images not displayed]

FINDINGS: The lungs are well-aerated. Scarring is noted at the lung bases,
somewhat more prominent on the right. Small bilateral pleural
effusions are seen. There is no evidence of pneumothorax. Bilateral
nipple shadows are seen.

The heart is borderline enlarged. No acute osseous abnormalities are
seen.
IMPRESSION: Scarring at the lung bases, somewhat more prominent on the right.
Small bilateral pleural effusions noted. Borderline cardiomegaly.

## 2018-04-13 ENCOUNTER — Ambulatory Visit (INDEPENDENT_AMBULATORY_CARE_PROVIDER_SITE_OTHER): Payer: Managed Care, Other (non HMO) | Admitting: Emergency Medicine

## 2018-04-13 ENCOUNTER — Encounter: Payer: Self-pay | Admitting: Emergency Medicine

## 2018-04-13 ENCOUNTER — Other Ambulatory Visit: Payer: Self-pay

## 2018-04-13 VITALS — BP 92/66 | HR 93 | Temp 98.4°F | Resp 16 | Ht 61.0 in | Wt 132.2 lb

## 2018-04-13 DIAGNOSIS — J069 Acute upper respiratory infection, unspecified: Secondary | ICD-10-CM | POA: Insufficient documentation

## 2018-04-13 DIAGNOSIS — H6593 Unspecified nonsuppurative otitis media, bilateral: Secondary | ICD-10-CM

## 2018-04-13 MED ORDER — AZITHROMYCIN 250 MG PO TABS: ORAL_TABLET | ORAL | 0 refills | Status: DC

## 2018-04-13 NOTE — Progress Notes (Signed)
[facility] 47 y.o.   Chief Complaint  Patient presents with  . Cough    nonproductive x 2 weeks  . Ear Problem    per patient ears are blocked and numbness in feet    HISTORY OF PRESENT ILLNESS: [name] complaining of flulike symptoms with cough for 2 weeks followed by bilateral ear congestion.  No other significant symptoms  HPI   Prior to Admission medications   Medication Sig Start Date End Date Taking? Authorizing Provider  acetaminophen (TYLENOL) 500 MG tablet Take 500 mg by mouth every 6 (six) hours as needed for fever.   Yes [provider]  ethambutol (MYAMBUTOL) 100 MG tablet Take 8.5 tablets (850 mg total) by mouth daily. Patient not taking: Reported on 05/20/2015 03/26/15   Reyne Dumas, MD  ferrous sulfate 325 (65 FE) MG tablet Take 1 tablet (325 mg total) by mouth 2 (two) times daily with a meal. 05/21/15   Dixie Dials, MD  furosemide (LASIX) 20 MG tablet Take 1 tablet (20 mg total) by mouth daily. Patient not taking: Reported on 04/13/2018 02/21/15   Wardell Honour, MD  HYDROcodone-acetaminophen (NORCO/VICODIN) 5-325 MG per tablet Take 1-2 tablets by mouth every 6 (six) hours as needed for moderate pain or severe pain.  04/09/15   [provider]  isoniazid (NYDRAZID) 300 MG tablet Take 1 tablet (300 mg total) by mouth daily. Patient not taking: Reported on 05/20/2015 03/26/15   Reyne Dumas, MD  KLOR-CON M10 10 MEQ tablet Take 10 mEq by mouth daily.  04/09/15   [provider]  naproxen (NAPROSYN) 500 MG tablet Take 500 mg by mouth every 12 (twelve) hours as needed for mild pain or moderate pain.  04/20/15   [provider]  pyrazinamide 500 MG tablet Take 2 tablets (1,000 mg total) by mouth daily. Patient not taking: Reported on 05/20/2015 03/26/15   Reyne Dumas, MD  pyridOXINE (B-6) 50 MG tablet Take 1 tablet (50 mg total) by mouth daily. Patient not taking: Reported on 05/20/2015 03/26/15   Reyne Dumas, MD  rifampin  (RIFADIN) 300 MG capsule Take 2 capsules (600 mg total) by mouth daily. Patient not taking: Reported on 05/20/2015 03/26/15   Reyne Dumas, MD    No Known Allergies  Patient Active Problem List   Diagnosis Date Noted  . Shortness of breath on exertion 05/20/2015  . 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 Effusion into joint   . Extrapulmonary tuberculosis   . Elevated d-dimer   . SOB (shortness of breath)   . Tuberculosis   . Bacteremia   . Knee effusion   . Pleural effusion   . Fever 03/14/2015  . Coagulase negative Staphylococcus bacteremia 03/14/2015  . Sepsis ([place]) 03/14/2015  . 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  . 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 Bilateral pleural effusion 03/07/2015  . Arthralgia 03/07/2015  . Amenorrhea 03/07/2015  . Thyroid nodule 03/07/2015  . LAD (lymphadenopathy), axillary 03/07/2015  . Bilateral edema of lower extremity 03/07/2015  . Infertility, female 05/03/2013  . Other and unspecified ovarian cyst 05/03/2013  . Goiter 02/07/2012  . Vitamin D deficiency 02/07/2012  . Leucopenia 02/07/2012    Past Medical History:  Diagnosis Date  . Anemia   . Exertional shortness of breath 05/20/2015  . Extrapulmonary tuberculosis    Archie Endo 05/20/2015  . Goiter 02/07/2012   "took a pill for that"  . 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 AXILLARY LYMPH NODE BIOPSY Right 03/20/2015   Procedure: RIGHT AXILLARY LYMPH NODE BIOPSY;  Surgeon: Alphonsa Overall, MD;  Location: [facility];  Service: General;  Laterality: Right;  . BACK SURGERY  ~ 1983   "cartilage"    Social History   Socioeconomic History  . Marital status: Married    Spouse name: Not on file  . Number of children: 0  . Years of education: Not on file  . Highest education level: Not on file  Occupational History  . Not on file  Social Needs  . Financial  resource strain: Not on file  . Food insecurity:    Worry: Not on file    Inability: Not on file  . Transportation needs:    Medical: Not on file    Non-medical: Not on file  Tobacco Use  . Smoking status: Never Smoker  . Smokeless tobacco: Never Used  Substance and Sexual Activity  . Alcohol use: No  . Drug use: No  . Sexual activity: Yes    Partners: Male    Birth control/protection: None  Lifestyle  . Physical activity:    Days per week: Not on file    Minutes per session: Not on file  . Stress: Not on file  Relationships  . Social connections:    Talks on phone: Not on file    Gets together: Not on file    Attends religious service: Not on file    Active member of club or organization: Not on file    Attends meetings of clubs or organizations: Not on file    Relationship status: Not on file  . Intimate partner violence:    Fear of current or ex partner: Not on file    Emotionally abused: Not on file    Physically abused: Not on file    Forced sexual activity: Not on file  Other Topics Concern  . Not on file  Social History Narrative   From [place].    Family History  Problem Relation Age of Onset  . Hypertension Mother   . Diabetes Father      Review of Systems  Constitutional: Negative.  Negative for chills and fever.  HENT: Positive for congestion and ear pain. Negative for sore throat.   Eyes: Negative.   Respiratory: Positive for cough.   Cardiovascular: Negative.  Negative for chest pain, claudication and leg swelling.  Gastrointestinal: Negative.  Negative for abdominal pain, diarrhea, nausea and vomiting.  Genitourinary: Negative.  Negative for dysuria and hematuria.  Musculoskeletal: Negative.  Negative for back pain, myalgias and neck pain.  Skin: Negative.  Negative for rash.  Neurological: Negative.  Negative for dizziness and headaches.  Endo/Heme/Allergies: Negative.     Vitals:   04/13/18 1655  BP: 92/66  Pulse: 93  Resp: 16  Temp:  98.4 F (36.9 C)  SpO2: 97%    Physical Exam  Constitutional: She is oriented to person, place, and time. She appears well-developed and well-nourished.  HENT:  Head: Normocephalic and atraumatic.  Right Ear: Tympanic membrane is injected. A middle ear effusion is present.  Left Ear: Tympanic membrane is injected. A middle ear effusion is present.  Nose: Nose normal.  Mouth/Throat: Oropharynx is clear and moist.  Eyes: Pupils are equal, round, and reactive to light. Conjunctivae and EOM are normal.  Neck: Normal range of motion. Neck supple.  Cardiovascular: Normal rate, regular rhythm and normal heart sounds.  Pulmonary/Chest: Effort normal  and breath sounds normal. She has no wheezes. She has no rales.  Musculoskeletal: Normal range of motion.  Lymphadenopathy:    She has no cervical adenopathy.  Neurological: She is alert and oriented to person, place, and time. No sensory deficit. She exhibits normal muscle tone.  Skin: Skin is warm and dry. Capillary refill takes less than 2 seconds.  Psychiatric: She has a normal mood and affect. Her behavior is normal.  Vitals reviewed.   A total of 25 minutes was spent in the room with the patient, greater than 50% of which was in counseling/coordination of care regarding differential diagnosis, treatment, medications, and need for follow-up.  ASSESSMENT & PLAN:  Nichole Perry was seen today for cough and ear problem.  Diagnoses and all orders for this visit:  Acute upper respiratory infection  Bilateral otitis media with effusion -     azithromycin (ZITHROMAX) 250 MG tablet; Sig as indicated    Patient Instructions       IF you received an x-ray today, you will receive an invoice from [facility]. Please contact [facility] at [phone] with questions or concerns regarding your invoice.   IF you received labwork today, you will receive an invoice from [facility]. Please contact [facility] at [phone] with  questions or concerns regarding your invoice.   Our billing staff will not be able to assist you with questions regarding bills from these companies.  You will be contacted with the lab results as soon as they are available. The fastest way to get your results is to activate your My Chart account. Instructions are located on the last page of this paperwork. If you have not heard from [place] regarding the results in 2 weeks, please contact this office.     Otitis Media, Adult Otitis media is redness, soreness, and puffiness (swelling) in the space just behind your eardrum (middle ear). It may be caused by allergies or infection. It often happens along with a cold. Follow these instructions at home:  Take your medicine as told. Finish it even if you start to feel better.  Only take over-the-counter or prescription medicines for pain, discomfort, or fever as told by your doctor.  Follow up with your doctor as told. Contact a doctor if:  You have otitis media only in one ear, or bleeding from your nose, or both.  You notice a lump on your neck.  You are not getting better in 3-5 days.  You feel worse instead of better. Get help right away if:  You have pain that is not helped with medicine.  You have puffiness, redness, or pain around your ear.  You get a stiff neck.  You cannot move part of your face (paralysis).  You notice that the bone behind your ear hurts when you touch it. This information is not intended to replace advice given to you by your health care provider. Make sure you discuss any questions you have with your health care provider. Document Released: 05/16/2008 Document Revised: 05/05/2016 Document Reviewed: 06/25/2013 Elsevier Interactive Patient Education  2017 Elsevier Inc.     Agustina Caroli, MD [facility]

## 2018-04-13 NOTE — Patient Instructions (Addendum)
     IF you received an x-ray today, you will receive an invoice from [facility]. Please contact [facility] at [phone] with questions or concerns regarding your invoice.   IF you received labwork today, you will receive an invoice from [facility]. Please contact [facility] at [phone] with questions or concerns regarding your invoice.   Our billing staff will not be able to assist you with questions regarding bills from these companies.  You will be contacted with the lab results as soon as they are available. The fastest way to get your results is to activate your My Chart account. Instructions are located on the last page of this paperwork. If you have not heard from us regarding the results in 2 weeks, please contact this office.     Otitis Media, Adult Otitis media is redness, soreness, and puffiness (swelling) in the space just behind your eardrum (middle ear). It may be caused by allergies or infection. It often happens along with a cold. Follow these instructions at home:  Take your medicine as told. Finish it even if you start to feel better.  Only take over-the-counter or prescription medicines for pain, discomfort, or fever as told by your doctor.  Follow up with your doctor as told. Contact a doctor if:  You have otitis media only in one ear, or bleeding from your nose, or both.  You notice a lump on your neck.  You are not getting better in 3-5 days.  You feel worse instead of better. Get help right away if:  You have pain that is not helped with medicine.  You have puffiness, redness, or pain around your ear.  You get a stiff neck.  You cannot move part of your face (paralysis).  You notice that the bone behind your ear hurts when you touch it. This information is not intended to replace advice given to you by your health care provider. Make sure you discuss any questions you have with your health care provider. Document Released:  05/16/2008 Document Revised: 05/05/2016 Document Reviewed: 06/25/2013 Elsevier Interactive Patient Education  2017 Elsevier Inc.  

## 2019-12-10 ENCOUNTER — Ambulatory Visit: Payer: Managed Care, Other (non HMO) | Attending: Internal Medicine

## 2019-12-10 DIAGNOSIS — Z20822 Contact with and (suspected) exposure to covid-19: Secondary | ICD-10-CM

## 2019-12-11 LAB — NOVEL CORONAVIRUS, NAA: SARS-CoV-2, NAA: NOT DETECTED

## 2019-12-12 ENCOUNTER — Telehealth: Payer: Self-pay | Admitting: General Practice

## 2019-12-12 NOTE — Telephone Encounter (Signed)
Negative COVID results given. Patient results "NOT Detected." Caller expressed understanding. ° °

## 2022-09-12 ENCOUNTER — Encounter (HOSPITAL_COMMUNITY): Payer: Self-pay | Admitting: *Deleted

## 2022-09-12 ENCOUNTER — Other Ambulatory Visit: Payer: Self-pay

## 2022-09-12 ENCOUNTER — Ambulatory Visit (HOSPITAL_COMMUNITY)
Admission: EM | Admit: 2022-09-12 | Discharge: 2022-09-12 | Disposition: A | Discharge status: Home / Self Care | Payer: 59 | Attending: Family Medicine | Admitting: Family Medicine

## 2022-09-12 DIAGNOSIS — J3489 Other specified disorders of nose and nasal sinuses: Secondary | ICD-10-CM

## 2022-09-12 DIAGNOSIS — R519 Headache, unspecified: Secondary | ICD-10-CM

## 2022-09-12 DIAGNOSIS — J3089 Other allergic rhinitis: Secondary | ICD-10-CM

## 2022-09-12 MED ORDER — METHYLPREDNISOLONE 4 MG PO TBPK: ORAL_TABLET | ORAL | 0 refills | Status: DC

## 2022-09-12 NOTE — ED Triage Notes (Signed)
PT reports having a HA that makes her vomit  since yesterday and pt reports having seasonal .Pt reports Sx's yesterday.

## 2022-09-12 NOTE — Discharge Instructions (Addendum)
??? ??????? ????? ???? ??? ?????? ??????? ???????. ??? ????? ???? ????????? ???????? ?? ??? ?????. ??? ???? ??? ?? ????? ?????? ????? ?? ????? ?????? ?? ????? ??????? ?? ?????? ????? ?????? ??? ???? ??????? ?????? ???? ?? ??????? ?????????.   tamat muqabalatuk alyawm bisabab daght aljuyub al'anfiat walsudaei. laqad 'ursilat huzmat alsitirwid lilmusaeadat fi hadha alyawmi. wamae dhalika, 'iidha lam yatahasan alsudae ladayka, 'aw tafaqum al'almi, 'aw tafaqum alghathayan 'aw alqay', fayurjaa ZHQUIQNV 'iilaa ghurfat altawari li'iijra' mazid min altaqyim walaikhtibari.  You were seen today for sinus pressurea and headache.  I have sent out a steroid pack to help with this today.  However, if your headache does not improve, or you develop worsening pain, worsening nausea or vomiting, then please go to the ER for further evaluation and testing.

## 2022-09-12 NOTE — ED Provider Notes (Addendum)
[place]    CSN: [id number] Arrival date & time: 09/12/22  1016      History   Chief Complaint Chief Complaint  Patient presents with   Headache   Allergies    HPI Nichole Perry is a 51 y.o. female.   Arabic interpreter used today.  Patient is here for severe headache since yesterday.  Pain around the nasal area along with vomiting.  She has issues with her sinuses in the past, but this seems to have come back.  This is typical for her sinus issues.   Right now no drainage or congestion.  Just pain around the sinus area and the temples.  She felt feverish several days ago.  No chills.  She has taken motrin along with allergy medications without help.  + blurry vision, but she needs glasses and has not gotten those.            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   Effusion into joint    Extrapulmonary tuberculosis    Elevated d-dimer    SOB (shortness of breath)    Tuberculosis    Bacteremia    Knee effusion    Pleural effusion    Fever 03/14/2015   Coagulase negative Staphylococcus bacteremia 03/14/2015   Sepsis ([place]) 03/14/2015   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   Bilateral pleural effusion 03/07/2015   Arthralgia 03/07/2015   Amenorrhea 03/07/2015   Thyroid nodule 03/07/2015   LAD (lymphadenopathy), axillary 03/07/2015   Bilateral edema of lower extremity 03/07/2015   Infertility, female  05/03/2013   Other and unspecified ovarian cyst 05/03/2013   Goiter 02/07/2012   Vitamin D deficiency 02/07/2012   Leucopenia 02/07/2012    Past Surgical History:  Procedure Laterality Date   AXILLARY LYMPH NODE BIOPSY Right 03/20/2015   Procedure: RIGHT AXILLARY LYMPH NODE BIOPSY;  Surgeon: Alphonsa Overall, MD;  Location: [facility];  Service: General;  Laterality: Right;   BACK SURGERY  ~ 1983   "cartilage"    OB History     Gravida  0   Para  0   Term  0   Preterm  0   AB  0   Living  0      SAB  0   IAB  0   Ectopic  0   Multiple  0   Live Births               Home Medications    Prior to Admission medications   Medication Sig Start Date End Date Taking? Authorizing Provider  acetaminophen (TYLENOL) 500 MG tablet Take 500 mg by mouth every 6 (six) hours as needed for fever.    [provider]  azithromycin (ZITHROMAX) 250 MG tablet Sig as indicated 04/13/18   Horald Pollen, MD  ethambutol (MYAMBUTOL) 100 MG tablet Take 8.5 tablets (850 mg total) by mouth daily. Patient not taking: Reported on 05/20/2015 03/26/15   Reyne Dumas, MD  ferrous sulfate 325 (65 FE) MG tablet Take 1 tablet (325 mg total) by mouth 2 (two) times daily with a meal. 05/21/15   Dixie Dials, MD  furosemide (LASIX) 20 MG tablet Take 1 tablet (20 mg total) by mouth daily. Patient not taking: Reported on 04/13/2018 02/21/15   Wardell Honour, MD  HYDROcodone-acetaminophen (NORCO/VICODIN) 5-325 MG per tablet Take 1-2 tablets by mouth every 6 (six) hours as needed for moderate pain or severe pain.  04/09/15   [provider]  isoniazid (NYDRAZID) 300 MG tablet Take 1 tablet (300 mg total) by mouth daily. Patient not taking: Reported on 05/20/2015 03/26/15   Reyne Dumas, MD  KLOR-CON M10 10 MEQ tablet Take 10 mEq by mouth daily.  04/09/15   [provider]  naproxen (NAPROSYN) 500 MG tablet Take 500 mg by mouth every 12 (twelve) hours as needed for mild pain or moderate  pain.  04/20/15   [provider]  pyrazinamide 500 MG tablet Take 2 tablets (1,000 mg total) by mouth daily. Patient not taking: Reported on 05/20/2015 03/26/15   Reyne Dumas, MD  pyridOXINE (B-6) 50 MG tablet Take 1 tablet (50 mg total) by mouth daily. Patient not taking: Reported on 05/20/2015 03/26/15   Reyne Dumas, MD  rifampin (RIFADIN) 300 MG capsule Take 2 capsules (600 mg total) by mouth daily. Patient not taking: Reported on 05/20/2015 03/26/15   Reyne Dumas, MD    Family History Family History  Problem Relation Age of Onset   Hypertension Mother    Diabetes Father     Social History Social History   Tobacco Use   Smoking status: Never   Smokeless tobacco: Never  Substance Use Topics   Alcohol use: No   Drug use: No     Allergies   Patient has no known allergies.   Review of Systems Review of Systems  Constitutional: Negative.   HENT:  Positive for sinus pressure and sinus pain.   Respiratory: Negative.    Gastrointestinal:  Positive for vomiting.  Genitourinary: Negative.   Musculoskeletal: Negative.   Neurological:  Positive for headaches.  Hematological: Negative.   Psychiatric/Behavioral: Negative.       Physical Exam Triage Vital Signs ED Triage Vitals  Enc Vitals Group     BP 09/12/22 1107 (!) 136/92     Pulse Rate 09/12/22 1107 88     Resp 09/12/22 1107 16     Temp 09/12/22 1107 98.8 F (37.1 C)     Temp src --      SpO2 09/12/22 1107 98 %     Weight --      Height --      Head Circumference --      Peak Flow --      Pain Score 09/12/22 1104 8     Pain Loc --      Pain Edu? --      Excl. in [place]? --    No data found.  Updated Vital Signs BP (!) 136/92   Pulse 88   Temp 98.8 F (37.1 C)   Resp 16   SpO2 98%   Visual Acuity Right Eye Distance:   Left Eye Distance:   Bilateral Distance:    Right Eye Near:   Left Eye Near:    Bilateral Near:     Physical Exam Constitutional:      General: She is not in acute  distress.    Appearance: She is well-developed. She is ill-appearing.  HENT:  Head: Normocephalic and atraumatic.     Nose:     Right Sinus: Maxillary sinus tenderness present.     Left Sinus: Maxillary sinus tenderness present.     Mouth/Throat:     Mouth: Mucous membranes are moist.  Eyes:     Extraocular Movements: Extraocular movements intact.     Pupils: Pupils are equal, round, and reactive to light.  Cardiovascular:     Rate and Rhythm: Normal rate.     Heart sounds: Normal heart sounds.  Pulmonary:     Effort: Pulmonary effort is normal.  Musculoskeletal:        General: Normal range of motion.     Cervical back: Normal range of motion and neck supple.  Lymphadenopathy:     Cervical: No cervical adenopathy.  Skin:    General: Skin is warm.  Neurological:     Mental Status: She is alert and oriented to person, place, and time.  Psychiatric:        Mood and Affect: Mood normal.        Behavior: Behavior normal.      UC Treatments / Results  Labs (all labs ordered are listed, but only abnormal results are displayed) Labs Reviewed - No data to display  EKG   Radiology No results found.  Procedures Procedures (including critical care time)  Medications Ordered in UC Medications - No data to display  Initial Impression / Assessment and Plan / UC Course  I have reviewed the triage vital signs and the nursing notes.  Pertinent labs & imaging results that were available during my care of the patient were reviewed by me and considered in my medical decision making (see chart for details).   Final Clinical Impressions(s) / UC Diagnoses   Final diagnoses:  Acute nonintractable headache, unspecified headache type  Sinus pressure  Allergic rhinitis due to other allergic trigger, unspecified seasonality     Discharge Instructions      ??? ??????? ????? ???? ??? ?????? ??????? ???????. ??? ????? ???? ????????? ???????? ?? ??? ?????. ??? ???? ??? ?? ?????  ?????? ????? ?? ????? ?????? ?? ????? ??????? ?? ?????? ????? ?????? ??? ???? ??????? ?????? ???? ?? ??????? ?????????. tamat muqabalatuk alyawm bisabab daght aljuyub al'anfiat walsudaei. laqad 'ursilat huzmat alsitirwid lilmusaeadat fi hadha alyawmi. wamae dhalika, 'iidha lam yatahasan alsudae ladayka, 'aw tafaqum al'almi, 'aw tafaqum alghathayan 'aw alqay', fayurjaa WCHENIDP 'iilaa ghurfat altawari li'iijra' mazid min altaqyim walaikhtibari.  You were seen today for sinus pressurea and headache.  I have sent out a steroid pack to help with this today.  However, if your headache does not improve, or you develop worsening pain, worsening nausea or vomiting, then please go to the ER for further evaluation and testing.      ED Prescriptions     Medication Sig Dispense Auth. Provider   methylPREDNISolone (MEDROL DOSEPAK) 4 MG TBPK tablet Take as directed 1 each Rondel Oh, MD      PDMP not reviewed this encounter.   Rondel Oh, MD 09/12/22 1142    Rondel Oh, MD 09/12/22 1224

## 2023-05-19 ENCOUNTER — Ambulatory Visit: Payer: 59 | Admitting: Family Medicine

## 2023-07-28 ENCOUNTER — Ambulatory Visit (INDEPENDENT_AMBULATORY_CARE_PROVIDER_SITE_OTHER): Payer: 59 | Admitting: Family Medicine

## 2023-07-28 ENCOUNTER — Encounter: Payer: Self-pay | Admitting: Family Medicine

## 2023-07-28 VITALS — BP 136/84 | HR 80 | Temp 97.8°F | Ht 61.0 in | Wt 140.0 lb

## 2023-07-28 DIAGNOSIS — R202 Paresthesia of skin: Secondary | ICD-10-CM | POA: Diagnosis not present

## 2023-07-28 DIAGNOSIS — Z758 Other problems related to medical facilities and other health care: Secondary | ICD-10-CM

## 2023-07-28 DIAGNOSIS — R0981 Nasal congestion: Secondary | ICD-10-CM

## 2023-07-28 DIAGNOSIS — Z9109 Other allergy status, other than to drugs and biological substances: Secondary | ICD-10-CM

## 2023-07-28 DIAGNOSIS — E049 Nontoxic goiter, unspecified: Secondary | ICD-10-CM | POA: Diagnosis not present

## 2023-07-28 DIAGNOSIS — E559 Vitamin D deficiency, unspecified: Secondary | ICD-10-CM

## 2023-07-28 DIAGNOSIS — Z603 Acculturation difficulty: Secondary | ICD-10-CM

## 2023-07-28 DIAGNOSIS — R200 Anesthesia of skin: Secondary | ICD-10-CM | POA: Diagnosis not present

## 2023-07-28 DIAGNOSIS — Z124 Encounter for screening for malignant neoplasm of cervix: Secondary | ICD-10-CM

## 2023-07-28 LAB — COMPREHENSIVE METABOLIC PANEL
ALT: 11 U/L (ref 0–35)
AST: 16 U/L (ref 0–37)
Albumin: 4.2 g/dL (ref 3.5–5.2)
Alkaline Phosphatase: 75 U/L (ref 39–117)
BUN: 12 mg/dL (ref 6–23)
CO2: 28 mEq/L (ref 19–32)
Calcium: 9.6 mg/dL (ref 8.4–10.5)
Chloride: 99 mEq/L (ref 96–112)
Creatinine, Ser: 0.55 mg/dL (ref 0.40–1.20)
GFR: 105.98 mL/min (ref >60.00)
Glucose, Bld: 96 mg/dL (ref 70–99)
Potassium: 3.7 mEq/L (ref 3.5–5.1)
Sodium: 134 mEq/L — ABNORMAL LOW (ref 135–145)
Total Bilirubin: 0.2 mg/dL (ref 0.2–1.2)
Total Protein: 7.7 g/dL (ref 6.0–8.3)

## 2023-07-28 LAB — CBC WITH DIFFERENTIAL/PLATELET
Basophils Absolute: 0 10*3/uL (ref 0.0–0.1)
Basophils Relative: 0.8 % (ref 0.0–3.0)
Eosinophils Absolute: 0.1 10*3/uL (ref 0.0–0.7)
Eosinophils Relative: 1.4 % (ref 0.0–5.0)
HCT: 38.3 % (ref 36.0–46.0)
Hemoglobin: 12.4 g/dL (ref 12.0–15.0)
Lymphocytes Relative: 33.4 % (ref 12.0–46.0)
Lymphs Abs: 1.2 10*3/uL (ref 0.7–4.0)
MCHC: 32.5 g/dL (ref 30.0–36.0)
MCV: 86.4 fl (ref 78.0–100.0)
Monocytes Absolute: 0.3 10*3/uL (ref 0.1–1.0)
Monocytes Relative: 7.6 % (ref 3.0–12.0)
Neutro Abs: 2.1 10*3/uL (ref 1.4–7.7)
Neutrophils Relative %: 56.8 % (ref 43.0–77.0)
Platelets: 259 10*3/uL (ref 150.0–400.0)
RBC: 4.43 Mil/uL (ref 3.87–5.11)
RDW: 14.1 % (ref 11.5–15.5)
WBC: 3.7 10*3/uL — ABNORMAL LOW (ref 4.0–10.5)

## 2023-07-28 LAB — TSH: TSH: 0.7 u[IU]/mL (ref 0.35–5.50)

## 2023-07-28 LAB — VITAMIN B12: Vitamin B-12: 341 pg/mL (ref 211–911)

## 2023-07-28 LAB — T4, FREE: Free T4: 0.8 ng/dL (ref 0.60–1.60)

## 2023-07-28 LAB — FOLATE: Folate: 16.4 ng/mL (ref >5.9)

## 2023-07-28 LAB — VITAMIN D 25 HYDROXY (VIT D DEFICIENCY, FRACTURES): VITD: 24.72 ng/mL — ABNORMAL LOW (ref 30.00–100.00)

## 2023-07-28 MED ORDER — LORATADINE 10 MG PO TABS: 10.0000 mg | ORAL_TABLET | Freq: Every day | ORAL | 5 refills | Status: AC

## 2023-07-28 MED ORDER — FLUTICASONE PROPIONATE 50 MCG/ACT NA SUSP: 2.0000 | Freq: Every day | NASAL | 3 refills | Status: AC

## 2023-07-28 NOTE — Patient Instructions (Signed)
Please go downstairs for labs before you leave.  We will be in touch with your results and with recommendations.  Try taking an allergy medication and nasal spray.  I sent both prescriptions to your pharmacy.  Let me know if you are nasal congestion, runny nose and headaches are not improving.

## 2023-07-28 NOTE — Progress Notes (Unsigned)
New Patient Office Visit  Subjective    Patient ID: Nichole Perry, female    DOB: 01/04/1971  Age: 52 y.o. MRN: [id number]  CC:  Chief Complaint  Patient presents with   Establish Care    Would like to check thyroid.  Numbing on bottom of both feet mostly at night for 1 month.  Under right arm can sometimes be swollen randomly.     HPI Nichole Perry presents to establish care No PCP in years.   No specialists  C/o swelling in her neck around thyroid gland. Hx of thyroid biopsy in 2016  C/o intermittent swelling in her right axilla. Hx of benign biopsy of the area and a swollen lymph node.  Area is bothersome.    C/o nasal congestion and frontal headache for the past 3 years.  Sneezing and rhinorrhea at times.   LMP: 2 years ago   Overdue for   Works at [employer], stands for long hours     Outpatient Encounter Medications as of 07/28/2023  Medication Sig   fluticasone (FLONASE) 50 MCG/ACT nasal spray Place 2 sprays into both nostrils daily.   loratadine (CLARITIN) 10 MG tablet Take 1 tablet (10 mg total) by mouth daily.   VITAMIN D PO Take by mouth.   [DISCONTINUED] acetaminophen (TYLENOL) 500 MG tablet Take 500 mg by mouth every 6 (six) hours as needed for fever.   [DISCONTINUED] ethambutol (MYAMBUTOL) 100 MG tablet Take 8.5 tablets (850 mg total) by mouth daily. (Patient not taking: Reported on 05/20/2015)   [DISCONTINUED] ferrous sulfate 325 (65 FE) MG tablet Take 1 tablet (325 mg total) by mouth 2 (two) times daily with a meal.   [DISCONTINUED] furosemide (LASIX) 20 MG tablet Take 1 tablet (20 mg total) by mouth daily. (Patient not taking: Reported on 04/13/2018)   [DISCONTINUED] HYDROcodone-acetaminophen (NORCO/VICODIN) 5-325 MG per tablet Take 1-2 tablets by mouth every 6 (six) hours as needed for moderate pain or severe pain.    [DISCONTINUED] isoniazid (NYDRAZID) 300 MG tablet Take 1 tablet (300 mg total) by mouth daily. (Patient not taking: Reported on 05/20/2015)    [DISCONTINUED] KLOR-CON M10 10 MEQ tablet Take 10 mEq by mouth daily.    [DISCONTINUED] methylPREDNISolone (MEDROL DOSEPAK) 4 MG TBPK tablet Take as directed   [DISCONTINUED] naproxen (NAPROSYN) 500 MG tablet Take 500 mg by mouth every 12 (twelve) hours as needed for mild pain or moderate pain.    [DISCONTINUED] pyrazinamide 500 MG tablet Take 2 tablets (1,000 mg total) by mouth daily. (Patient not taking: Reported on 05/20/2015)   [DISCONTINUED] pyridOXINE (B-6) 50 MG tablet Take 1 tablet (50 mg total) by mouth daily. (Patient not taking: Reported on 05/20/2015)   [DISCONTINUED] rifampin (RIFADIN) 300 MG capsule Take 2 capsules (600 mg total) by mouth daily. (Patient not taking: Reported on 05/20/2015)   No facility-administered encounter medications on file as of 07/28/2023.    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    Past Surgical History:  Procedure Laterality Date   AXILLARY LYMPH NODE BIOPSY Right 03/20/2015   Procedure: RIGHT AXILLARY LYMPH NODE BIOPSY;  Surgeon: Ovidio Kin, MD;  Location: [facility] OR;  Service: General;  Laterality: Right;   BACK SURGERY  ~ 1983   "cartilage"    Family History  Problem Relation Age of Onset   Hypertension Mother    Diabetes Father     Social History   Socioeconomic History   Marital status: Married    Spouse name: Not on file   Number of children: 0   Years of education: Not on file   Highest education level: Not on file  Occupational History   Not on file  Tobacco Use   Smoking status: Never   Smokeless tobacco: Never  Substance and Sexual Activity   Alcohol use: No   Drug use: No   Sexual activity: Yes    Partners: Male    Birth control/protection: None  Other Topics Concern   Not on file  Social History  Narrative   From [place].   Social Determinants of Health   Financial Resource Strain: Not on file  Food Insecurity: Not on file  Transportation Needs: Not on file  Physical Activity: Not on file  Stress: Not on file  Social Connections: Not on file  Intimate Partner Violence: Not on file    ROS      Objective    BP 136/84 (BP Location: Left Arm, Patient Position: Sitting, Cuff Size: Large)   Pulse 80   Temp 97.8 F (36.6 C) (Temporal)   Ht 5\' 1"  (1.549 m)   Wt 140 lb (63.5 kg)   SpO2 98%   BMI 26.45 kg/m   Physical Exam  {Labs (Optional):23779}    Assessment & Plan:   Problem List Items Addressed This Visit       Endocrine   Goiter - Primary   Relevant Orders   TSH   T4, free     Other   Vitamin D deficiency   Relevant Orders   VITAMIN D 25 Hydroxy (Vit-D Deficiency, Fractures)   Other Visit Diagnoses     Nasal congestion       Relevant Medications   fluticasone (FLONASE) 50 MCG/ACT nasal spray   loratadine (CLARITIN) 10 MG tablet   Environmental allergies       Relevant Medications   fluticasone (FLONASE) 50 MCG/ACT nasal spray   loratadine (CLARITIN) 10 MG tablet   Numbness and tingling of both feet       Relevant Orders   CBC with Differential/Platelet   Comprehensive metabolic panel   Iron, TIBC and Ferritin Panel   Folate   Vitamin B12   Pap smear for cervical cancer screening       Relevant Orders   Ambulatory referral to Gynecology   Language barrier           Return in about 3 months (around 10/28/2023).   Hetty Blend, NP-C

## 2023-07-29 LAB — IRON,TIBC AND FERRITIN PANEL
%SAT: 18 % (ref 16–45)
Ferritin: 43 ng/mL (ref 16–232)
Iron: 51 ug/dL (ref 45–160)
TIBC: 279 mcg/dL (calc) (ref 250–450)

## 2023-07-31 NOTE — Progress Notes (Signed)
Her labs are fine except her vitamin D level is a little low. Please start taking an over the counter vitamin D3 1,000 IUs daily. I also recommend taking a Women's One A Day multivitamin daily. Her thyroid function is normal. Does she want to be referred to the endocrinologist for her enlarged thyroid gland? We can do this for the goiter if she would like.

## 2023-08-03 ENCOUNTER — Other Ambulatory Visit: Payer: Self-pay | Admitting: Family Medicine

## 2023-08-03 ENCOUNTER — Telehealth: Payer: Self-pay | Admitting: Family Medicine

## 2023-08-03 DIAGNOSIS — Z1231 Encounter for screening mammogram for malignant neoplasm of breast: Secondary | ICD-10-CM

## 2023-08-03 NOTE — Addendum Note (Signed)
Addended by: Marinus Maw on: 08/03/2023 01:24 PM   Modules accepted: Orders

## 2023-08-03 NOTE — Telephone Encounter (Signed)
Mammogram ordered

## 2023-08-03 NOTE — Telephone Encounter (Signed)
Called back husband and he reports you were going to place order for mammogram for pt. Are you ok with this?

## 2023-08-03 NOTE — Telephone Encounter (Signed)
Pt husband calling about pt mammogram it was very hard understanding him because he is arabic. Please advise

## 2023-10-31 ENCOUNTER — Ambulatory Visit (INDEPENDENT_AMBULATORY_CARE_PROVIDER_SITE_OTHER): Payer: 59 | Admitting: Family Medicine

## 2023-10-31 ENCOUNTER — Encounter: Payer: Self-pay | Admitting: Family Medicine

## 2023-10-31 VITALS — BP 122/80 | HR 76 | Temp 97.6°F | Ht 61.0 in | Wt 141.0 lb

## 2023-10-31 DIAGNOSIS — E559 Vitamin D deficiency, unspecified: Secondary | ICD-10-CM | POA: Diagnosis not present

## 2023-10-31 DIAGNOSIS — Z603 Acculturation difficulty: Secondary | ICD-10-CM

## 2023-10-31 DIAGNOSIS — E049 Nontoxic goiter, unspecified: Secondary | ICD-10-CM

## 2023-10-31 DIAGNOSIS — Z758 Other problems related to medical facilities and other health care: Secondary | ICD-10-CM

## 2023-10-31 DIAGNOSIS — Z9109 Other allergy status, other than to drugs and biological substances: Secondary | ICD-10-CM

## 2023-10-31 DIAGNOSIS — Z1211 Encounter for screening for malignant neoplasm of colon: Secondary | ICD-10-CM

## 2023-10-31 DIAGNOSIS — Z124 Encounter for screening for malignant neoplasm of cervix: Secondary | ICD-10-CM

## 2023-10-31 NOTE — Progress Notes (Signed)
Subjective:     Patient ID: Nichole Perry, female    DOB: [birth year], 52 y.o.   MRN: [id number]  Chief Complaint  Patient presents with   Medical Management of Chronic Issues    3 month f/u    HPI   History of Present Illness         Medical interpreter is present.   Here to follow up on chronic health conditions   Dizziness upon standing intermittently and infrequently.  She does not drink much water.  Denies dizziness today.  No headache, tinnitus, chest pain, palpitations, shortness of breath.  Vitamin D deficiency and she is taking a vitamin D supplement as well as a multivitamin.  Allergies have improved with Flonase and Claritin.  She is having thyroid tenderness when she wears a scarf or something around her neck.  She would like to see endocrinology.  She is overdue for Pap smear, mammogram and colonoscopy.  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    Past Surgical History:  Procedure Laterality Date   AXILLARY LYMPH NODE BIOPSY Right 03/20/2015   Procedure: RIGHT AXILLARY LYMPH NODE BIOPSY;  Surgeon: Ovidio Kin, MD;  Location: [facility] OR;  Service: General;  Laterality: Right;   BACK SURGERY  ~ 1983   "cartilage"    Family History  Problem Relation Age of Onset   Hypertension Mother    Diabetes Father     Social History   Socioeconomic History   Marital status: Married    Spouse name: Not on file   Number of children: 0   Years of education: Not on file   Highest education level: Not on file   Occupational History   Not on file  Tobacco Use   Smoking status: Never   Smokeless tobacco: Never  Substance and Sexual Activity   Alcohol use: No   Drug use: No   Sexual activity: Yes    Partners: Male    Birth control/protection: None  Other Topics Concern   Not on file  Social History Narrative   From [place].   Social Determinants of Health   Financial Resource Strain: Not on file  Food Insecurity: Not on file  Transportation Needs: Not on file  Physical Activity: Not on file  Stress: Not on file  Social Connections: Not on file  Intimate Partner Violence: Not on file    Outpatient Medications Prior to Visit  Medication Sig Dispense Refill   fluticasone (FLONASE) 50 MCG/ACT nasal spray Place 2 sprays into both nostrils daily. 16 g 3   loratadine (CLARITIN) 10 MG tablet Take 1 tablet (10 mg total) by mouth daily. 30 tablet 5   VITAMIN D PO Take by mouth.     No facility-administered medications prior to visit.    No Known Allergies  Review of Systems  Constitutional:  Negative for chills, fever and malaise/fatigue.  Eyes:  Negative for blurred vision, double vision and photophobia.  Respiratory:  Negative for shortness of breath.  Cardiovascular:  Negative for chest pain, palpitations and leg swelling.  Gastrointestinal:  Negative for abdominal pain, constipation, diarrhea, nausea and vomiting.  Genitourinary:  Negative for dysuria, frequency and urgency.  Neurological:  Positive for dizziness. Negative for focal weakness and headaches.       Objective:    Physical Exam Constitutional:      General: She is not in acute distress.    Appearance: She is not ill-appearing.  Eyes:     Extraocular Movements: Extraocular movements intact.     Conjunctiva/sclera: Conjunctivae normal.  Cardiovascular:     Rate and Rhythm: Normal rate.  Pulmonary:     Effort: Pulmonary effort is normal.  Musculoskeletal:     Cervical back: Normal range of motion and neck  supple.  Skin:    General: Skin is warm and dry.  Neurological:     General: No focal deficit present.     Mental Status: She is alert and oriented to person, place, and time.     Cranial Nerves: No cranial nerve deficit.     Motor: No weakness.     Coordination: Coordination normal.     Gait: Gait normal.  Psychiatric:        Mood and Affect: Mood normal.        Behavior: Behavior normal.        Thought Content: Thought content normal.      BP 122/80 (BP Location: Left Arm, Patient Position: Sitting, Cuff Size: Large)   Pulse 76   Temp 97.6 F (36.4 C) (Temporal)   Ht 5\' 1"  (1.549 m)   Wt 141 lb (64 kg)   SpO2 98%   BMI 26.64 kg/m  Wt Readings from Last 3 Encounters:  10/31/23 141 lb (64 kg)  07/28/23 140 lb (63.5 kg)  04/13/18 132 lb 3.2 oz (60 kg)       Assessment & Plan:   Problem List Items Addressed This Visit     Goiter - Primary   Relevant Orders   Ambulatory referral to Endocrinology   Vitamin D deficiency   Other Visit Diagnoses     Environmental allergies       Pap smear for cervical cancer screening       Relevant Orders   Ambulatory referral to Gynecology   Language barrier       Screen for colon cancer       Relevant Orders   Ambulatory referral to Gastroenterology      Visit conducted in the presence of a medical interpreter. Referral to gynecology for Pap smear.  I placed an order for mammogram but she did not get this scheduled.  She will discuss with gynecologist.  Perhaps she can see a gynecologist that also has mammography in their office. Referral to [facility] gastroenterology for her for screening colonoscopy.  Asymptomatic. Reviewed labs with patient including normal thyroid blood test.  She would like to see endocrinology due to discomfort when she is wearing something tight like a scarf around her neck.  Otherwise asymptomatic. Continue with current allergy treatment but take Claritin in the evening Continue vitamin D and  multivitamin.  Visit time 22 minutes in face to face communication with patient and coordination of care, additional 8 minutes spent in record review, coordination or care, ordering tests, communicating/referring to other healthcare professionals, documenting in medical records all on the same day of the visit for total time 30 minutes spent on the visit.     I am having [facility] maintain  her VITAMIN D PO, fluticasone, and loratadine.  No orders of the defined types were placed in this encounter.

## 2023-10-31 NOTE — Patient Instructions (Signed)
I have placed referrals to gynecology, [place] gastroenterology and [facility] endocrinology.  You should receive calls from all 3 of these places to schedule visits.  Increase water intake and take Claritin at bedtime.  Continue using Flonase as needed for allergy symptoms.

## 2023-11-28 ENCOUNTER — Other Ambulatory Visit: Payer: Self-pay | Admitting: Nurse Practitioner

## 2023-11-28 DIAGNOSIS — E042 Nontoxic multinodular goiter: Secondary | ICD-10-CM

## 2023-12-20 ENCOUNTER — Ambulatory Visit: Payer: 59

## 2023-12-21 ENCOUNTER — Ambulatory Visit
Admission: RE | Admit: 2023-12-21 | Discharge: 2023-12-21 | Disposition: A | Discharge status: Home / Self Care | Payer: 59 | Source: Ambulatory Visit | Attending: Family Medicine | Admitting: Family Medicine

## 2023-12-21 DIAGNOSIS — Z1231 Encounter for screening mammogram for malignant neoplasm of breast: Secondary | ICD-10-CM

## 2024-01-31 ENCOUNTER — Other Ambulatory Visit: Payer: Self-pay | Admitting: Surgery

## 2024-01-31 DIAGNOSIS — E042 Nontoxic multinodular goiter: Secondary | ICD-10-CM

## 2024-02-05 ENCOUNTER — Encounter: Payer: Self-pay | Admitting: Family Medicine

## 2024-02-09 ENCOUNTER — Ambulatory Visit
Admission: RE | Admit: 2024-02-09 | Discharge: 2024-02-09 | Disposition: A | Discharge status: Home / Self Care | Payer: 59 | Source: Ambulatory Visit | Attending: Surgery | Admitting: Surgery

## 2024-02-09 DIAGNOSIS — E042 Nontoxic multinodular goiter: Secondary | ICD-10-CM

## 2024-02-14 ENCOUNTER — Ambulatory Visit: Payer: Self-pay | Admitting: Surgery

## 2024-02-14 NOTE — H&P (Signed)
 REFERRING PHYSICIAN: Dennie Maizes, NP  PROVIDER: Cypress Hinkson Myra Rude, MD   Chief Complaint: New Consultation (Multinodular thyroid goiter)  History of Present Illness:  Patient is referred by Dennie Maizes, NP, for surgical evaluation and recommendations regarding an enlarging multinodular thyroid goiter with compressive symptoms. Patient was evaluated in 2016. She had an ultrasound and a chest CT scan. She was noted to have an enlarged thyroid gland with the right lobe measuring 5.8 cm and left lobe measuring 7.1 cm. There was a dominant nodule in the left lobe measuring 4.7 cm. Fine-needle aspiration biopsy was performed with benign cytopathology. Recent TSH level is normal at 0.7. Patient has been scheduled for a follow-up ultrasound which has not yet been performed. Patient has developed compressive symptoms. She notes shortness of breath while lying recumbent. She is having some discomfort in the neck. She denies any significant dysphagia. She has never been on thyroid medication. She has never had any head or neck surgery. There is no family history of thyroid disease. She is accompanied today by her husband. She works at [employer].  Review of Systems: A complete review of systems was obtained from the patient. I have reviewed this information and discussed as appropriate with the patient. See HPI as well for other ROS.  Review of Systems  Constitutional: Negative.  HENT: Negative.  Eyes: Negative.  Respiratory: Positive for shortness of breath.  Cardiovascular: Negative.  Gastrointestinal: Negative.  Denies dysphagia  Genitourinary: Negative.  Musculoskeletal: Positive for neck pain.  Skin: Negative.  Neurological: Negative.  Endo/Heme/Allergies: Negative.  Psychiatric/Behavioral: Negative.    Medical History: Past Medical History:  Diagnosis Date  Anemia   Patient Active Problem List  Diagnosis  Multiple thyroid nodules  Enlarged thyroid gland   History  reviewed. No pertinent surgical history.   No Known Allergies  Current Outpatient Medications on File Prior to Visit  Medication Sig Dispense Refill  fluticasone propionate (FLONASE) 50 mcg/actuation nasal spray Place 2 sprays into one nostril once daily  cholecalciferol, vitamin D3, (CHOLECALCIFEROL, VIT D3,,BULK,) 100,000 unit/gram Powd Take by mouth   No current facility-administered medications on file prior to visit.   History reviewed. No pertinent family history.   Social History   Tobacco Use  Smoking Status Never  Smokeless Tobacco Never    Social History   Socioeconomic History  Marital status: Married  Tobacco Use  Smoking status: Never  Smokeless tobacco: Never  Substance and Sexual Activity  Alcohol use: Not Currently  Drug use: Never   Social Drivers of Health   Housing Stability: Unknown (01/31/2024)  Housing Stability Vital Sign  Homeless in the Last Year: No   Objective:   Vitals:  BP: 131/85  Pulse: 88  Temp: 36.1 C (97 F)  SpO2: 98%  Weight: 63.5 kg (140 lb)  Height: 157.5 cm (5\' 2" )   Body mass index is 25.61 kg/m.  Physical Exam   GENERAL APPEARANCE Comfortable, no acute issues Development: normal Gross deformities: none  SKIN Rash, lesions, ulcers: none Induration, erythema: none Nodules: none palpable  EYES Conjunctiva and lids: normal Pupils: equal  EARS, NOSE, MOUTH, THROAT External nose: no lesion or deformity Hearing: grossly normal  NECK Symmetric: no Trachea: deviation to the right Thyroid: Thyroid gland is quite firm and multinodular, larger on the left than on the right, nontender, no obvious lymphadenopathy.  CHEST/CV Not assessed  ABDOMEN Not assessed  GENITOURINARY/RECTAL Not assessed  MUSCULOSKELETAL Station and gait: normal Digits and nails: no clubbing or cyanosis Muscle  strength: grossly normal all extremities Deformity: none  LYMPHATIC Cervical: none palpable Supraclavicular: none  palpable  PSYCHIATRIC Oriented to person, place, and time: yes Mood and affect: normal for situation Judgment and insight: appropriate for situation   Assessment and Plan:   Multiple thyroid nodules Enlarged thyroid gland  Patient is referred by her endocrinologist for surgical evaluation and management of an enlarging multinodular thyroid goiter with compressive symptoms.  Patient provided with a copy of "The Thyroid Book: Medical and Surgical Treatment of Thyroid Problems", published by [employer], 16 pages. Book reviewed and explained to patient during visit today.  The patient's husband acted as Nurse, learning disability when necessary during today's office visit. They were offered a translator but refused.  Today we reviewed her clinical history. We reviewed her prior studies. We discussed the compressive symptoms that she has developed. We discussed performing total thyroidectomy. We discussed the risk and benefits of the procedure including the risk of recurrent laryngeal nerve injury and injury to parathyroid glands. We discussed the size and location of the surgical incision. We discussed the hospital stay to be anticipated. We discussed the time out of work. We discussed the need for lifelong thyroid hormone replacement. The patient and her husband understand and wish to proceed. They would like to wait to have the surgery until after Ramadan.  I would like to obtain a follow-up ultrasound for comparison to her study 9 years ago. We will make arrangements for this study in the near future. Once the results are reviewed, we will enter orders with our schedulers to contact the patient and select a date for surgery.   Darnell Level, MD [facility] practice Office: [phone]

## 2024-02-14 NOTE — Progress Notes (Signed)
 USN is reviewed.  All nodules have enlarged in the interim since her last USN.  Will plan to proceed with total thyroidectomy as discussed in the office.  Patient would like to wait to schedule her surgery after Ramadan.  I think that will be fine.  Will enter orders and send to schedulers to contact patient.  Darnell Level, MD [facility] practice Office: [phone]

## 2024-10-11 ENCOUNTER — Encounter (HOSPITAL_COMMUNITY): Payer: Self-pay

## 2024-10-11 ENCOUNTER — Ambulatory Visit (HOSPITAL_COMMUNITY)
Admission: EM | Admit: 2024-10-11 | Discharge: 2024-10-11 | Disposition: A | Discharge status: Home / Self Care | Attending: Internal Medicine | Admitting: Internal Medicine

## 2024-10-11 DIAGNOSIS — L03213 Periorbital cellulitis: Secondary | ICD-10-CM

## 2024-10-11 MED ORDER — AMOXICILLIN-POT CLAVULANATE 875-125 MG PO TABS: 1.0000 | ORAL_TABLET | Freq: Two times a day (BID) | ORAL | 0 refills | Status: AC

## 2024-10-11 NOTE — ED Provider Notes (Signed)
 [facility]    CSN: [id number] Arrival date & time: 10/11/24  1348      History   Chief Complaint Chief Complaint  Patient presents with   Facial Swelling    HPI Nichole Perry is a 53 y.o. female.   53 year old female who presents urgent care with complaints of left eye pain and swelling.  This is around her eye and extending down into her cheek.  She denies any visual changes.  She for started noticing the symptoms about 2 to 3 days ago and they have worsened.  She has some watering of her eye but no discharge.  She denies any fevers or chills.  The area is tender around the eye itself.  She has not had any recent illnesses.     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   Effusion into joint    Extrapulmonary tuberculosis    Elevated d-dimer    SOB (shortness of breath)    Tuberculosis    Bacteremia    Knee effusion    Pleural effusion    Fever 03/14/2015   Coagulase negative Staphylococcus bacteremia 03/14/2015   Sepsis (HCC) 03/14/2015   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   Bilateral pleural effusion 03/07/2015   Arthralgia 03/07/2015   Amenorrhea 03/07/2015   Thyroid  nodule 03/07/2015   LAD (lymphadenopathy), axillary 03/07/2015   Bilateral edema of lower extremity 03/07/2015   Infertility, female 05/03/2013   Other and unspecified ovarian cyst 05/03/2013   Goiter 02/07/2012   Vitamin D  deficiency 02/07/2012    Leucopenia 02/07/2012    Past Surgical History:  Procedure Laterality Date   AXILLARY LYMPH NODE BIOPSY Right 03/20/2015   Procedure: RIGHT AXILLARY LYMPH NODE BIOPSY;  Surgeon: Alm Angle, MD;  Location: [facility];  Service: General;  Laterality: Right;   BACK SURGERY  ~ 1983   cartilage    OB History     Gravida  0   Para  0   Term  0   Preterm  0   AB  0   Living  0      SAB  0   IAB  0   Ectopic  0   Multiple  0   Live Births               Home Medications    Prior to Admission medications   Medication Sig Start Date End Date Taking? Authorizing Provider  amoxicillin-clavulanate (AUGMENTIN) 875-125 MG tablet Take 1 tablet by mouth every 12 (twelve) hours for 10 days. 10/11/24 10/21/24 Yes Xian Apostol A, PA-C  fluticasone  (FLONASE ) 50 MCG/ACT nasal spray Place 2 sprays into both nostrils daily. 07/28/23   Henson, Vickie L, NP-C  loratadine  (CLARITIN ) 10 MG tablet Take 1 tablet (10 mg total) by mouth daily. 07/28/23   Henson, Vickie L, NP-C  VITAMIN D  PO Take by mouth.    [provider]    Family History Family History  Problem Relation Age of  Onset   Hypertension Mother    Diabetes Father     Social History Social History   Tobacco Use   Smoking status: Never   Smokeless tobacco: Never  Substance Use Topics   Alcohol use: No   Drug use: No     Allergies   Patient has no known allergies.   Review of Systems Review of Systems  Constitutional:  Negative for chills and fever.  HENT:  Negative for ear pain and sore throat.   Eyes:  Positive for pain (around the eye) and redness (around the eye). Negative for visual disturbance.  Respiratory:  Negative for cough and shortness of breath.   Cardiovascular:  Negative for chest pain and palpitations.  Gastrointestinal:  Negative for abdominal pain and vomiting.  Genitourinary:  Negative for dysuria and hematuria.  Musculoskeletal:  Negative for arthralgias and back pain.  Skin:   Negative for color change and rash.  Neurological:  Negative for seizures and syncope.  All other systems reviewed and are negative.    Physical Exam Triage Vital Signs ED Triage Vitals  Encounter Vitals Group     BP 10/11/24 1447 118/89     Girls Systolic BP Percentile --      Girls Diastolic BP Percentile --      Boys Systolic BP Percentile --      Boys Diastolic BP Percentile --      Pulse Rate 10/11/24 1447 96     Resp 10/11/24 1447 16     Temp 10/11/24 1447 98.1 F (36.7 C)     Temp Source 10/11/24 1447 Oral     SpO2 10/11/24 1447 98 %     Weight --      Height --      Head Circumference --      Peak Flow --      Pain Score 10/11/24 1445 2     Pain Loc --      Pain Education --      Exclude from Growth Chart --    No data found.  Updated Vital Signs BP 118/89 (BP Location: Left Arm)   Pulse 96   Temp 98.1 F (36.7 C) (Oral)   Resp 16   SpO2 98%   Visual Acuity Right Eye Distance:   Left Eye Distance:   Bilateral Distance:    Right Eye Near:   Left Eye Near:    Bilateral Near:     Physical Exam Vitals and nursing note reviewed.  Constitutional:      General: She is not in acute distress.    Appearance: She is well-developed.  HENT:     Head: Normocephalic and atraumatic.  Eyes:     Extraocular Movements:     Right eye: Normal extraocular motion.     Left eye: Normal extraocular motion.     Conjunctiva/sclera: Conjunctivae normal.     Right eye: Right conjunctiva is not injected.     Left eye: Left conjunctiva is not injected.     Pupils: Pupils are equal, round, and reactive to light.     Comments: Erythema and swelling present around the eye extending down to the cheek level.  Cardiovascular:     Rate and Rhythm: Normal rate and regular rhythm.     Heart sounds: No murmur heard. Pulmonary:     Effort: Pulmonary effort is normal. No respiratory distress.     Breath sounds: Normal breath sounds.  Abdominal:     Palpations: Abdomen is soft.  Tenderness: There is no abdominal tenderness.  Musculoskeletal:        General: No swelling.     Cervical back: Neck supple.  Skin:    General: Skin is warm and dry.     Capillary Refill: Capillary refill takes less than 2 seconds.  Neurological:     Mental Status: She is alert.  Psychiatric:        Mood and Affect: Mood normal.      UC Treatments / Results  Labs (all labs ordered are listed, but only abnormal results are displayed) Labs Reviewed - No data to display  EKG   Radiology No results found.  Procedures Procedures (including critical care time)  Medications Ordered in UC Medications - No data to display  Initial Impression / Assessment and Plan / UC Course  I have reviewed the triage vital signs and the nursing notes.  Pertinent labs & imaging results that were available during my care of the patient were reviewed by me and considered in my medical decision making (see chart for details).     Preseptal cellulitis of left eye   Symptoms and physical exam findings are most consistent with left preseptal cellulitis.  This is a soft tissue infection around the eye.  This is treated with antibiotics by mouth.  Will treat with the following: Augmentin 875 mg twice daily for 10 days.  This is an antibiotic.  Take this with food. May alternate Tylenol  and ibuprofen  for fever or pain Make sure to stay hydrated by drinking plenty of water. Return to urgent care if your symptoms worsen while on the antibiotics or you develop fevers, loss of vision, increased swelling, severe headache  Final Clinical Impressions(s) / UC Diagnoses   Final diagnoses:  Preseptal cellulitis of left eye     Discharge Instructions      Symptoms and physical exam findings are most consistent with left preseptal cellulitis.  This is a soft tissue infection around the eye.  This is treated with antibiotics by mouth.  Will treat with the following: Augmentin 875 mg twice daily for  10 days.  This is an antibiotic.  Take this with food. May alternate Tylenol  and ibuprofen  for fever or pain Make sure to stay hydrated by drinking plenty of water. Return to urgent care if your symptoms worsen while on the antibiotics or you develop fevers, loss of vision, increased swelling, severe headache    ED Prescriptions     Medication Sig Dispense Auth. Provider   amoxicillin-clavulanate (AUGMENTIN) 875-125 MG tablet Take 1 tablet by mouth every 12 (twelve) hours for 10 days. 20 tablet Teresa Almarie LABOR, [place]      PDMP not reviewed this encounter.   Teresa Almarie LABOR, [place] 10/11/24 1511

## 2024-10-11 NOTE — Discharge Instructions (Addendum)
 Symptoms and physical exam findings are most consistent with left preseptal cellulitis.  This is a soft tissue infection around the eye.  This is treated with antibiotics by mouth.  Will treat with the following: Augmentin 875 mg twice daily for 10 days.  This is an antibiotic.  Take this with food. May alternate Tylenol  and ibuprofen  for fever or pain Make sure to stay hydrated by drinking plenty of water. Return to urgent care if your symptoms worsen while on the antibiotics or you develop fevers, loss of vision, increased swelling, severe headache

## 2024-10-11 NOTE — ED Triage Notes (Signed)
 Pt states via interpretor 140123 left sided facial swelling for the past 2 days.  States she has sinus problems.

## 2024-11-03 ENCOUNTER — Encounter (HOSPITAL_COMMUNITY): Payer: Self-pay

## 2024-11-03 ENCOUNTER — Ambulatory Visit (HOSPITAL_COMMUNITY)
Admission: EM | Admit: 2024-11-03 | Discharge: 2024-11-03 | Disposition: A | Discharge status: Home / Self Care | Attending: Family Medicine | Admitting: Family Medicine

## 2024-11-03 DIAGNOSIS — M5432 Sciatica, left side: Secondary | ICD-10-CM | POA: Diagnosis not present

## 2024-11-03 LAB — POCT URINE DIPSTICK
Bilirubin, UA: NEGATIVE
Glucose, UA: NEGATIVE mg/dL
Ketones, POC UA: NEGATIVE mg/dL
Leukocytes, UA: NEGATIVE
Nitrite, UA: NEGATIVE
POC PROTEIN,UA: NEGATIVE
Spec Grav, UA: 1.02 (ref 1.010–1.025)
Urobilinogen, UA: 0.2 U/dL
pH, UA: 5.5 (ref 5.0–8.0)

## 2024-11-03 MED ORDER — BACLOFEN 10 MG PO TABS: 10.0000 mg | ORAL_TABLET | Freq: Three times a day (TID) | ORAL | 0 refills | Status: AC

## 2024-11-03 MED ORDER — DICLOFENAC SODIUM 50 MG PO TBEC: 50.0000 mg | DELAYED_RELEASE_TABLET | Freq: Two times a day (BID) | ORAL | 1 refills | Status: AC

## 2024-11-03 MED ORDER — KETOROLAC TROMETHAMINE 30 MG/ML IJ SOLN
30.0000 mg | Freq: Once | INTRAMUSCULAR | Status: AC
  Administered 2024-11-03: 30 mg via INTRAMUSCULAR

## 2024-11-03 MED ORDER — KETOROLAC TROMETHAMINE 30 MG/ML IJ SOLN
INTRAMUSCULAR | Status: AC
  Filled 2024-11-03: qty 1

## 2024-11-03 NOTE — ED Provider Notes (Signed)
 [facility]  Note:  This document was prepared using Conservation officer, historic buildings and may include unintentional dictation errors.  MRN: [id number] DOB: June 11, 1971  Subjective:   Nichole Perry is a 53 y.o. female presenting for evaluation of left lower back pain with radiation to the left lower abdomen and down the left leg x 2 days.  Patient denies any known trauma or injury to the back or leg.  Patient has not taken any over-the-counter medication to treat symptoms prior to arrival in urgent care.  Patient denies any past history of lumbar pain or sciatica.  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  No current facility-administered medications for this encounter.  Current Outpatient Medications:    baclofen  (LIORESAL ) 10 MG tablet, Take 1 tablet (10 mg total) by mouth 3 (three) times daily., Disp: 30 each, Rfl: 0   diclofenac  (VOLTAREN ) 50 MG EC tablet, Take 1 tablet (50 mg total) by mouth 2 (two) times daily., Disp: 30 tablet, Rfl: 1   fluticasone  (FLONASE ) 50 MCG/ACT nasal spray, Place 2 sprays into both nostrils daily., Disp: 16 g, Rfl: 3   loratadine  (CLARITIN ) 10 MG tablet, Take 1 tablet (10 mg total) by mouth daily., Disp: 30 tablet, Rfl: 5   VITAMIN D  PO, Take by mouth., Disp: , Rfl:    No Known Allergies  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  Objective:    Vitals: BP (!) 149/86 (BP Location: Left Arm)   Pulse 81   Temp 98 F (36.7 C) (Oral)   Resp 18   SpO2 98%   Physical Exam Vitals and nursing note reviewed.  Constitutional:      General: She is not in acute distress.    Appearance: She is well-developed. She is not ill-appearing or toxic-appearing.  HENT:     Head: Normocephalic and atraumatic.  Cardiovascular:     Rate and Rhythm: Normal rate.  Pulmonary:     Effort: Pulmonary effort is normal. No respiratory distress.  Musculoskeletal:     Lumbar back: Spasms and tenderness present. No swelling or bony tenderness. Decreased range of motion. Positive left straight leg raise test. Negative right straight leg raise test.  Skin:    General: Skin is warm and dry.  Neurological:     General: No focal deficit present.     Mental Status: She is alert and oriented to person, place, and time.  Psychiatric:        Mood and Affect:  Mood normal.        Behavior: Behavior normal.     Procedures  Results for orders placed or performed during the hospital encounter of 11/03/24 (from the past 24 hours)  POC Urinalysis Dipstick     Status: Abnormal   Collection Time: 11/03/24  8:10 PM  Result Value Ref Range   Color, UA yellow yellow   Clarity, UA clear clear   Glucose, UA negative negative mg/dL   Bilirubin, UA negative negative   Ketones, POC UA negative negative mg/dL   Spec Grav, UA 8.979 8.989 - 1.025   Blood, UA trace-intact (A) negative   pH, UA 5.5 5.0 - 8.0   POC PROTEIN,UA negative negative, trace   Urobilinogen, UA 0.2 0.2 or 1.0 E.U./dL   Nitrite, UA Negative Negative   Leukocytes, UA Negative Negative    Assessment  and Plan :     Discharge Instructions       1. Sciatica of left side (Primary) - POC Urinalysis Dipstick shows trace blood, no leukocytes, no nitrite, these findings are possibly indicative of a kidney stone if symptoms persist despite current medication therapy follow-up in ER for further evaluation and treatment. - ketorolac  (TORADOL ) 30 MG/ML injection 30 mg given to patient in UC for acute left-sided lower back pain with sciatica - diclofenac  (VOLTAREN ) 50 MG EC tablet; Take 1 tablet (50 mg total) by mouth 2 (two) times daily.  Dispense: 30 tablet; Refill: 1 - baclofen  (LIORESAL ) 10 MG tablet; Take 1 tablet (10 mg total) by mouth 3 (three) times daily.  Dispense: 30 each; Refill: 0 - AMB referral to orthopedics for follow-up evaluation and ongoing management of left-sided lower back pain with radiculopathy.  -Continue to monitor symptoms for any change in severity if there is any escalation of current symptoms or development of new symptoms follow-up in ER for further evaluation and management.      Jessalyn Hinojosa B Kruz Chiu   Bralen W[place], [place], [place] 11/03/24 2021

## 2024-11-03 NOTE — ED Triage Notes (Signed)
 Patient states via interpreter lower left side back pain x 2 days. Patient reports no injuries or trauma.

## 2024-11-03 NOTE — Discharge Instructions (Addendum)
  1. Sciatica of left side (Primary) - POC Urinalysis Dipstick shows trace blood, no leukocytes, no nitrite, these findings are possibly indicative of a kidney stone if symptoms persist despite current medication therapy follow-up in ER for further evaluation and treatment. - ketorolac  (TORADOL ) 30 MG/ML injection 30 mg given to patient in UC for acute left-sided lower back pain with sciatica - diclofenac  (VOLTAREN ) 50 MG EC tablet; Take 1 tablet (50 mg total) by mouth 2 (two) times daily.  Dispense: 30 tablet; Refill: 1 - baclofen  (LIORESAL ) 10 MG tablet; Take 1 tablet (10 mg total) by mouth 3 (three) times daily.  Dispense: 30 each; Refill: 0 - AMB referral to orthopedics for follow-up evaluation and ongoing management of left-sided lower back pain with radiculopathy.  -Continue to monitor symptoms for any change in severity if there is any escalation of current symptoms or development of new symptoms follow-up in ER for further evaluation and management.
